# Patient Record
Sex: Female | Born: 1935 | Race: White | Hispanic: No | Marital: Single | Smoking: Former smoker
Health system: Southern US, Community
[De-identification: ages and names within clinical notes are randomized; demographics above are authoritative.]

## PROBLEM LIST (undated history)

## (undated) DIAGNOSIS — I272 Pulmonary hypertension, unspecified: Secondary | ICD-10-CM

## (undated) DIAGNOSIS — E039 Hypothyroidism, unspecified: Secondary | ICD-10-CM

## (undated) DIAGNOSIS — I4891 Unspecified atrial fibrillation: Secondary | ICD-10-CM

## (undated) DIAGNOSIS — R011 Cardiac murmur, unspecified: Secondary | ICD-10-CM

## (undated) DIAGNOSIS — M1990 Unspecified osteoarthritis, unspecified site: Secondary | ICD-10-CM

## (undated) DIAGNOSIS — R0600 Dyspnea, unspecified: Secondary | ICD-10-CM

## (undated) DIAGNOSIS — I071 Rheumatic tricuspid insufficiency: Secondary | ICD-10-CM

## (undated) DIAGNOSIS — G43909 Migraine, unspecified, not intractable, without status migrainosus: Secondary | ICD-10-CM

## (undated) DIAGNOSIS — I517 Cardiomegaly: Secondary | ICD-10-CM

## (undated) DIAGNOSIS — I679 Cerebrovascular disease, unspecified: Secondary | ICD-10-CM

## (undated) DIAGNOSIS — N183 Chronic kidney disease, stage 3 unspecified: Secondary | ICD-10-CM

## (undated) DIAGNOSIS — I5032 Chronic diastolic (congestive) heart failure: Secondary | ICD-10-CM

## (undated) DIAGNOSIS — I10 Essential (primary) hypertension: Secondary | ICD-10-CM

## (undated) DIAGNOSIS — G319 Degenerative disease of nervous system, unspecified: Secondary | ICD-10-CM

## (undated) DIAGNOSIS — I059 Rheumatic mitral valve disease, unspecified: Secondary | ICD-10-CM

## (undated) DIAGNOSIS — K219 Gastro-esophageal reflux disease without esophagitis: Secondary | ICD-10-CM

## (undated) DIAGNOSIS — R32 Unspecified urinary incontinence: Secondary | ICD-10-CM

## (undated) DIAGNOSIS — I340 Nonrheumatic mitral (valve) insufficiency: Secondary | ICD-10-CM

## (undated) DIAGNOSIS — E538 Deficiency of other specified B group vitamins: Secondary | ICD-10-CM

## (undated) DIAGNOSIS — R609 Edema, unspecified: Secondary | ICD-10-CM

## (undated) DIAGNOSIS — R2681 Unsteadiness on feet: Secondary | ICD-10-CM

## (undated) DIAGNOSIS — J449 Chronic obstructive pulmonary disease, unspecified: Secondary | ICD-10-CM

## (undated) DIAGNOSIS — I351 Nonrheumatic aortic (valve) insufficiency: Secondary | ICD-10-CM

## (undated) HISTORY — DX: Chronic kidney disease, stage 3 unspecified: N18.30

## (undated) HISTORY — DX: Rheumatic tricuspid insufficiency: I07.1

## (undated) HISTORY — DX: Edema, unspecified: R60.9

## (undated) HISTORY — DX: Chronic kidney disease, stage 3 (moderate): N18.3

## (undated) HISTORY — DX: Cardiac murmur, unspecified: R01.1

## (undated) HISTORY — DX: Unspecified osteoarthritis, unspecified site: M19.90

## (undated) HISTORY — DX: Degenerative disease of nervous system, unspecified: G31.9

## (undated) HISTORY — DX: Deficiency of other specified B group vitamins: E53.8

## (undated) HISTORY — DX: Cardiomegaly: I51.7

## (undated) HISTORY — PX: TONSILLECTOMY: SHX5217

## (undated) HISTORY — DX: Essential (primary) hypertension: I10

## (undated) HISTORY — DX: Unspecified urinary incontinence: R32

## (undated) HISTORY — DX: Migraine, unspecified, not intractable, without status migrainosus: G43.909

## (undated) HISTORY — DX: Rheumatic mitral valve disease, unspecified: I05.9

## (undated) HISTORY — DX: Unspecified atrial fibrillation: I48.91

## (undated) HISTORY — DX: Nonrheumatic aortic (valve) insufficiency: I35.1

## (undated) HISTORY — DX: Cerebrovascular disease, unspecified: I67.9

## (undated) HISTORY — DX: Hypothyroidism, unspecified: E03.9

## (undated) HISTORY — DX: Nonrheumatic mitral (valve) insufficiency: I34.0

## (undated) HISTORY — DX: Chronic obstructive pulmonary disease, unspecified: J44.9

## (undated) HISTORY — DX: Chronic diastolic (congestive) heart failure: I50.32

## (undated) HISTORY — DX: Pulmonary hypertension, unspecified: I27.20

## (undated) HISTORY — DX: Unsteadiness on feet: R26.81

## (undated) HISTORY — DX: Gastro-esophageal reflux disease without esophagitis: K21.9

## (undated) HISTORY — DX: Dyspnea, unspecified: R06.00

---

## 1995-09-07 ENCOUNTER — Encounter (INDEPENDENT_AMBULATORY_CARE_PROVIDER_SITE_OTHER): Payer: Self-pay | Admitting: *Deleted

## 1995-09-13 ENCOUNTER — Encounter (INDEPENDENT_AMBULATORY_CARE_PROVIDER_SITE_OTHER): Payer: Self-pay | Admitting: *Deleted

## 1996-03-26 ENCOUNTER — Encounter (INDEPENDENT_AMBULATORY_CARE_PROVIDER_SITE_OTHER): Payer: Self-pay | Admitting: *Deleted

## 1999-06-20 ENCOUNTER — Encounter (INDEPENDENT_AMBULATORY_CARE_PROVIDER_SITE_OTHER): Payer: Self-pay | Admitting: *Deleted

## 2000-08-10 LAB — CONVERTED CEMR LAB: Pap Smear: NORMAL

## 2002-06-20 ENCOUNTER — Encounter (INDEPENDENT_AMBULATORY_CARE_PROVIDER_SITE_OTHER): Payer: Self-pay | Admitting: *Deleted

## 2006-05-29 ENCOUNTER — Encounter: Admission: RE | Admit: 2006-05-29 | Discharge: 2006-05-29 | Payer: Self-pay | Admitting: Family Medicine

## 2007-04-05 ENCOUNTER — Encounter (INDEPENDENT_AMBULATORY_CARE_PROVIDER_SITE_OTHER): Payer: Self-pay | Admitting: *Deleted

## 2007-04-10 ENCOUNTER — Encounter (INDEPENDENT_AMBULATORY_CARE_PROVIDER_SITE_OTHER): Payer: Self-pay | Admitting: *Deleted

## 2007-08-08 ENCOUNTER — Encounter (INDEPENDENT_AMBULATORY_CARE_PROVIDER_SITE_OTHER): Payer: Self-pay | Admitting: *Deleted

## 2007-08-24 ENCOUNTER — Encounter (INDEPENDENT_AMBULATORY_CARE_PROVIDER_SITE_OTHER): Payer: Self-pay | Admitting: *Deleted

## 2007-09-10 ENCOUNTER — Encounter (INDEPENDENT_AMBULATORY_CARE_PROVIDER_SITE_OTHER): Payer: Self-pay | Admitting: *Deleted

## 2007-10-15 ENCOUNTER — Encounter (INDEPENDENT_AMBULATORY_CARE_PROVIDER_SITE_OTHER): Payer: Self-pay | Admitting: *Deleted

## 2008-02-29 ENCOUNTER — Encounter (INDEPENDENT_AMBULATORY_CARE_PROVIDER_SITE_OTHER): Payer: Self-pay | Admitting: *Deleted

## 2008-03-07 ENCOUNTER — Encounter (INDEPENDENT_AMBULATORY_CARE_PROVIDER_SITE_OTHER): Payer: Self-pay | Admitting: *Deleted

## 2008-07-25 ENCOUNTER — Ambulatory Visit: Payer: Self-pay | Admitting: *Deleted

## 2008-07-25 DIAGNOSIS — I4891 Unspecified atrial fibrillation: Secondary | ICD-10-CM | POA: Insufficient documentation

## 2008-07-25 DIAGNOSIS — I10 Essential (primary) hypertension: Secondary | ICD-10-CM

## 2008-07-25 DIAGNOSIS — G43009 Migraine without aura, not intractable, without status migrainosus: Secondary | ICD-10-CM | POA: Insufficient documentation

## 2008-07-25 DIAGNOSIS — J309 Allergic rhinitis, unspecified: Secondary | ICD-10-CM | POA: Insufficient documentation

## 2008-07-25 DIAGNOSIS — M1990 Unspecified osteoarthritis, unspecified site: Secondary | ICD-10-CM | POA: Insufficient documentation

## 2008-07-25 DIAGNOSIS — E039 Hypothyroidism, unspecified: Secondary | ICD-10-CM | POA: Insufficient documentation

## 2008-07-25 LAB — CONVERTED CEMR LAB
BUN: 21 mg/dL (ref 6–23)
CO2: 31 meq/L (ref 19–32)
Calcium: 9.6 mg/dL (ref 8.4–10.5)
Chloride: 100 meq/L (ref 96–112)
Creatinine, Ser: 1.1 mg/dL (ref 0.4–1.2)
GFR calc Af Amer: 62 mL/min
GFR calc non Af Amer: 51 mL/min
Glucose, Bld: 62 mg/dL — ABNORMAL LOW (ref 70–99)
Potassium: 4 meq/L (ref 3.5–5.1)
Sodium: 137 meq/L (ref 135–145)

## 2008-08-01 ENCOUNTER — Encounter (INDEPENDENT_AMBULATORY_CARE_PROVIDER_SITE_OTHER): Payer: Self-pay | Admitting: *Deleted

## 2008-08-14 DIAGNOSIS — R319 Hematuria, unspecified: Secondary | ICD-10-CM

## 2008-11-19 ENCOUNTER — Telehealth: Payer: Self-pay | Admitting: Internal Medicine

## 2008-12-29 ENCOUNTER — Telehealth: Payer: Self-pay | Admitting: Family Medicine

## 2009-01-02 ENCOUNTER — Telehealth: Payer: Self-pay | Admitting: Family Medicine

## 2009-01-23 ENCOUNTER — Ambulatory Visit: Payer: Self-pay | Admitting: Internal Medicine

## 2009-01-23 LAB — CONVERTED CEMR LAB: TSH: 1.88 microintl units/mL (ref 0.35–5.50)

## 2009-02-03 ENCOUNTER — Ambulatory Visit: Payer: Self-pay | Admitting: Internal Medicine

## 2009-02-04 LAB — CONVERTED CEMR LAB
Eosinophils Relative: 1.7 % (ref 0.0–5.0)
Folate: >20 ng/mL
H Pylori IgG: NEGATIVE
HCT: 46.6 % — ABNORMAL HIGH (ref 36.0–46.0)
Lymphocytes Relative: 17 % (ref 12.0–46.0)
Lymphs Abs: 1.1 10*3/uL (ref 0.7–4.0)
Monocytes Relative: 7.8 % (ref 3.0–12.0)
Platelets: 191 10*3/uL (ref 150.0–400.0)
Vitamin B-12: 213 pg/mL (ref 211–911)
WBC: 6.3 10*3/uL (ref 4.5–10.5)

## 2009-02-09 ENCOUNTER — Ambulatory Visit: Payer: Self-pay | Admitting: Internal Medicine

## 2009-02-09 DIAGNOSIS — E538 Deficiency of other specified B group vitamins: Secondary | ICD-10-CM | POA: Insufficient documentation

## 2009-02-17 ENCOUNTER — Ambulatory Visit: Payer: Self-pay | Admitting: Internal Medicine

## 2009-02-23 ENCOUNTER — Ambulatory Visit: Payer: Self-pay | Admitting: Internal Medicine

## 2009-03-02 ENCOUNTER — Ambulatory Visit: Payer: Self-pay | Admitting: Internal Medicine

## 2009-03-06 ENCOUNTER — Encounter: Payer: Self-pay | Admitting: Internal Medicine

## 2009-03-30 ENCOUNTER — Ambulatory Visit: Payer: Self-pay | Admitting: Internal Medicine

## 2009-04-13 ENCOUNTER — Ambulatory Visit: Payer: Self-pay | Admitting: Internal Medicine

## 2009-04-20 ENCOUNTER — Telehealth: Payer: Self-pay | Admitting: Internal Medicine

## 2009-04-27 ENCOUNTER — Ambulatory Visit: Payer: Self-pay | Admitting: Internal Medicine

## 2009-05-01 ENCOUNTER — Telehealth: Payer: Self-pay | Admitting: Internal Medicine

## 2009-05-11 ENCOUNTER — Telehealth: Payer: Self-pay | Admitting: Internal Medicine

## 2009-05-25 ENCOUNTER — Telehealth: Payer: Self-pay | Admitting: Internal Medicine

## 2009-05-25 ENCOUNTER — Ambulatory Visit: Payer: Self-pay | Admitting: Internal Medicine

## 2009-06-22 ENCOUNTER — Ambulatory Visit: Payer: Self-pay | Admitting: Internal Medicine

## 2009-07-24 ENCOUNTER — Ambulatory Visit: Payer: Self-pay | Admitting: Internal Medicine

## 2009-08-24 ENCOUNTER — Ambulatory Visit: Payer: Self-pay | Admitting: Internal Medicine

## 2009-08-25 LAB — CONVERTED CEMR LAB
Cholesterol: 187 mg/dL (ref 0–200)
TSH: 1.26 microintl units/mL (ref 0.35–5.50)

## 2009-12-25 ENCOUNTER — Ambulatory Visit: Payer: Self-pay | Admitting: Internal Medicine

## 2009-12-28 LAB — CONVERTED CEMR LAB
ALT: 19 units/L (ref 0–35)
AST: 29 units/L (ref 0–37)
Bilirubin, Direct: 0.2 mg/dL (ref 0.0–0.3)
Chloride: 102 meq/L (ref 96–112)
Folate: >20 ng/mL
GFR calc non Af Amer: 47.99 mL/min (ref >60)
Potassium: 4.8 meq/L (ref 3.5–5.1)
Sodium: 135 meq/L (ref 135–145)
TSH: 1.17 microintl units/mL (ref 0.35–5.50)
Total Bilirubin: 0.9 mg/dL (ref 0.3–1.2)

## 2010-03-08 ENCOUNTER — Ambulatory Visit: Payer: Self-pay | Admitting: Cardiology

## 2010-03-08 ENCOUNTER — Ambulatory Visit (HOSPITAL_COMMUNITY): Admission: RE | Admit: 2010-03-08 | Discharge: 2010-03-08 | Payer: Self-pay | Admitting: Cardiology

## 2010-04-02 ENCOUNTER — Telehealth: Payer: Self-pay | Admitting: Internal Medicine

## 2010-04-30 ENCOUNTER — Encounter: Payer: Self-pay | Admitting: Internal Medicine

## 2010-05-19 ENCOUNTER — Ambulatory Visit: Payer: Self-pay | Admitting: Internal Medicine

## 2010-06-28 ENCOUNTER — Other Ambulatory Visit: Payer: Self-pay | Admitting: Internal Medicine

## 2010-06-28 ENCOUNTER — Ambulatory Visit
Admission: RE | Admit: 2010-06-28 | Discharge: 2010-06-28 | Payer: Self-pay | Source: Home / Self Care | Attending: Internal Medicine | Admitting: Internal Medicine

## 2010-06-28 LAB — HEPATIC FUNCTION PANEL
ALT: 19 U/L (ref 0–35)
AST: 33 U/L (ref 0–37)
Albumin: 3.9 g/dL (ref 3.5–5.2)
Alkaline Phosphatase: 71 U/L (ref 39–117)
Bilirubin, Direct: 0.2 mg/dL (ref 0.0–0.3)
Total Bilirubin: 0.9 mg/dL (ref 0.3–1.2)
Total Protein: 6.8 g/dL (ref 6.0–8.3)

## 2010-06-28 LAB — URINALYSIS, ROUTINE W REFLEX MICROSCOPIC
Bilirubin Urine: NEGATIVE
Hemoglobin, Urine: NEGATIVE
Ketones, ur: NEGATIVE
Leukocytes, UA: NEGATIVE
Nitrite: NEGATIVE
Specific Gravity, Urine: 1.01 (ref 1.000–1.030)
Urine Glucose: NEGATIVE
Urobilinogen, UA: 0.2 (ref 0.0–1.0)
pH: 7 (ref 5.0–8.0)

## 2010-06-28 LAB — CBC WITH DIFFERENTIAL/PLATELET
Basophils Absolute: 0 10*3/uL (ref 0.0–0.1)
Basophils Relative: 0.4 % (ref 0.0–3.0)
Eosinophils Absolute: 0.1 10*3/uL (ref 0.0–0.7)
Eosinophils Relative: 1.3 % (ref 0.0–5.0)
HCT: 45.6 % (ref 36.0–46.0)
Hemoglobin: 15.3 g/dL — ABNORMAL HIGH (ref 12.0–15.0)
Lymphocytes Relative: 15.7 % (ref 12.0–46.0)
Lymphs Abs: 1.1 10*3/uL (ref 0.7–4.0)
MCHC: 33.6 g/dL (ref 30.0–36.0)
MCV: 91.8 fl (ref 78.0–100.0)
Monocytes Absolute: 0.4 10*3/uL (ref 0.1–1.0)
Monocytes Relative: 6.3 % (ref 3.0–12.0)
Neutro Abs: 5.1 10*3/uL (ref 1.4–7.7)
Neutrophils Relative %: 76.3 % (ref 43.0–77.0)
Platelets: 207 10*3/uL (ref 150.0–400.0)
RBC: 4.97 Mil/uL (ref 3.87–5.11)
RDW: 14.9 % — ABNORMAL HIGH (ref 11.5–14.6)
WBC: 6.7 10*3/uL (ref 4.5–10.5)

## 2010-06-28 LAB — TSH: TSH: 1.81 u[IU]/mL (ref 0.35–5.50)

## 2010-06-28 LAB — LIPID PANEL
Cholesterol: 190 mg/dL (ref 0–200)
HDL: 84.1 mg/dL (ref >39.00)
LDL Cholesterol: 92 mg/dL (ref 0–99)
Total CHOL/HDL Ratio: 2
Triglycerides: 71 mg/dL (ref 0.0–149.0)
VLDL: 14.2 mg/dL (ref 0.0–40.0)

## 2010-06-28 LAB — BASIC METABOLIC PANEL
BUN: 22 mg/dL (ref 6–23)
CO2: 29 mEq/L (ref 19–32)
Calcium: 9.5 mg/dL (ref 8.4–10.5)
Chloride: 95 mEq/L — ABNORMAL LOW (ref 96–112)
Creatinine, Ser: 1 mg/dL (ref 0.4–1.2)
GFR: 54.97 mL/min — ABNORMAL LOW (ref >60.00)
Glucose, Bld: 73 mg/dL (ref 70–99)
Potassium: 4.2 mEq/L (ref 3.5–5.1)
Sodium: 134 mEq/L — ABNORMAL LOW (ref 135–145)

## 2010-07-13 ENCOUNTER — Telehealth: Payer: Self-pay | Admitting: Internal Medicine

## 2010-07-15 NOTE — Assessment & Plan Note (Signed)
Summary: 4-6 MTH FU  STC   Vital Signs:  Patient profile:   77 year old female Height:      61 inches (154.94 cm) Weight:      121.8 pounds (55.36 kg) O2 Sat:      97 % on Room air Temp:     98.3 degrees F (36.83 degrees C) oral Pulse rate:   72 / minute BP sitting:   130 / 80  (left arm) Cuff size:   regular  Vitals Entered By: Orlan Leavens (December 25, 2009 10:17 AM)  O2 Flow:  Room air CC: 4 month follow-up Is Patient Diabetic? No Pain Assessment Patient in pain? no        Primary Care Provider:  Newt Lukes MD  CC:  4 month follow-up.  History of Present Illness:  still feels fatigued but sleeping better - labs from last visit reviewed -  HTN - says BP better at home since off atenolol - SBP 110-120s at home, never 140 or higher would like to also stop amlodipine as feels this may be causing fatigue symptoms  denies CAD or CVA - no dyslipidemia (despite med record) per pt -  would prefer not to have med tx for this - reviewed ASD risks needs lipids checked today  B12 defic - labs reviewed stopped montly injections this spring 08/2009 last injection -  no numbness, +fatigue as noted  Afib - no palpitaions, SOB or CP no adv symptoms off Bbloc has cards app every 6 months  hypothyroid - reports compliance with ongoing medical treatment and no changes in medication dose or frequency. denies adverse side effects related to current therapy. no weight changes or bowel change   Clinical Review Panels:  Lipid Management   Cholesterol:  187 (08/24/2009)   LDL (bad choesterol):  81 (08/24/2009)   HDL (good cholesterol):  92.30 (08/24/2009)  CBC   WBC:  6.3 (02/03/2009)   RBC:  5.01 (02/03/2009)   Hgb:  15.8 (02/03/2009)   Hct:  46.6 (02/03/2009)   Platelets:  191.0 (02/03/2009)   MCV  93.1 (02/03/2009)   MCHC  33.8 (02/03/2009)   RDW  13.0 (02/03/2009)   PMN:  72.8 (02/03/2009)   Lymphs:  17.0 (02/03/2009)   Monos:  7.8 (02/03/2009)   Eosinophils:   1.7 (02/03/2009)   Basophil:  0.7 (02/03/2009)  Complete Metabolic Panel   Glucose:  62 (07/25/2008)   Sodium:  137 (07/25/2008)   Potassium:  4.0 (07/25/2008)   Chloride:  100 (07/25/2008)   CO2:  31 (07/25/2008)   BUN:  21 (07/25/2008)   Creatinine:  1.1 (07/25/2008)   Calcium:  9.6 (07/25/2008)   Current Medications (verified): 1)  Digoxin 0.125 Mg Tabs (Digoxin) .... Take 1 Tablet By Mouth Once A Day 2)  Levothyroxine Sodium 50 Mcg Tabs (Levothyroxine Sodium) .Marland Kitchen.. 1 Tab By Mouth Daily 3)  Bayer Low Strength 81 Mg Tbec (Aspirin) .... Take 1 Tablet By Mouth Once A Day 4)  Furosemide 20 Mg Tabs (Furosemide) .... One Tab By Mouth Once Daily As Needed 5)  Lisinopril-Hydrochlorothiazide 10-12.5 Mg Tabs (Lisinopril-Hydrochlorothiazide) .Marland Kitchen.. 1 By Mouth Once Daily  Allergies (verified): No Known Drug Allergies  Past History:  Past Medical History: Patient is Jehovah Witness - no blood products Hypertension Hypothyroidism Osteoarthritis  migraines Allergic rhinitis a-fib - no coumadin murmur last stress test was 9/09 with no blockages reported per patient  MD roster: cards -brackbill  Review of Systems  The patient denies fever, weight loss,  chest pain, and syncope.    Physical Exam  General:  alert, well-developed, well-nourished, and cooperative to examination.    Lungs:  normal respiratory effort, no intercostal retractions or use of accessory muscles; normal breath sounds bilaterally - no crackles and no wheezes.    Heart:  normal rate , irregular rhythm, no murmur, and no rub. BLE without edema. Psych:  Oriented X3, memory intact for recent and remote, normally interactive, good eye contact, not anxious appearing, not depressed appearing, and not agitated.      Impression & Recommendations:  Problem # 1:  B12 DEFICIENCY (ICD-266.2) no longer on shots last 3 months - check now consider oral replacment as needed  Orders: TLB-B12 + Folate Pnl  ([id number])  Problem # 2:  ALLERGIC RHINITIS (ICD-477.9) cont PND - try antihist tx - pt agrees Her updated medication list for this problem includes:    Loratadine 10 Mg Tabs (Loratadine) .Marland Kitchen... 1 by mouth once daily x 7 days, then as needed for allergy symptoms  Problem # 3:  HYPOTHYROIDISM (ICD-244.9)  Her updated medication list for this problem includes:    Levothyroxine Sodium 50 Mcg Tabs (Levothyroxine sodium) .Marland Kitchen... 1 tab by mouth daily  Orders: TLB-TSH (Thyroid Stimulating Hormone) (84443-TSH)  Labs Reviewed: TSH: 1.26 (08/24/2009)    Chol: 187 (08/24/2009)   HDL: 92.30 (08/24/2009)   LDL: 81 (08/24/2009)   TG: 68.0 (08/24/2009)  Problem # 4:  HYPERTENSION (ICD-401.9)  The following medications were removed from the medication list:    Amlodipine Besylate 5 Mg Tabs (Amlodipine besylate) .Marland Kitchen... 1/2 tab by mouth once daily (may hold unless bp over 130/85) Her updated medication list for this problem includes:    Furosemide 20 Mg Tabs (Furosemide) ..... One tab by mouth once daily as needed    Lisinopril-hydrochlorothiazide 10-12.5 Mg Tabs (Lisinopril-hydrochlorothiazide) .Marland Kitchen... 1 by mouth once daily  Orders: TLB-BMP (Basic Metabolic Panel-BMET) (80048-METABOL)  BP today: 130/80 Prior BP: 118/84 (08/24/2009)  Labs Reviewed: K+: 4.0 (07/25/2008) Creat: : 1.1 (07/25/2008)   Chol: 187 (08/24/2009)   HDL: 92.30 (08/24/2009)   LDL: 81 (08/24/2009)   TG: 68.0 (08/24/2009)  Problem # 5:  ATRIAL FIBRILLATION (ICD-427.31)  The following medications were removed from the medication list:    Amlodipine Besylate 5 Mg Tabs (Amlodipine besylate) .Marland Kitchen... 1/2 tab by mouth once daily (may hold unless bp over 130/85) Her updated medication list for this problem includes:    Digoxin 0.125 Mg Tabs (Digoxin) .Marland Kitchen... Take 1 tablet by mouth once a day    Bayer Low Strength 81 Mg Tbec (Aspirin) .Marland Kitchen... Take 1 tablet by mouth once a day  f/u as planned with crads -brackbill - every 6 months  - stable  Complete Medication List: 1)  Digoxin 0.125 Mg Tabs (Digoxin) .... Take 1 tablet by mouth once a day 2)  Levothyroxine Sodium 50 Mcg Tabs (Levothyroxine sodium) .Marland Kitchen.. 1 tab by mouth daily 3)  Bayer Low Strength 81 Mg Tbec (Aspirin) .... Take 1 tablet by mouth once a day 4)  Furosemide 20 Mg Tabs (Furosemide) .... One tab by mouth once daily as needed 5)  Lisinopril-hydrochlorothiazide 10-12.5 Mg Tabs (Lisinopril-hydrochlorothiazide) .Marland Kitchen.. 1 by mouth once daily 6)  Loratadine 10 Mg Tabs (Loratadine) .Marland Kitchen.. 1 by mouth once daily x 7 days, then as needed for allergy symptoms  Other Orders: TLB-Hepatic/Liver Function Pnl (80076-HEPATIC)  Patient Instructions: 1)  it was good to see you today. 2)  try generic claritin for your allergy symptoms - 3)  no other  medication changes- 4)  test(s) ordered today - your results will be posted on the phone tree for review in 48-72 hours from the time of test completion; call [phone] and enter your 9 digit MRN (listed above on this page, just below your name); if any changes need to be made or there are abnormal results, you will be contacted directly.  5)  Please schedule a follow-up appointment in 4-65months, sooner if problems.

## 2010-07-15 NOTE — Progress Notes (Signed)
Summary: levothyroxine  Phone Note Refill Request Message from:  Fax from Pharmacy on April 02, 2010 8:24 AM  Refills Requested: Medication #1:  LEVOTHYROXINE SODIUM 50 MCG TABS 1 tab by mouth daily [facility]   Method Requested: Electronic Initial call taken by: Orlan Leavens RMA,  April 02, 2010 8:24 AM    Prescriptions: LEVOTHYROXINE SODIUM 50 MCG TABS (LEVOTHYROXINE SODIUM) 1 tab by mouth daily  #30 x 3   Entered by:   Orlan Leavens RMA   Authorized by:   Newt Lukes MD   Signed by:   Orlan Leavens RMA on 04/02/2010   Method used:   Electronically to        [facility] [address] (retail)       [address]       [place], [place]  [address]       Ph: [phone]       Fax: [phone]   RxID:   [id number]

## 2010-07-15 NOTE — Assessment & Plan Note (Signed)
Summary: SINUS PROBLEM   STC   Vital Signs:  Patient profile:   77 year old female Height:      61 inches (154.94 cm) Weight:      122.8 pounds (55.82 kg) BMI:     23.29 O2 Sat:      97 % on Room air Temp:     98.3 degrees F (36.83 degrees C) oral Pulse rate:   90 / minute BP sitting:   112 / 82  (left arm) Cuff size:   regular  Vitals Entered By: Orlan Leavens RMA (May 19, 2010 11:25 AM)  O2 Flow:  Room air CC: Sinus problem Is Patient Diabetic? No Pain Assessment Patient in pain? no        Primary Care Provider:  Newt Lukes MD  CC:  Sinus problem.  History of Present Illness: here for sinus problems - onset 4 days ago, course progressive - started as ST and sneezing - yellow/green nasal discharge +chills but no fever now frontal sinus pressure and HA x 2 days no eye pain, no ear pain or fullness symptoms not improved with robitussin otc use  also reviewed chronic med issues: HTN - says BP better at home since off atenolol - SBP 110-120s at home, never 140 or higher would like to also stop amlodipine as feels this may be causing fatigue symptoms  denies CAD or CVA - no dyslipidemia (despite med record) per pt -  would prefer not to have med tx for this - reviewed ASD risks needs lipids checked today  B12 defic - labs reviewed stopped montly injections spring 08/2009 last injection -  no numbness, +fatigue as noted  Afib - no palpitaions, SOB or CP no adv symptoms off Bbloc has cards app every 6 months  hypothyroid - reports compliance with ongoing medical treatment and no changes in medication dose or frequency. denies adverse side effects related to current therapy. no weight changes or bowel change   Current Medications (verified): 1)  Digoxin 0.125 Mg Tabs (Digoxin) .... Take 1 Tablet By Mouth Once A Day 2)  Levothyroxine Sodium 50 Mcg Tabs (Levothyroxine Sodium) .Marland Kitchen.. 1 Tab By Mouth Daily 3)  Bayer Low Strength 81 Mg Tbec (Aspirin) ....  Take 1 Tablet By Mouth Once A Day 4)  Furosemide 20 Mg Tabs (Furosemide) .... One Tab By Mouth Once Daily As Needed 5)  Loratadine 10 Mg Tabs (Loratadine) .Marland Kitchen.. 1 By Mouth Once Daily X 7 Days, Then As Needed For Allergy Symptoms 6)  Lisinopril 10 Mg Tabs (Lisinopril) .... Take 1 By Mouth Once Daily  Allergies (verified): No Known Drug Allergies  Past History:  Past Medical History: Patient is Jehovah Witness - no blood products Hypertension Hypothyroidism Osteoarthritis  migraines Allergic rhinitis a-fib - no coumadin  murmur last stress test was 9/09 with no blockages reported per patient  MD roster: cards -brackbill  Review of Systems       The patient complains of dyspnea on exertion.  The patient denies weight loss, vision loss, decreased hearing, hoarseness, chest pain, and hemoptysis.    Physical Exam  General:  alert, well-developed, well-nourished, and cooperative to examination.   mild ill Head:  tender to gentle pressure over frontal sinuses Eyes:  vision grossly intact; pupils equal, round and reactive to light.  conjunctiva and lids normal.    Ears:  normal pinnae bilaterally, without erythema, swelling, or tenderness to palpation. TMs clear, without effusion, or cerumen impaction. Hearing grossly normal bilaterally  Mouth:  teeth and gums in good repair; mucous membranes moist, without lesions or ulcers. oropharynx clear without exudate, mod erythema. +PND Lungs:  normal respiratory effort, no intercostal retractions or use of accessory muscles; normal breath sounds bilaterally - no crackles and no wheezes.    Heart:  normal rate, regular rhythm, no murmur, and no rub. BLE without edema. Neurologic:  alert & oriented X3 and cranial nerves II-XII symetrically intact.  strength normal in all extremities, sensation intact to light touch, and gait normal. speech fluent without dysarthria or aphasia; follows commands with good comprehension.    Impression &  Recommendations:  Problem # 1:  SINUSITIS, FRONTAL, ACUTE (ICD-461.1)  Her updated medication list for this problem includes:    Augmentin 875-125 Mg Tabs (Amoxicillin-pot clavulanate) .Marland Kitchen... 1 by mouth two times a day x 7days    Fluticasone Propionate 50 Mcg/act Susp (Fluticasone propionate) .Marland Kitchen... 2 sprays each nostril  every morning for allergy and sinus control    Robitussin Dm 100-10 Mg/40ml Syrp (Dextromethorphan-guaifenesin) .Marland Kitchen... As directed for sough symptoms  Instructed on treatment. Call if symptoms persist or worsen.   Complete Medication List: 1)  Digoxin 0.125 Mg Tabs (Digoxin) .... Take 1 tablet by mouth once a day 2)  Levothyroxine Sodium 50 Mcg Tabs (Levothyroxine sodium) .Marland Kitchen.. 1 tab by mouth daily 3)  Bayer Low Strength 81 Mg Tbec (Aspirin) .... Take 1 tablet by mouth once a day 4)  Furosemide 20 Mg Tabs (Furosemide) .... One tab by mouth once daily as needed 5)  Loratadine 10 Mg Tabs (Loratadine) .Marland Kitchen.. 1 by mouth once daily x 7 days, then as needed for allergy symptoms 6)  Lisinopril 10 Mg Tabs (Lisinopril) .... Take 1 by mouth once daily 7)  Augmentin 875-125 Mg Tabs (Amoxicillin-pot clavulanate) .Marland Kitchen.. 1 by mouth two times a day x 7days 8)  Fluticasone Propionate 50 Mcg/act Susp (Fluticasone propionate) .... 2 sprays each nostril  every morning for allergy and sinus control 9)  Robitussin Dm 100-10 Mg/82ml Syrp (Dextromethorphan-guaifenesin) .... As directed for sough symptoms  Patient Instructions: 1)  it was good to see you today. 2)  Augmentin antibiotics and Flonase (generic) nasal spray for sinus symptoms as discussed -your prescriptions have been given to you to submit to your pharmacy. Please take as directed. Contact our office if you believe you're having problems with the medication(s).  3)  Get plenty of rest, drink lots of clear liquids, and use Tylenol or Ibuprofen for fever and comfort and Robitussin for cough. Return in 7-10 days if you're not better:sooner if  you're feeling worse. Prescriptions: FLUTICASONE PROPIONATE 50 MCG/ACT SUSP (FLUTICASONE PROPIONATE) 2 sprays each nostril  every morning for allergy and sinus control  #1 x 1   Entered and Authorized by:   Newt Lukes MD   Signed by:   Newt Lukes MD on 05/19/2010   Method used:   Print then Give to Patient   RxID:   [id number] AUGMENTIN 875-125 MG TABS (AMOXICILLIN-POT CLAVULANATE) 1 by mouth two times a day x 7days  #14 x 0   Entered and Authorized by:   Newt Lukes MD   Signed by:   Newt Lukes MD on 05/19/2010   Method used:   Print then Give to Patient   RxID:   [id number]    Orders Added: 1)  Est. Patient Level IV [84696]

## 2010-07-15 NOTE — Assessment & Plan Note (Signed)
Summary: 4-6 month follow up-lb, cpx   Vital Signs:  Patient profile:   77 year old female Height:      61 inches (154.94 cm) Weight:      122.8 pounds (55.82 kg) O2 Sat:      95 % on Room air Temp:     97.9 degrees F (36.61 degrees C) oral Pulse rate:   80 / minute BP sitting:   130 / 82  (left arm) Cuff size:   regular  Vitals Entered By: Orlan Leavens RMA (June 28, 2010 10:21 AM)  O2 Flow:  Room air CC: 4-6 month follow-up, cpx Is Patient Diabetic? No Pain Assessment Patient in pain? no       Vision Screening:Left eye w/o correction: 20 / 30 Right Eye w/o correction: 20 / 40 Both eyes w/o correction:  20/ 30  Color vision testing: normal      Vision Entered By: Newt Lukes MD (June 28, 2010 10:49 AM)   Primary Care Provider:  Newt Lukes MD  CC:  4-6 month follow-up and cpx.  History of Present Illness: patient is here today for annual physical. Patient feels well and has no complaints.  requests complete lab panel  I have personally reviewed and have noted 1.   The patient's medical and social history 2.   Their use of alcohol, tobacco or illicit drugs 3.   Their current medications and supplements 4.   The patient's functional ability including ADL's, fall risks, home safety risks and hearing or visual impairment. 5.   Diet and physical activities 6.   Evidence for depression or mood disorders  The patients weight, height, BMI and visual acuity have been recorded in the chart I have made referrals, counseling and provided education to the patient based review of the above and I have provided the pt with a written personalized care plan for preventive services.    also reviewed chronic med issues:  HTN - feels BP better at home since off atenolol - SBP 110-120s at home, never 140 or higher also stopped amlodipine as felt this caused fatigue symptoms  tol ACEI w/o adv effects denies CAD or CVA - would prefer not to have any med tx for  this - reviewed ASD risks  hx B12 defic - labs reviewed stopped montly injections spring 08/2009 last injection -  no numbness, +fatigue as noted  Afib - no palpitaions, SOB or CP no adv symptoms off Bbloc has cards app every 6-12 months  hypothyroid - reports compliance with ongoing medical treatment and no changes in medication dose or frequency. denies adverse side effects related to current therapy. no weight changes or bowel change   Preventive Screening-Counseling & Management  Alcohol-Tobacco     Alcohol drinks/day: 1     Alcohol type: wine     Alcohol Counseling: not indicated; use of alcohol is not excessive or problematic     Smoking Status: quit     Year Quit: 1953     Pack years: 3 years  1 pack daily     Passive Smoke Exposure: no     Tobacco Counseling: to remain off tobacco products  Caffeine-Diet-Exercise     Diet Counseling: not indicated; diet is assessed to be healthy     Does Patient Exercise: no     Exercise Counseling: to improve exercise regimen     Depression Counseling: not indicated; screening negative for depression  Safety-Violence-Falls     Seat Belt Counseling: not  indicated; patient wears seat belts     Helmet Counseling: not applicable     Violence Counseling: not indicated; no violence risk noted     Fall Risk Counseling: not indicated; no significant falls noted  Clinical Review Panels:  Prevention   [facility]  Assessment: BIRADS 1. (08/14/2009)   Last Pap Smear:  Normal (08/10/2000)  Immunizations   Last Tetanus Booster:  Td (07/25/2008)  Lipid Management   Cholesterol:  187 (08/24/2009)   LDL (bad choesterol):  81 (08/24/2009)   HDL (good cholesterol):  92.30 (08/24/2009)  CBC   WBC:  6.3 (02/03/2009)   RBC:  5.01 (02/03/2009)   Hgb:  15.8 (02/03/2009)   Hct:  46.6 (02/03/2009)   Platelets:  191.0 (02/03/2009)   MCV  93.1 (02/03/2009)   MCHC   33.8 (02/03/2009)   RDW  13.0 (02/03/2009)   PMN:  72.8 (02/03/2009)   Lymphs:  17.0 (02/03/2009)   Monos:  7.8 (02/03/2009)   Eosinophils:  1.7 (02/03/2009)   Basophil:  0.7 (02/03/2009)  Complete Metabolic Panel   Glucose:  92 (12/25/2009)   Sodium:  135 (12/25/2009)   Potassium:  4.8 (12/25/2009)   Chloride:  102 (12/25/2009)   CO2:  30 (12/25/2009)   BUN:  28 (12/25/2009)   Creatinine:  1.2 (12/25/2009)   Albumin:  4.2 (12/25/2009)   Total Protein:  7.1 (12/25/2009)   Calcium:  9.9 (12/25/2009)   Total Bili:  0.9 (12/25/2009)   Alk Phos:  66 (12/25/2009)   SGPT (ALT):  19 (12/25/2009)   SGOT (AST):  29 (12/25/2009)   Current Medications (verified): 1)  Digoxin 0.125 Mg Tabs (Digoxin) .... Take 1 Tablet By Mouth Once A Day 2)  Levothyroxine Sodium 50 Mcg Tabs (Levothyroxine Sodium) .Marland Kitchen.. 1 Tab By Mouth Daily 3)  Bayer Low Strength 81 Mg Tbec (Aspirin) .... Take 1 Tablet By Mouth Once A Day 4)  Furosemide 20 Mg Tabs (Furosemide) .... One Tab By Mouth Once Daily As Needed 5)  Loratadine 10 Mg Tabs (Loratadine) .Marland Kitchen.. 1 By Mouth Once Daily X 7 Days, Then As Needed For Allergy Symptoms 6)  Lisinopril 10 Mg Tabs (Lisinopril) .... Take 1 By Mouth Once Daily 7)  Fluticasone Propionate 50 Mcg/act Susp (Fluticasone Propionate) .... 2 Sprays Each Nostril  Every Morning For Allergy and Sinus Control 8)  Robitussin Dm 100-10 Mg/13ml Syrp (Dextromethorphan-Guaifenesin) .... As Directed For Sough Symptoms  Allergies (verified): No Known Drug Allergies  Past History:  Past medical, surgical, family and social histories (including risk factors) reviewed, and no changes noted (except as noted below).  Past Medical History: Patient is Jehovah Witness - no blood products Hypertension Hypothyroidism Osteoarthritis  migraines Allergic rhinitis a-fib - no coumadin  murmur last stress test was 9/09 with no blockages reported per patient B12 defic, hx - off shots 08/2009, normal level  09/2009  MD roster: cards -brackbill  Past Surgical History: Reviewed history from 07/25/2008 and no changes required. Tonsillectomy  Family History: Reviewed history from 07/25/2008 and no changes required. Family History of Arthritis Family History High cholesterol Family History Hypertension Family History of  CAD Family History of  stroke  Social History: Reviewed history from 07/25/2008 and no changes required. 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         +nausea and bloating after meals, not using any otc meds - otherwise, see HPI above. I have reviewed all other systems and they were negative.   Physical Exam  General:  alert, well-developed, well-nourished, and cooperative to examination.   Head:  Normocephalic and atraumatic without obvious abnormalities. No apparent alopecia or balding. Eyes:  vision grossly intact; pupils equal, round and reactive to light.  conjunctiva and lids normal.    Ears:  normal pinnae bilaterally, without erythema, swelling, or tenderness to palpation. TMs clear, without effusion, or cerumen impaction. Hearing grossly normal bilaterally  Mouth:  teeth and gums in good repair; mucous membranes moist, without lesions or ulcers. oropharynx clear without exudate, no erythema. Neck:  supple, full ROM, no masses, no thyromegaly; no thyroid nodules or tenderness. no JVD or carotid bruits.   Lungs:  normal respiratory effort, no intercostal retractions or use of accessory muscles; normal breath sounds bilaterally - no crackles and no wheezes.    Heart:  normal rate, regular rhythm, no murmur, and no rub. BLE without edema. Abdomen:  soft, non-tender, normal bowel sounds, no distention; no masses and no appreciable hepatomegaly or splenomegaly.    Genitalia:  defer Msk:  no gross deformities Neurologic:  alert & oriented X3 and cranial nerves II-XII symetrically intact.  strength normal in all extremities, sensation intact to light touch, and gait normal. speech fluent without dysarthria or aphasia; follows commands with good comprehension.  Skin:  no rashes, vesicles, ulcers, or erythema. Psych:  Oriented X3, memory intact for recent and remote, normally interactive, good eye contact, not anxious appearing, not depressed appearing, and not agitated.      Impression & Recommendations:  Problem # 1:  PREVENTIVE HEALTH CARE (ICD-V70.0)  Patient has been counseled on age-appropriate routine health concerns for screening and prevention. These are reviewed and up-to-date. Immunizations are up-to-date or declined. Labs ordered and will be reviewed.   Orders: TLB-Lipid Panel (80061-LIPID) TLB-CBC Platelet - w/Differential (85025-CBCD) TLB-BMP (Basic Metabolic Panel-BMET) (80048-METABOL) TLB-Hepatic/Liver Function Pnl (80076-HEPATIC) TLB-TSH (Thyroid Stimulating Hormone) (84443-TSH) TLB-Udip w/ Micro (81001-URINE)  Problem # 2:  ATRIAL FIBRILLATION (ICD-427.31)  Her updated medication list for this problem includes:    Digoxin 0.125 Mg Tabs (Digoxin) .Marland Kitchen... Take 1 tablet by mouth once a day    Bayer Low Strength 81 Mg Tbec (Aspirin) .Marland Kitchen... Take 1 tablet by mouth once a day  f/u as planned with crads -brackbill - every 6 months - stable  Problem # 3:  HYPERTENSION (ICD-401.9)  no longer on atenolol or amlodipine due to fatigue - cont current meds Her updated medication list for this problem includes:    Furosemide 20 Mg Tabs (Furosemide) ..... One tab by mouth once daily as needed    Lisinopril 10 Mg Tabs (Lisinopril) .Marland Kitchen... Take 1 by mouth once daily  BP today: 130/82 Prior BP: 112/82 (05/19/2010)  Labs Reviewed: K+: 4.8 (12/25/2009) Creat: : 1.2 (12/25/2009)   Chol: 187 (08/24/2009)   HDL: 92.30 (08/24/2009)   LDL: 81  (08/24/2009)   TG: 68.0 (08/24/2009)  Orders: TLB-BMP (Basic Metabolic Panel-BMET) (80048-METABOL)  Problem # 4:  GERD (ICD-530.81)  start PPI and check labs today Her updated medication list for this problem includes:    Omeprazole 20 Mg Cpdr (Omeprazole) .Marland Kitchen... 1 by mouth once daily  Labs Reviewed: Hgb: 15.8 (02/03/2009)   Hct: 46.6 (02/03/2009)  Orders: TLB-CBC Platelet - w/Differential (85025-CBCD) Prescription Created Electronically [id number])  Problem # 5:  HYPOTHYROIDISM (  ICD-244.9)  Her updated medication list for this problem includes:    Levothyroxine Sodium 50 Mcg Tabs (Levothyroxine sodium) .Marland Kitchen... 1 tab by mouth daily  Labs Reviewed: TSH: 1.17 (12/25/2009)    Chol: 187 (08/24/2009)   HDL: 92.30 (08/24/2009)   LDL: 81 (08/24/2009)   TG: 68.0 (08/24/2009)  Orders: TLB-TSH (Thyroid Stimulating Hormone) (84443-TSH)  Complete Medication List: 1)  Digoxin 0.125 Mg Tabs (Digoxin) .... Take 1 tablet by mouth once a day 2)  Levothyroxine Sodium 50 Mcg Tabs (Levothyroxine sodium) .Marland Kitchen.. 1 tab by mouth daily 3)  Bayer Low Strength 81 Mg Tbec (Aspirin) .... Take 1 tablet by mouth once a day 4)  Furosemide 20 Mg Tabs (Furosemide) .... One tab by mouth once daily as needed 5)  Loratadine 10 Mg Tabs (Loratadine) .Marland Kitchen.. 1 by mouth once daily x 7 days, then as needed for allergy symptoms 6)  Lisinopril 10 Mg Tabs (Lisinopril) .... Take 1 by mouth once daily 7)  Fluticasone Propionate 50 Mcg/act Susp (Fluticasone propionate) .... 2 sprays each nostril  every morning for allergy and sinus control 8)  Robitussin Dm 100-10 Mg/42ml Syrp (Dextromethorphan-guaifenesin) .... As directed for sough symptoms 9)  Omeprazole 20 Mg Cpdr (Omeprazole) .Marland Kitchen.. 1 by mouth once daily  Patient Instructions: 1)  it was good to see you today. 2)  test(s) ordered today - your results will be  mailed to you after review in 48-72 hours from the time of test completion; if any changes need to be made or there are  abnormal results, you will be contacted directly.  3)  start omeprazole for stomach symptoms - your prescription has been electronically submitted to your pharmacy. Please take as directed. Contact our office if you believe you're having problems with the medication(s).  4)  no other medication changes 5)  I have provided you with a copy of your personalized plan for preventive services. Please take the time to review along with your updated medication list.  6)  Please schedule a follow-up appointment in 6 months to follow thryoid and watch blood pressure, call sooner if problems.  Prescriptions: OMEPRAZOLE 20 MG CPDR (OMEPRAZOLE) 1 by mouth once daily  #30 x 6   Entered and Authorized by:   Newt Lukes MD   Signed by:   Newt Lukes MD on 06/28/2010   Method used:   [facility]        [facility]       [address]       [place], [place]  [address]       Ph: [phone]       Fax: [phone]   RxID:   [id number]    Orders Added: 1)  TLB-Lipid Panel [80061-LIPID] 2)  TLB-CBC Platelet - w/Differential [85025-CBCD] 3)  TLB-BMP (Basic Metabolic Panel-BMET) [80048-METABOL] 4)  TLB-Hepatic/Liver Function Pnl [80076-HEPATIC] 5)  TLB-TSH (Thyroid Stimulating Hormone) [84443-TSH] 6)  TLB-Udip w/ Micro [81001-URINE] 7)  Est. Patient 65& > [99397] 8)  Est. Patient Level IV [84132] 9)  Prescription Created Electronically [id number]

## 2010-07-15 NOTE — Letter (Signed)
Summary: [facility]  [facility]   Imported By: Lennie Odor 05/19/2010 15:32:48  _____________________________________________________________________  External Attachment:    Type:   Image     Comment:   External Document

## 2010-07-15 NOTE — Assessment & Plan Note (Signed)
Summary: 6 MTH FU  # b12 inj also---stc  STC   Vital Signs:  Patient profile:   77 year old female Height:      61 inches (154.94 cm) Weight:      124.8 pounds (56.73 kg) O2 Sat:      99 % on Room air Temp:     98.0 degrees F (36.67 degrees C) oral Pulse rate:   78 / minute BP sitting:   118 / 84  (left arm) Cuff size:   regular  Vitals Entered By: Orlan Leavens (August 24, 2009 10:32 AM)  O2 Flow:  Room air CC: 6 month follow-up/ B12 inj. Also want to clarify dosage on Lisinopril been taking 10/12.5 but our list its 20/12.5 Is Patient Diabetic? No Pain Assessment Patient in pain? no        Primary Care Provider:  Newt Lukes MD  CC:  6 month follow-up/ B12 inj. Also want to clarify dosage on Lisinopril been taking 10/12.5 but our list its 20/12.5.  History of Present Illness:  still feels fatigued but sleeping better - labs from last visit reviewed -  HTN - says BP better at home since off atenolol - SBP 110-120s at home, never 140 or higher would like to also stop amlodipine as feels this may be causing fatigue symptoms  denies CAD or CVA - no dyslipidemia (despite med record) per pt -  would prefer not to have med tx for this - reviewed ASD risks needs lipids checked today  B12 defic - labs reviewed now on injections monthly -  no numbness, +fatigue as noted  Afib - no palpitaions, SOB or CP no adv symptoms off Bbloc has cards appt in 2 weeks    Clinical Review Panels:  Prevention   [facility]  Assessment: BIRADS 1. (08/14/2009)   Last Pap Smear:  Normal (08/10/2000)  CBC   WBC:  6.3 (02/03/2009)   RBC:  5.01 (02/03/2009)   Hgb:  15.8 (02/03/2009)   Hct:  46.6 (02/03/2009)   Platelets:  191.0 (02/03/2009)   MCV  93.1 (02/03/2009)   MCHC  33.8 (02/03/2009)   RDW  13.0 (02/03/2009)   PMN:  72.8 (02/03/2009)   Lymphs:  17.0 (02/03/2009)   Monos:  7.8 (02/03/2009)  Eosinophils:  1.7 (02/03/2009)   Basophil:  0.7 (02/03/2009)  Complete Metabolic Panel   Glucose:  62 (07/25/2008)   Sodium:  137 (07/25/2008)   Potassium:  4.0 (07/25/2008)   Chloride:  100 (07/25/2008)   CO2:  31 (07/25/2008)   BUN:  21 (07/25/2008)   Creatinine:  1.1 (07/25/2008)   Calcium:  9.6 (07/25/2008)   Current Medications (verified): 1)  Digoxin 0.125 Mg Tabs (Digoxin) .... Take 1 Tablet By Mouth Once A Day 2)  Levothyroxine Sodium 50 Mcg Tabs (Levothyroxine Sodium) .Marland Kitchen.. 1 Tab By Mouth Daily 3)  Bayer Low Strength 81 Mg Tbec (Aspirin) .... Take 1 Tablet By Mouth Once A Day 4)  Furosemide 20 Mg Tabs (Furosemide) .... One Tab By Mouth Once Daily As Needed 5)  Cyanocobalamin 1000 Mcg/ml Soln (Cyanocobalamin) .... Take Shot Once Monthly 6)  Lisinopril-Hydrochlorothiazide 20-12.5 Mg Tabs (Lisinopril-Hydrochlorothiazide) .Marland Kitchen.. 1 By Mouth Once Daily 7)  Amlodipine Besylate 5 Mg Tabs (Amlodipine Besylate) .... 1/2 Tab By Mouth Once Daily (Or As Directed For Your Blood Pressure) 8)  Blood Pressure Monitor For Home Use .... As Directed  Allergies (verified): No Known Drug Allergies  Past History:  Past Medical History: Patient is Jehovah Witness - no blood products Hypertension Hypothyroidism Osteoarthritis migraines Allergic rhinitis a-fib - no coumadin murmur last stress test was 9/09 with no blockages reported per patient  MD rooster: cards -brackbill  Review of Systems  The patient denies anorexia, weight loss, chest pain, syncope, and headaches.    Physical Exam  General:  alert, well-developed, well-nourished, and cooperative to examination.    Lungs:  normal respiratory effort, no intercostal retractions or use of accessory muscles; normal breath sounds bilaterally - no crackles and no wheezes.    Heart:  normal rate , irregular rhythm, no murmur, and no rub. BLE without edema.   Impression & Recommendations:  Problem # 1:  HYPERTENSION  (ICD-401.9)  Her updated medication list for this problem includes:    Furosemide 20 Mg Tabs (Furosemide) ..... One tab by mouth once daily as needed    Lisinopril-hydrochlorothiazide 10-12.5 Mg Tabs (Lisinopril-hydrochlorothiazide) .Marland Kitchen... 1 by mouth once daily    Amlodipine Besylate 5 Mg Tabs (Amlodipine besylate) .Marland Kitchen... 1/2 tab by mouth once daily (may hold unless bp over 130/85)  BP today: 118/84 Prior BP: 132/74 (06/22/2009)  Labs Reviewed: K+: 4.0 (07/25/2008) Creat: : 1.1 (07/25/2008)     Orders: TLB-Lipid Panel (80061-LIPID) Prescription Created Electronically [id number])  Problem # 2:  B12 DEFICIENCY (ICD-266.2)  Orders: Vit B12 1000 mcg (J3420) Admin of Therapeutic Inj  intramuscular or subcutaneous (19147)  Problem # 3:  HYPOTHYROIDISM (ICD-244.9)  Her updated medication list for this problem includes:    Levothyroxine Sodium 50 Mcg Tabs (Levothyroxine sodium) .Marland Kitchen... 1 tab by mouth daily  Labs Reviewed: TSH: 1.88 (01/23/2009)     Orders: TLB-TSH (Thyroid Stimulating Hormone) (84443-TSH)  Problem # 4:  ATRIAL FIBRILLATION (ICD-427.31) f/u as planned with crads -brackbill - in 2 weeks - stable Her updated medication list for this problem includes:    Digoxin 0.125 Mg Tabs (Digoxin) .Marland Kitchen... Take 1 tablet by mouth once a day    Bayer Low Strength 81 Mg Tbec (Aspirin) .Marland Kitchen... Take 1 tablet by mouth once a day    Amlodipine Besylate 5 Mg Tabs (Amlodipine besylate) .Marland Kitchen... 1/2 tab by mouth once daily (may hold unless bp over 130/85)  Complete Medication List: 1)  Digoxin 0.125 Mg Tabs (Digoxin) .... Take 1 tablet by mouth once a day 2)  Levothyroxine Sodium 50 Mcg Tabs (Levothyroxine sodium) .Marland Kitchen.. 1 tab by mouth daily 3)  Bayer Low Strength 81 Mg Tbec (Aspirin) .... Take 1 tablet by mouth once a day 4)  Furosemide 20 Mg Tabs (Furosemide) .... One tab by mouth once daily as needed 5)  Cyanocobalamin 1000 Mcg/ml Soln (Cyanocobalamin) .... Take shot once monthly 6)   Lisinopril-hydrochlorothiazide 10-12.5 Mg Tabs (Lisinopril-hydrochlorothiazide) .Marland Kitchen.. 1 by mouth once daily 7)  Amlodipine Besylate 5 Mg Tabs (Amlodipine besylate) .... 1/2 tab by mouth once daily (may hold unless bp over 130/85) 8)  Blood Pressure Monitor For Home Use  .... As directed  Patient Instructions: 1)  it was good to see you today. 2)  reduce amlodipine as discussed if blood pressure well cotnrolled to see if this helps with your fatigue symptoms - 3)  no other medication change s- 4)  test(s) ordered today - your results will be posted on the phone tree for review in 48-72 hours from the time of test completion; call [phone] and enter your 9 digit MRN (listed above on this page, just below your name); if any changes need to be made  or there are abnormal results, you will be contacted directly.  5)  will send copy of labs to dr. Patty Sermons for review 6)  Please schedule a follow-up appointment in 4-72months, sooner if problems.  Prescriptions: LISINOPRIL-HYDROCHLOROTHIAZIDE 10-12.5 MG TABS (LISINOPRIL-HYDROCHLOROTHIAZIDE) 1 by mouth once daily  #30 x 11   Entered and Authorized by:   Newt Lukes MD   Signed by:   Newt Lukes MD on 08/24/2009   Method used:   Electronically to        [facility]  [phone]* (retail)       [address]       [place], [place]  [address]       Ph: [phone] or [phone]       Fax: [phone]   RxID:   [id number]    Medication Administration  Injection # 1:    Medication: Vit B12 1000 mcg    Diagnosis: B12 DEFICIENCY (ICD-266.2)    Route: IM    Site: L deltoid    Exp Date: 03/2011    Lot #: [id number]    Mfr: [employer]    Patient tolerated injection without complications    Given by: Orlan Leavens (August 24, 2009 10:35 AM)  Orders Added: 1)  Vit B12 1000 mcg [J3420] 2)  Admin of Therapeutic Inj  intramuscular or subcutaneous [96372] 3)  TLB-TSH (Thyroid Stimulating Hormone)  [84443-TSH] 4)  TLB-Lipid Panel [80061-LIPID] 5)  Est. Patient Level IV [38756] 6)  Prescription Created Electronically [phone]

## 2010-07-15 NOTE — Assessment & Plan Note (Signed)
Summary: blood presssure/b12 shot/cd   Vital Signs:  Patient profile:   77 year old female Height:      61 inches (154.94 cm) Weight:      124.8 pounds (56.73 kg) O2 Sat:      96 % on Room air Temp:     97.6 degrees F (36.44 degrees C) oral Pulse rate:   84 / minute BP sitting:   132 / 74  (left arm) Cuff size:   regular  Vitals Entered By: Orlan Leavens (June 22, 2009 9:47 AM)  O2 Flow:  Room air CC: F/u on BP. pt states by her BP machine she has been getting 160's/100. I recheck BP in (R) arm 130/82. Pt check in her (L) arm with her machine 163/99 Is Patient Diabetic? No Pain Assessment Patient in pain? no        Primary Care Provider:  Newt Lukes MD  CC:  F/u on BP. pt states by her BP machine she has been getting 160's/100. I recheck BP in (R) arm 130/82. Pt check in her (L) arm with her machine 163/99.  History of Present Illness: HTN - concerned about new home machine readings - "too high" : 150-160s/90-100s reports compliance with ongoing medical treatment and no changes in medication dose or frequency. denies adverse side effects related to current therapy.   Current Medications (verified): 1)  Digoxin 0.125 Mg Tabs (Digoxin) .... Take 1 Tablet By Mouth Once A Day 2)  Levothyroxine Sodium 50 Mcg Tabs (Levothyroxine Sodium) .Marland Kitchen.. 1 Tab By Mouth Daily 3)  Bayer Low Strength 81 Mg Tbec (Aspirin) .... Take 1 Tablet By Mouth Once A Day 4)  Furosemide 20 Mg Tabs (Furosemide) .... One Tab By Mouth Once Daily As Needed 5)  Cyanocobalamin 1000 Mcg/ml Soln (Cyanocobalamin) .... Take Shot Once Monthly 6)  Lisinopril-Hydrochlorothiazide 20-12.5 Mg Tabs (Lisinopril-Hydrochlorothiazide) .Marland Kitchen.. 1 By Mouth Once Daily 7)  Amlodipine Besylate 5 Mg Tabs (Amlodipine Besylate) .... 1/2 Tab By Mouth Once Daily (Or As Directed For Your Blood Pressure) 8)  Blood Pressure Monitor For Home Use .... As Directed  Allergies (verified): No Known Drug Allergies  Review of Systems  The  patient denies chest pain and headaches.    Physical Exam  General:  alert, well-developed, well-nourished, and cooperative to examination.    Lungs:  normal respiratory effort, no intercostal retractions or use of accessory muscles; normal breath sounds bilaterally - no crackles and no wheezes.    Heart:  normal rate , irregular rhythm, no murmur, and no rub. BLE without edema.   Impression & Recommendations:  Problem # 1:  HYPERTENSION (ICD-401.9) improved - cpont same educated re: home BP machine readings to use as guide to BP trends as not always as accurate as we'd like -  Her updated medication list for this problem includes:    Furosemide 20 Mg Tabs (Furosemide) ..... One tab by mouth once daily as needed    Lisinopril-hydrochlorothiazide 20-12.5 Mg Tabs (Lisinopril-hydrochlorothiazide) .Marland Kitchen... 1 by mouth once daily    Amlodipine Besylate 5 Mg Tabs (Amlodipine besylate) .Marland Kitchen... 1/2 tab by mouth once daily (or as directed for your blood pressure)  BP today: 132/74 Prior BP: 162/100 (04/13/2009)  Labs Reviewed: K+: 4.0 (07/25/2008) Creat: : 1.1 (07/25/2008)     Complete Medication List: 1)  Digoxin 0.125 Mg Tabs (Digoxin) .... Take 1 tablet by mouth once a day 2)  Levothyroxine Sodium 50 Mcg Tabs (Levothyroxine sodium) .Marland Kitchen.. 1 tab by mouth daily 3)  Bayer  Low Strength 81 Mg Tbec (Aspirin) .... Take 1 tablet by mouth once a day 4)  Furosemide 20 Mg Tabs (Furosemide) .... One tab by mouth once daily as needed 5)  Cyanocobalamin 1000 Mcg/ml Soln (Cyanocobalamin) .... Take shot once monthly 6)  Lisinopril-hydrochlorothiazide 20-12.5 Mg Tabs (Lisinopril-hydrochlorothiazide) .Marland Kitchen.. 1 by mouth once daily 7)  Amlodipine Besylate 5 Mg Tabs (Amlodipine besylate) .... 1/2 tab by mouth once daily (or as directed for your blood pressure) 8)  Blood Pressure Monitor For Home Use  .... As directed  Other Orders: Vit B12 1000 mcg (J3420) Admin of Therapeutic Inj  intramuscular or subcutaneous  (16109)  Patient Instructions: 1)  ok to use your monitor as a guide for blood pressure trends but do not worry about the "exact readings" as may not be as accurate as an office reading - 2)  if you are having symptoms of chest pain, headache, vision change or other problmes, you should call for a nurse visit to check your actual blood pressure reading -   Medication Administration  Injection # 1:    Medication: Vit B12 1000 mcg    Diagnosis: B12 DEFICIENCY (ICD-266.2)    Route: IM    Site: L deltoid    Exp Date: 02/2011    Lot #: [id number]    Mfr: American Regent  Orders Added: 1)  Vit B12 1000 mcg [J3420] 2)  Admin of Therapeutic Inj  intramuscular or subcutaneous [96372] 3)  Est. Patient Level III [40981]

## 2010-07-15 NOTE — Assessment & Plan Note (Signed)
Summary: PER PT 1 MTH B12 VL--STC  Nurse Visit   Allergies: No Known Drug Allergies  Medication Administration  Injection # 1:    Medication: Vit B12 1000 mcg    Diagnosis: B12 DEFICIENCY (ICD-266.2)    Route: IM    Site: R deltoid    Exp Date: 02/12/2011    Lot #: [id number]    Mfr: American Regent    Patient tolerated injection without complications    Given by: Margaret Pyle, CMA (July 24, 2009 9:54 AM)  Orders Added: 1)  Admin of Therapeutic Inj  intramuscular or subcutaneous [96372] 2)  Vit B12 1000 mcg [J3420] 3)  Est. Patient Level I [96045]

## 2010-07-17 ENCOUNTER — Encounter: Payer: Self-pay | Admitting: Cardiology

## 2010-07-21 NOTE — Progress Notes (Signed)
Summary: lisinopril  Phone Note Refill Request Message from:  Fax from Pharmacy on July 13, 2010 9:50 AM  Refills Requested: Medication #1:  LISINOPRIL 10 MG TABS take 1 by mouth once daily   Last Refilled: 05/29/2010 [email or web address]  [phone]     Prescriptions: LISINOPRIL 10 MG TABS (LISINOPRIL) take 1 by mouth once daily  #30 x 5   Entered by:   Orlan Leavens RMA   Authorized by:   Newt Lukes MD   Signed by:   Orlan Leavens RMA on 07/13/2010   Method used:   Electronically to        [facility]       [address]       [place], [place]  [address]       Ph: [phone]       Fax: [phone]   RxID:   [id number]

## 2010-08-23 ENCOUNTER — Encounter: Payer: Self-pay | Admitting: Internal Medicine

## 2010-08-23 LAB — HM MAMMOGRAPHY

## 2010-09-06 ENCOUNTER — Encounter: Payer: Self-pay | Admitting: Cardiology

## 2010-09-06 ENCOUNTER — Ambulatory Visit (INDEPENDENT_AMBULATORY_CARE_PROVIDER_SITE_OTHER): Payer: Self-pay | Admitting: Cardiology

## 2010-09-06 VITALS — BP 140/90 | HR 69 | Wt 120.0 lb

## 2010-09-06 DIAGNOSIS — E7800 Pure hypercholesterolemia, unspecified: Secondary | ICD-10-CM

## 2010-09-06 DIAGNOSIS — R011 Cardiac murmur, unspecified: Secondary | ICD-10-CM

## 2010-09-06 DIAGNOSIS — R609 Edema, unspecified: Secondary | ICD-10-CM

## 2010-09-06 DIAGNOSIS — J309 Allergic rhinitis, unspecified: Secondary | ICD-10-CM

## 2010-09-06 DIAGNOSIS — I4891 Unspecified atrial fibrillation: Secondary | ICD-10-CM

## 2010-09-06 DIAGNOSIS — R600 Localized edema: Secondary | ICD-10-CM

## 2010-09-06 DIAGNOSIS — I447 Left bundle-branch block, unspecified: Secondary | ICD-10-CM

## 2010-09-06 MED ORDER — FUROSEMIDE 20 MG PO TABS: 20.0000 mg | ORAL_TABLET | Freq: Every day | ORAL | Status: DC

## 2010-09-06 NOTE — Assessment & Plan Note (Signed)
For her allergic rhinitis and sinus congestion with postnasal drip she will take over-the-counter plain Mucinex 600 mg twice a day p.r.n.

## 2010-09-06 NOTE — Progress Notes (Signed)
History of Present Illness: [name] is seen for a scheduled followup office visit.  He has a known history of chronic nature fibrillation.  She is not on warfarin she has had no TIA or stroke symptoms has a history of hypothyroidism and is on Synthroid.  Also gets monthly B12 shots.  She has valvular heart disease and is taking an ACE inhibitor.  She sleeps on one pillow.  She's not having any paroxysmal nocturnal dyspnea.  She does have exertional dyspnea when she climbs steps.  She has had increased problems with peripheral edema.  Current Outpatient Prescriptions  Medication Sig Dispense Refill  . aspirin EC 81 MG EC tablet Take 81 mg by mouth daily.        . digoxin (LANOXIN) 0.125 MG tablet Take 125 mcg by mouth daily.        Marland Kitchen levothyroxine (SYNTHROID, LEVOTHROID) 50 MCG tablet Take 50 mcg by mouth daily.        Marland Kitchen lisinopril (PRINIVIL,ZESTRIL) 10 MG tablet Take 10 mg by mouth daily.        . furosemide (LASIX) 20 MG tablet Take 1 tablet (20 mg total) by mouth daily.  30 tablet  11  . lisinopril-hydrochlorothiazide (PRINZIDE,ZESTORETIC) 10-12.5 MG per tablet Take 1 tablet by mouth daily.          Allergies  Allergen Reactions  . Altace     dyspnea  . Cozaar     insomnia  . Sulfa Antibiotics   . Hctz (Hydrochlorothiazide) Rash    Patient Active Problem List  Diagnoses  . HYPOTHYROIDISM  . B12 DEFICIENCY  . COMMON MIGRAINE  . HYPERTENSION  . Atrial fibrillation  . SINUSITIS, FRONTAL, ACUTE  . ALLERGIC RHINITIS  . HEMATURIA UNSPECIFIED  . OSTEOARTHRITIS  . CERVICALGIA  . FATIGUE  . CARDIAC MURMUR  . GERD  . Edema extremities  . Left bundle branch block    History  Smoking status  . Former Smoker  Smokeless tobacco  . Not on file    History  Alcohol Use     Family History  Problem Relation Age of Onset  . Stroke Mother     Review of Systems: Constitutional: no fever chills diaphoresis or fatigue or change in weight.  Head and  neck: no hearing loss, no epistaxis, no photophobia or visual disturbance. Respiratory: No cough, shortness of breath or wheezing. Cardiovascular: No chest pain peripheral edema, palpitations. Gastrointestinal: No abdominal distention, no abdominal pain, no change in bowel habits hematochezia or melena. Genitourinary: No dysuria, no frequency, no urgency, no nocturia. Musculoskeletal:No arthralgias, no back pain, no gait disturbance or myalgias. Neurological: No dizziness, no headaches, no numbness, no seizures, no syncope, no weakness, no tremors. Hematologic: No lymphadenopathy, no easy bruising. Psychiatric: No confusion, no hallucinations, no sleep disturbance.    Physical Exam: Filed Vitals:   09/06/10 1011  BP: 140/90  Pulse: 69  Weight: 120 lb (54.432 kg)  This is a well-developed well-nourished woman in no distress.Pupils equal and reactive.   Extraocular Movements are full.  There is no scleral icterus.  The mouth and pharynx are normal.  The neck is supple.  The carotids reveal no bruits.  The jugular venous pressure is normal.  The thyroid is not enlarged.  There is no lymphadenopathy.The chest is clear to percussion and auscultation. There are no rales or rhonchi. Expansion of the chest is symmetrical.  The heart reveals an irregular heart rhythm from atrial Fibrillation and there is a soft apical  systolic and diastolic murmur at the apex.The abdomen is soft and nontender. Bowel sounds are normal. The liver and spleen are not enlarged. There Are no abdominal masses. There are no bruits.  Extremities show 2+ bilateral peripheral edema.Strength is normal and symmetrical in all extremities.  There is no lateralizing weakness.  There are no sensory deficits.The skin is warm and dry.  There is no rash.   Assessment / Plan:

## 2010-09-06 NOTE — Assessment & Plan Note (Signed)
Her electrocardiogram today reviewed by me shows atrial fibrillation with left bundle-branch block.  The bundle-branch block was first determined to be present in September 2011 it had not been present on 02/29/08.  She's not having any Stokes-Adams attacks.

## 2010-09-06 NOTE — Assessment & Plan Note (Signed)
The patient is having peripheral edema and is unable to take hydrochlorothiazide because of an allergic reaction to sulfa.  We will prescribe furosemide 20 mg daily.  The patient sleeps on one pillow.

## 2010-09-06 NOTE — Assessment & Plan Note (Addendum)
This patient has a long history of established atrial fibrillation.  She refuses Coumadin because her religion would not allow her to have any blood transfusions.  She is a Scientist, product/process development.  She is on an 81 mg aspirin daily.  She has not had any thromboembolic symptoms.  She does have some exertional dyspnea.Her ventricular response is well controlled on present dose of digoxin 0.125 mg daily

## 2010-09-06 NOTE — Assessment & Plan Note (Signed)
Her last echocardiogram was 03/08/10 and showed moderate biatrial enlargement, normal left ventricular systolic function with mild LVH, mild aortic insufficiency, mild mitral regurgitation, moderate tricuspid regurgitation with mild pulmonary hypertension.  Her right ventricular systolic pressure is 44

## 2010-09-07 LAB — COMPREHENSIVE METABOLIC PANEL
BUN: 24 mg/dL — ABNORMAL HIGH (ref 6–23)
CO2: 25 mEq/L (ref 19–32)
Creat: 1.1 mg/dL (ref 0.40–1.20)
Glucose, Bld: 69 mg/dL — ABNORMAL LOW (ref 70–99)
Total Bilirubin: 0.7 mg/dL (ref 0.3–1.2)

## 2010-09-07 LAB — DIGOXIN LEVEL: Digoxin Level: 1 ng/mL (ref 0.8–2.0)

## 2010-09-10 ENCOUNTER — Telehealth: Payer: Self-pay | Admitting: *Deleted

## 2010-09-10 NOTE — Telephone Encounter (Signed)
Advised of labs 

## 2010-09-10 NOTE — Telephone Encounter (Signed)
Message copied by Regis Bill on Fri Sep 10, 2010 10:40 AM ------      Message from: Cassell Clement      Created: Tue Sep 07, 2010  9:43 AM       Digoxin level normal.Liver function studies are normal.Potassium is normal.  Stay on present medication.

## 2010-09-10 NOTE — Progress Notes (Signed)
Advised patient

## 2010-09-15 ENCOUNTER — Encounter: Payer: Self-pay | Admitting: Internal Medicine

## 2010-09-20 ENCOUNTER — Other Ambulatory Visit (INDEPENDENT_AMBULATORY_CARE_PROVIDER_SITE_OTHER): Payer: Medicare Other | Admitting: *Deleted

## 2010-09-20 DIAGNOSIS — Z79899 Other long term (current) drug therapy: Secondary | ICD-10-CM

## 2010-09-20 LAB — BASIC METABOLIC PANEL
Chloride: 95 mEq/L — ABNORMAL LOW (ref 96–112)
GFR: 43.94 mL/min — ABNORMAL LOW (ref >60.00)
Potassium: 4.2 mEq/L (ref 3.5–5.1)
Sodium: 137 mEq/L (ref 135–145)

## 2010-09-20 NOTE — Progress Notes (Signed)
Quick Note:  Kidneys are getting too dry. Cut back on lasix to QOD ______

## 2010-09-22 ENCOUNTER — Telehealth: Payer: Self-pay | Admitting: *Deleted

## 2010-09-22 NOTE — Telephone Encounter (Signed)
Left mess. For pt to call back regarding lab results 

## 2010-09-23 ENCOUNTER — Telehealth: Payer: Self-pay | Admitting: *Deleted

## 2010-09-23 NOTE — Telephone Encounter (Signed)
Adv patient of lab results

## 2010-11-23 ENCOUNTER — Encounter: Payer: Self-pay | Admitting: Cardiology

## 2010-11-24 ENCOUNTER — Encounter: Payer: Self-pay | Admitting: Cardiology

## 2010-12-31 ENCOUNTER — Ambulatory Visit (INDEPENDENT_AMBULATORY_CARE_PROVIDER_SITE_OTHER): Payer: Medicare Other | Admitting: Internal Medicine

## 2010-12-31 DIAGNOSIS — K319 Disease of stomach and duodenum, unspecified: Secondary | ICD-10-CM

## 2011-01-07 ENCOUNTER — Other Ambulatory Visit (INDEPENDENT_AMBULATORY_CARE_PROVIDER_SITE_OTHER): Payer: Medicare Other

## 2011-01-07 ENCOUNTER — Encounter: Payer: Self-pay | Admitting: Internal Medicine

## 2011-01-07 ENCOUNTER — Ambulatory Visit (INDEPENDENT_AMBULATORY_CARE_PROVIDER_SITE_OTHER): Payer: Medicare Other | Admitting: Internal Medicine

## 2011-01-07 VITALS — BP 150/90 | HR 68 | Temp 98.0°F | Ht 61.0 in | Wt 115.4 lb

## 2011-01-07 DIAGNOSIS — E039 Hypothyroidism, unspecified: Secondary | ICD-10-CM

## 2011-01-07 DIAGNOSIS — Z1211 Encounter for screening for malignant neoplasm of colon: Secondary | ICD-10-CM

## 2011-01-07 DIAGNOSIS — R600 Localized edema: Secondary | ICD-10-CM

## 2011-01-07 DIAGNOSIS — I10 Essential (primary) hypertension: Secondary | ICD-10-CM

## 2011-01-07 DIAGNOSIS — R609 Edema, unspecified: Secondary | ICD-10-CM

## 2011-01-07 DIAGNOSIS — I4891 Unspecified atrial fibrillation: Secondary | ICD-10-CM

## 2011-01-07 LAB — BASIC METABOLIC PANEL
BUN: 19 mg/dL (ref 6–23)
CO2: 31 mEq/L (ref 19–32)
GFR: 58.14 mL/min — ABNORMAL LOW (ref >60.00)
Glucose, Bld: 85 mg/dL (ref 70–99)
Potassium: 4.5 mEq/L (ref 3.5–5.1)

## 2011-01-07 MED ORDER — LISINOPRIL 20 MG PO TABS: 20.0000 mg | ORAL_TABLET | Freq: Every day | ORAL | Status: DC

## 2011-01-07 NOTE — Progress Notes (Signed)
  Subjective:    Patient ID: [name] [name], female    DOB: Oct 17, 1929, 77 y.o.   MRN: [id number]  HPI Here for follow up -reviewed chronic medical  issues:   HTN - feels BP better at home since off atenolol and off lasix - SBP 110-120s at home, never 140 or higher  also stopped amlodipine as felt this caused fatigue symptoms  tol ACEI w/o adv effects -denies CAD or CVA -  would prefer not to have any med tx for this - reviewed ASD risks of uncontrolled BP  hx B12 defic - labs reviewed  stopped montly injections - spring 08/2009 last injection -  no numbness, +fatigue as noted   Afib - no palpitaions, SOB or CP - declines anticoag due to fear of bleeding [place] witness) no adv symptoms off Bbloc  has cards app every 6-12 months   hypothyroid - reports compliance with ongoing medical treatment and no changes in medication dose or frequency.  denies adverse side effects related to current therapy. no weight changes or bowel change   Past Medical History  Diagnosis Date  . Mitral valve disease   . Mitral regurgitation   . Hypothyroidism   . B12 deficiency   . Heart murmur   . Allergy   . OA (osteoarthritis)   . Migraines   . Atrial fibrillation     pt declines anticoag (jehovah's witness)    Review of Systems  Respiratory: Negative for cough.   Cardiovascular: Negative for chest pain.  Neurological: Negative for dizziness and headaches.       Objective:   Physical Exam BP 150/90  Pulse 68  Temp(Src) 98 F (36.7 C) (Oral)  Ht 5\' 1"  (1.549 m)  Wt 115 lb 6.4 oz (52.345 kg)  BMI 21.80 kg/m2  SpO2 96%  BP Readings from Last 3 Encounters:  01/07/11 150/90  09/06/10 140/90  06/28/10 130/82   Constitutional: She is thin, oriented to person, place, and time. She appears well-developed and well-nourished. No distress.  Neck: Normal range of motion. Neck supple. No JVD present. No thyromegaly present.  Cardiovascular: Normal rate, irregular rhythm and normal heart  sounds.  No murmur heard. No BLE edema. Pulmonary/Chest: Effort normal and breath sounds normal. No respiratory distress. She has no wheezes. Neurological: She is alert and oriented to person, place, and time. No cranial nerve deficit. Coordination normal.  Psychiatric: She has a normal mood and affect. Her behavior is normal. Judgment and thought content normal.   Lab Results  Component Value Date   WBC 6.7 06/28/2010   HGB 15.3* 06/28/2010   HCT 45.6 06/28/2010   PLT 207.0 06/28/2010   CHOL 190 06/28/2010   TRIG 71.0 06/28/2010   HDL 84.10 06/28/2010   ALT 15 09/06/2010   AST 31 09/06/2010   NA 137 09/20/2010   K 4.2 09/20/2010   CL 95* 09/20/2010   CREATININE 1.3* 09/20/2010   BUN 31* 09/20/2010   CO2 32 09/20/2010   TSH 1.81 06/28/2010       Assessment & Plan:   See problem list. Medications and labs reviewed today.

## 2011-01-07 NOTE — Progress Notes (Signed)
Left message on machine for pt to return my call  

## 2011-01-07 NOTE — Assessment & Plan Note (Signed)
Uncontrolled - intolerant of many meds including Bbloc and amlodipine Increase ACEI dose - erx done Recheck labs (prior inc Cr on lasix but has stopped all diuretics)

## 2011-01-07 NOTE — Assessment & Plan Note (Signed)
Long hx same - rate controlled with dig, on ASA 81 qd -  declines anticoag due to religious beliefs (no transfusion) and fear of bleeding on coumadin Continue same and follow up cards q 6-12 mo as ongoing

## 2011-01-07 NOTE — Patient Instructions (Signed)
It was good to see you today. Increase lisinopril to 20 mg daily for blood pressure control -Your prescription(s) have been submitted to your pharmacy. Please take as directed and contact our office if you believe you are having problem(s) with the medication(s). Test(s) ordered today. Your results will be called to you after review (48-72hours after test completion), and also mailed to you. If any changes need to be made, you will be notified at that time. we'll make referral to [facility] to discuss options for colon cancer screening . Our office will contact you regarding appointment(s) once made. Please schedule followup in 6 months, call sooner if problems.

## 2011-01-07 NOTE — Assessment & Plan Note (Signed)
Check lab now - adjust if needed The current medical regimen is effective;  continue present plan and medications.  Lab Results  Component Value Date   TSH 1.81 06/28/2010

## 2011-01-13 ENCOUNTER — Encounter: Payer: Self-pay | Admitting: Gastroenterology

## 2011-02-07 ENCOUNTER — Other Ambulatory Visit: Payer: Self-pay | Admitting: Internal Medicine

## 2011-03-07 ENCOUNTER — Encounter: Payer: Self-pay | Admitting: Gastroenterology

## 2011-03-07 ENCOUNTER — Ambulatory Visit (INDEPENDENT_AMBULATORY_CARE_PROVIDER_SITE_OTHER): Payer: Medicare Other | Admitting: Gastroenterology

## 2011-03-07 DIAGNOSIS — R1013 Epigastric pain: Secondary | ICD-10-CM

## 2011-03-07 DIAGNOSIS — R1903 Right lower quadrant abdominal swelling, mass and lump: Secondary | ICD-10-CM

## 2011-03-07 DIAGNOSIS — K319 Disease of stomach and duodenum, unspecified: Secondary | ICD-10-CM

## 2011-03-07 DIAGNOSIS — K3189 Other diseases of stomach and duodenum: Secondary | ICD-10-CM

## 2011-03-07 MED ORDER — PEG-KCL-NACL-NASULF-NA ASC-C 100 G PO SOLR: 1.0000 | Freq: Once | ORAL | Status: DC

## 2011-03-07 NOTE — Patient Instructions (Signed)
Colonoscopy A colonoscopy is an exam to evaluate your entire colon. In this exam, your colon is cleansed. A long fiberoptic tube is inserted through your rectum and into your colon. The fiberoptic scope (endoscope) is a long bundle of enclosed and very flexible fibers. These fibers transmit light to the area examined and send images from that area to your caregiver. Discomfort is usually minimal. You may be given a drug to help you sleep (sedative) during or prior to the procedure. This exam helps to detect lumps (tumors), polyps, inflammation, and areas of bleeding. Your caregiver may also take a small piece of tissue (biopsy) that will be examined under a microscope. BEFORE THE PROCEDURE  A clear liquid diet may be required for 2 days before the exam.   Liquid injections (enemas) or laxatives may be required.   A large amount of electrolyte solution may be given to you to drink over a short period of time. This solution is used to clean out your colon.   You should be present 1 hour prior to your procedure or as directed by your caregiver.   Check in at the admissions desk to fill out necessary forms if not preregistered. There will be consent forms to sign prior to the procedure. If accompanied by friends or family, there is a waiting area for them while you are having your procedure.  LET YOUR CAREGIVER KNOW ABOUT:  Allergies to food or medicine.  Medicines taken, including vitamins, herbs, eyedrops, over-the-counter medicines, and creams.   Use of steroids (by mouth or creams).   Previous problems with anesthetics or numbing medicines.   History of bleeding problems or blood clots.  Previous surgery.   Other health problems, including diabetes and kidney problems.   Possibility of pregnancy, if this applies.   AFTER THE PROCEDURE  If you received a sedative and/or pain medicine, you will need to arrange for someone to drive you home.   Occasionally, there is a little blood  passed with the first bowel movement. DO NOT be concerned.  HOME CARE INSTRUCTIONS  It is not unusual to pass moderate amounts of gas and experience mild abdominal cramping following the procedure. This is due to air being used to inflate your colon during the exam. Walking or a warm pack on your belly (abdomen) may help.   You may resume all normal meals and activities after sedatives and medicines have worn off.   Only take over-the-counter or prescription medicines for pain, discomfort, or fever as directed by your caregiver. DO NOT use aspirin or blood thinners if a biopsy was taken. Consult your caregiver for medicine usage if biopsies were taken.  FINDING OUT THE RESULTS OF YOUR TEST Not all test results are available during your visit. If your test results are not back during the visit, make an appointment with your caregiver to find out the results. Do not assume everything is normal if you have not heard from your caregiver or the medical facility. It is important for you to follow up on all of your test results. SEEK IMMEDIATE MEDICAL CARE IF:  You have an oral temperature above 100, not controlled by medicine.   You pass large blood clots or fill a toilet with blood following the procedure. This may also occur 10 to 14 days following the procedure. This is more likely if a biopsy was taken.   You develop abdominal pain that keeps getting worse and cannot be relieved with medicine.  Document Released: 05/27/2000 Document Re-Released:  08/24/2009 ExitCare Patient Information 2011 [place].

## 2011-03-07 NOTE — Progress Notes (Signed)
History of Present Illness:  [name] [name] is a pleasant 77 year old white female Jehovah's Witness with history of mitral valve disease, atrial fibrillation and hypothyroidism, referred at the request of Dr. Felicity Coyer for complaints of abdominal bloating and discomfort. She has never had a colonoscopy. She denies change in bowel habits, melena or hematochezia. She has vague abdominal discomfort in the periumbilical area, especially at night. She denies pyrosis, nausea or weight loss. Hemoglobin in January, 2012 was 15.    Review of Systems: She complains of just not feeling right. Her most specific complaint is fatigue. Pertinent positive and negative review of systems were noted in the above HPI section. All other review of systems were otherwise negative.    Current Medications, Allergies, Past Medical History, Past Surgical History, Family History and Social History were reviewed in [employer] [facility] electronic medical record  Vital signs were reviewed in today's medical record. Physical Exam: General: Well developed , well nourished, no acute distress Head: Normocephalic and atraumatic Eyes:  sclerae anicteric, EOMI Ears: Normal auditory acuity Mouth: No deformity or lesions Lungs: Clear throughout to auscultation Heart: Regular rate and rhythm; no murmurs, rubs or bruits Abdomen: Soft, non tender and non distended. No hepatosplenomegaly or hernias noted. Normal Bowel sounds; there is a mass or fullness in the right para umbilical area that is nontender. Rectal:deferred Neurological: Alert oriented x 4, grossly nonfocal Psychological:  Alert and cooperative. Normal mood and affect Ext: there is 2+ pedal edema and chronic venous stasis changes

## 2011-03-07 NOTE — Assessment & Plan Note (Addendum)
Symptoms are very nonspecific. There is a question of a lower abdominal mass which may account for her complaints. Ulcer and nonulcer dyspepsia are  considerations.  Recommendations #1 upper endoscopy if colonoscopy is not diagnostic

## 2011-03-07 NOTE — Assessment & Plan Note (Addendum)
There is a possible mass by physical exam.  Conditions #1 colonoscopy  Risks, alternatives, and complications of the procedure, including bleeding, perforation, and possible need for surgery, were explained to the patient.  Patient's questions were answered.

## 2011-03-21 ENCOUNTER — Other Ambulatory Visit: Payer: Self-pay | Admitting: Gastroenterology

## 2011-03-21 ENCOUNTER — Telehealth: Payer: Self-pay | Admitting: Gastroenterology

## 2011-03-21 NOTE — Telephone Encounter (Signed)
Pt wondering if she could eat green beans and wondered if she could use Miralax for her prep. Explained to pt that we have given her instructions for Moviprep and it also was sent to her pharmacy

## 2011-03-24 ENCOUNTER — Encounter: Payer: Self-pay | Admitting: Gastroenterology

## 2011-03-24 ENCOUNTER — Ambulatory Visit (AMBULATORY_SURGERY_CENTER): Payer: Medicare Other | Admitting: Gastroenterology

## 2011-03-24 VITALS — BP 189/115 | HR 84 | Temp 97.3°F | Resp 20 | Ht 60.0 in | Wt 114.0 lb

## 2011-03-24 DIAGNOSIS — K5730 Diverticulosis of large intestine without perforation or abscess without bleeding: Secondary | ICD-10-CM

## 2011-03-24 DIAGNOSIS — R1033 Periumbilical pain: Secondary | ICD-10-CM

## 2011-03-24 DIAGNOSIS — R109 Unspecified abdominal pain: Secondary | ICD-10-CM

## 2011-03-24 DIAGNOSIS — K3189 Other diseases of stomach and duodenum: Secondary | ICD-10-CM

## 2011-03-24 MED ORDER — SODIUM CHLORIDE 0.9 % IV SOLN: 500.0000 mL | INTRAVENOUS | Status: DC

## 2011-03-24 NOTE — Progress Notes (Signed)
Propofol per m [employer]. See scanned intra procedure report. EWM Pt noted to be in and out of atrial fibrillation. Strip shown to md and no further orders given. ewm

## 2011-03-24 NOTE — Patient Instructions (Signed)
Please refer to blue and green discharge instruction sheets and hand-outs. 

## 2011-03-25 ENCOUNTER — Telehealth: Payer: Self-pay | Admitting: *Deleted

## 2011-03-25 NOTE — Telephone Encounter (Signed)

## 2011-03-28 ENCOUNTER — Telehealth: Payer: Self-pay

## 2011-03-28 ENCOUNTER — Other Ambulatory Visit: Payer: Self-pay | Admitting: Gastroenterology

## 2011-03-28 ENCOUNTER — Ambulatory Visit (INDEPENDENT_AMBULATORY_CARE_PROVIDER_SITE_OTHER): Payer: Medicare Other | Admitting: Cardiology

## 2011-03-28 ENCOUNTER — Encounter: Payer: Self-pay | Admitting: Cardiology

## 2011-03-28 ENCOUNTER — Ambulatory Visit (INDEPENDENT_AMBULATORY_CARE_PROVIDER_SITE_OTHER)
Admission: RE | Admit: 2011-03-28 | Discharge: 2011-03-28 | Disposition: A | Discharge status: Home / Self Care | Payer: Medicare Other | Source: Ambulatory Visit | Attending: Cardiology | Admitting: Cardiology

## 2011-03-28 VITALS — BP 128/88 | HR 78 | Ht 60.0 in | Wt 112.0 lb

## 2011-03-28 DIAGNOSIS — R1900 Intra-abdominal and pelvic swelling, mass and lump, unspecified site: Secondary | ICD-10-CM

## 2011-03-28 DIAGNOSIS — R059 Cough, unspecified: Secondary | ICD-10-CM

## 2011-03-28 DIAGNOSIS — I447 Left bundle-branch block, unspecified: Secondary | ICD-10-CM

## 2011-03-28 DIAGNOSIS — R05 Cough: Secondary | ICD-10-CM

## 2011-03-28 DIAGNOSIS — E039 Hypothyroidism, unspecified: Secondary | ICD-10-CM

## 2011-03-28 DIAGNOSIS — R609 Edema, unspecified: Secondary | ICD-10-CM

## 2011-03-28 DIAGNOSIS — R600 Localized edema: Secondary | ICD-10-CM

## 2011-03-28 DIAGNOSIS — I4891 Unspecified atrial fibrillation: Secondary | ICD-10-CM

## 2011-03-28 DIAGNOSIS — E7800 Pure hypercholesterolemia, unspecified: Secondary | ICD-10-CM

## 2011-03-28 NOTE — Progress Notes (Signed)
[name] [name] Date of Birth:  Apr 15, 1930 [name] [address]   [address] [place], [place]  [address] [phone]           Fax   [phone]  History of Present Illness: This pleasant 77 year old woman is seen for a six-month followup office visit.  She has a history of known chronic established atrial fibrillation.  She has refused to be on warfarin for religious reasons.  She is on an aspirin.  Fortunately, she has not had any TIA or stroke symptoms.  The patient also has hypothyroidism and is on Synthroid.  She has a history of mitral regurgitation and has a history of cardiomegaly.  She sleeps on one pillow.  She has not been experiencing any paroxysmal nocturnal dyspnea.  Her last visit.  She was experiencing some peripheral edema, and we gave her Lasix, but she stopped it because it made her feel worse.  Her appetite is poor and she has lost 8 pounds since last visit.  Current Outpatient Prescriptions  Medication Sig Dispense Refill  . aspirin EC 81 MG EC tablet Take 81 mg by mouth daily.        . brimonidine-timolol (COMBIGAN) 0.2-0.5 % ophthalmic solution Place 1 drop into both eyes every 12 (twelve) hours.        . digoxin (LANOXIN) 0.125 MG tablet Take 125 mcg by mouth daily.        Marland Kitchen latanoprost (XALATAN) 0.005 % ophthalmic solution 1 drop in both eyes everyday      . levothyroxine (SYNTHROID, LEVOTHROID) 50 MCG tablet TAKE ONE TABLET BY MOUTH ONE TIME DAILY  30 tablet  4  . lisinopril (PRINIVIL,ZESTRIL) 20 MG tablet Take 1 tablet (20 mg total) by mouth daily.  30 tablet  6    Allergies  Allergen Reactions  . Altace     dyspnea  . Cozaar     insomnia  . Sulfa Antibiotics   . Hctz (Hydrochlorothiazide) Rash    Patient Active Problem List  Diagnoses  . HYPOTHYROIDISM  . B12 DEFICIENCY  . COMMON MIGRAINE  . HYPERTENSION  . Atrial fibrillation  . SINUSITIS, FRONTAL, ACUTE  . ALLERGIC RHINITIS  . HEMATURIA UNSPECIFIED  .  OSTEOARTHRITIS  . CERVICALGIA  . FATIGUE  . CARDIAC MURMUR  . GERD  . Edema extremities  . Left bundle branch block  . 536.8  . Dyspepsia  . Right lower quadrant abdominal mass    History  Smoking status  . Former Smoker  Smokeless tobacco  . Never Used    History  Alcohol Use  . Yes    Social    Family History  Problem Relation Age of Onset  . Stroke Mother   . Arthritis Father   . Hypertension Mother   . Stomach cancer Father     Review of Systems: Constitutional: no fever chills diaphoresis or fatigue or change in weight.  Head and neck: no hearing loss, no epistaxis, no photophobia or visual disturbance. Respiratory: No cough, shortness of breath or wheezing. Cardiovascular: No chest pain peripheral edema, palpitations. Gastrointestinal: No abdominal distention, no abdominal pain, no change in bowel habits hematochezia or melena. Genitourinary: No dysuria, no frequency, no urgency, no nocturia. Musculoskeletal:No arthralgias, no back pain, no gait disturbance or myalgias. Neurological: No dizziness, no headaches, no numbness, no seizures, no syncope, no weakness, no tremors. Hematologic: No lymphadenopathy, no easy bruising. Psychiatric: No confusion, no hallucinations, no sleep disturbance.    Physical Exam: Filed Vitals:  03/28/11 1037  BP: 128/88  Pulse: 78   Gen. appearance reveals a thin, elderly woman in no acute distress.Pupils equal and reactive.   Extraocular Movements are full.  There is no scleral icterus.  The mouth and pharynx are normal.  The neck is supple.  The carotids reveal no bruits.  The jugular venous pressure is normal.  The thyroid is not enlarged.  There is no lymphadenopathy.  The chest is clear to percussion and auscultation. There are no rales or rhonchi. Expansion of the chest is symmetrical.  Heart reveals a soft apical systolic murmur.  The rhythm is irregular.The abdomen is soft and nontender. Bowel sounds are normal. The  liver and spleen are not enlarged. There Are no abdominal masses. There are no bruits.  The pedal pulses are good.  There is no phlebitis or edema.  There is no cyanosis or clubbing. Strength is normal and symmetrical in all extremities.  There is no lateralizing weakness.  There are no sensory deficit.The skin is warm and dry.  There is no rash.   Assessment / Plan:  Patient is to continue same medication.  She will get a chest x-ray to followup on her recent cough, and on her previous history of mild cardiomegaly.  Her last chest x-ray through our office was in 2008.  At that time.  She had moderate cardiomegaly prolongs were clear.

## 2011-03-28 NOTE — Assessment & Plan Note (Signed)
The patient's edema has improved since last visit.  She is no longer having to take any diuretic.

## 2011-03-28 NOTE — Assessment & Plan Note (Signed)
The patient is clinically euthyroid on her present dose of Synthroid

## 2011-03-28 NOTE — Patient Instructions (Signed)
continue same dose of medications  Your physician wants you to follow-up in: 6 months You will receive a reminder letter in the mail two months in advance. If you don't receive a letter, please call our office to schedule the follow-up appointment.  

## 2011-03-28 NOTE — Telephone Encounter (Signed)
Pt scheduled for CT scan of abdomen and pelvis at [facility] for 03/31/11 pt to arrive at 2:15pm, scan at 2:30pm. Pt to have labs drawn prior to CT and to go by radiology to pick up contrast. Pt aware. Pt given the phone number for radiology scheduling because pt wants to reschedule her appt to a Friday. Pt to call and move her appt.

## 2011-03-28 NOTE — Assessment & Plan Note (Signed)
Patient has a history of known left bundle branch block.  She has not had any Stokes-Adams attacks.  She has not had any extreme bradycardia.  Her rhythm remains atrial fibrillation, controlled ventricular response.

## 2011-03-30 ENCOUNTER — Other Ambulatory Visit: Payer: Self-pay | Admitting: Cardiology

## 2011-03-31 ENCOUNTER — Telehealth: Payer: Self-pay | Admitting: *Deleted

## 2011-03-31 ENCOUNTER — Other Ambulatory Visit (INDEPENDENT_AMBULATORY_CARE_PROVIDER_SITE_OTHER): Payer: Medicare Other

## 2011-03-31 ENCOUNTER — Inpatient Hospital Stay (HOSPITAL_COMMUNITY)
Admission: RE | Admit: 2011-03-31 | Discharge: 2011-03-31 | Payer: Medicare Other | Source: Ambulatory Visit | Attending: Gastroenterology | Admitting: Gastroenterology

## 2011-03-31 DIAGNOSIS — R1900 Intra-abdominal and pelvic swelling, mass and lump, unspecified site: Secondary | ICD-10-CM

## 2011-03-31 LAB — BASIC METABOLIC PANEL
BUN: 26 mg/dL — ABNORMAL HIGH (ref 6–23)
Chloride: 98 mEq/L (ref 96–112)
Creatinine, Ser: 1.2 mg/dL (ref 0.4–1.2)

## 2011-03-31 NOTE — Telephone Encounter (Signed)
Message copied by Burnell Blanks on Thu Mar 31, 2011  8:42 AM ------      Message from: Cassell Clement      Created: Mon Mar 28, 2011  5:47 PM       Please report.  The chest x-ray is satisfactory.  The heart size is again enlarged, but significant change since prior x-rays.  There is no evidence of pneumonia or heart failure.  Continue present medication.

## 2011-03-31 NOTE — Telephone Encounter (Signed)
Refilled lanoxin 

## 2011-03-31 NOTE — Telephone Encounter (Signed)
Advised of chest xray results 

## 2011-04-01 ENCOUNTER — Ambulatory Visit (HOSPITAL_COMMUNITY)
Admission: RE | Admit: 2011-04-01 | Discharge: 2011-04-01 | Disposition: A | Discharge status: Home / Self Care | Payer: Medicare Other | Source: Ambulatory Visit | Attending: Gastroenterology | Admitting: Gastroenterology

## 2011-04-01 DIAGNOSIS — I517 Cardiomegaly: Secondary | ICD-10-CM | POA: Insufficient documentation

## 2011-04-01 DIAGNOSIS — R1900 Intra-abdominal and pelvic swelling, mass and lump, unspecified site: Secondary | ICD-10-CM

## 2011-04-01 DIAGNOSIS — D259 Leiomyoma of uterus, unspecified: Secondary | ICD-10-CM | POA: Insufficient documentation

## 2011-04-01 DIAGNOSIS — K7689 Other specified diseases of liver: Secondary | ICD-10-CM | POA: Insufficient documentation

## 2011-04-01 MED ORDER — IOHEXOL 300 MG/ML  SOLN
100.0000 mL | Freq: Once | INTRAMUSCULAR | Status: AC | PRN
  Administered 2011-04-01: 100 mL via INTRAVENOUS

## 2011-04-04 ENCOUNTER — Telehealth: Payer: Self-pay

## 2011-04-04 NOTE — Telephone Encounter (Signed)
Please inform pt that CT ddid not show any signicant changes in the abdomen         Attached Reports     The sender attached the following reports to this message:   Order report 1         Pt aware.

## 2011-04-10 NOTE — Telephone Encounter (Signed)
Yes, no significant change in the chest x-ray since the previous one.

## 2011-04-29 NOTE — Progress Notes (Signed)
A user error has taken place: encounter opened in error, closed for administrative reasons.

## 2011-06-03 ENCOUNTER — Ambulatory Visit (INDEPENDENT_AMBULATORY_CARE_PROVIDER_SITE_OTHER): Payer: Medicare Other | Admitting: Internal Medicine

## 2011-06-03 ENCOUNTER — Encounter: Payer: Self-pay | Admitting: Internal Medicine

## 2011-06-03 VITALS — BP 140/82 | HR 77 | Temp 97.0°F

## 2011-06-03 DIAGNOSIS — K047 Periapical abscess without sinus: Secondary | ICD-10-CM

## 2011-06-03 DIAGNOSIS — K044 Acute apical periodontitis of pulpal origin: Secondary | ICD-10-CM

## 2011-06-03 DIAGNOSIS — H9209 Otalgia, unspecified ear: Secondary | ICD-10-CM

## 2011-06-03 DIAGNOSIS — S025XXA Fracture of tooth (traumatic), initial encounter for closed fracture: Secondary | ICD-10-CM

## 2011-06-03 MED ORDER — ANTIPYRINE-BENZOCAINE 5.4-1.4 % OT SOLN: 3.0000 [drp] | OTIC | Status: AC | PRN

## 2011-06-03 MED ORDER — AMOXICILLIN 500 MG PO CAPS: 500.0000 mg | ORAL_CAPSULE | Freq: Three times a day (TID) | ORAL | Status: AC

## 2011-06-03 MED ORDER — HYDROCODONE-ACETAMINOPHEN 5-500 MG PO TABS: 1.0000 | ORAL_TABLET | Freq: Three times a day (TID) | ORAL | Status: AC | PRN

## 2011-06-03 NOTE — Progress Notes (Signed)
  Subjective:    Patient ID: [name] [name], female    DOB: 1929/11/05, 77 y.o.   MRN: [id number]  Otalgia  There is pain in the right ear. This is a recurrent problem. The current episode started 1 to 4 weeks ago. The problem occurs constantly. The problem has been waxing and waning. There has been no fever. The pain is at a severity of 4/10. Pertinent negatives include no ear discharge, hearing loss, neck pain, rash or sore throat. Associated symptoms comments: Jaw discomfort when opening mouth. She has tried acetaminophen for the symptoms. The treatment provided mild relief.    Past Medical History  Diagnosis Date  . Mitral valve disease   . Mitral regurgitation   . Hypothyroidism   . B12 deficiency   . Heart murmur   . Allergy   . OA (osteoarthritis)   . Migraines   . Atrial fibrillation     pt declines anticoag (jehovah's witness)     Review of Systems  HENT: Positive for ear pain. Negative for hearing loss, sore throat, neck pain and ear discharge.   Skin: Negative for rash.       Objective:   Physical Exam Gen: nontoxic, NAD HENT: R ear without rash or swelling - R TM partially obscured with cerumen but what is visible appears clear without erythema/effusion - no TMJ discomfort, jaw swelling or deformity - broken molar upper jaw on R side with erythema - OP clear without exudate - sinus region NT Neck - no mass, LAD - supple with FROM Lung: CTA B CV: RRR      Assessment & Plan:  R ear pain - suspect referred from R broken molar with tooth infection -  Amoxicillin x 10d, hydrocodone prn and ear drops (for wax and discomfort) - erx done [facility] primary problem in 06/2011

## 2011-06-03 NOTE — Patient Instructions (Signed)
It was good to see you today. Amoxicillin antibiotics 3x/day x 10days, vicodin as needed for severe pain and use ear drops for pain and wax - Your prescription(s) have been submitted to your pharmacy. Please take as directed and contact our office if you believe you are having problem(s) with the medication(s). Keep working on dental appointment in Jan 2013 as we discussed

## 2011-07-08 ENCOUNTER — Encounter: Payer: Self-pay | Admitting: Internal Medicine

## 2011-07-08 ENCOUNTER — Other Ambulatory Visit (INDEPENDENT_AMBULATORY_CARE_PROVIDER_SITE_OTHER): Payer: Medicare Other

## 2011-07-08 ENCOUNTER — Ambulatory Visit (INDEPENDENT_AMBULATORY_CARE_PROVIDER_SITE_OTHER): Payer: Medicare Other | Admitting: Internal Medicine

## 2011-07-08 DIAGNOSIS — I10 Essential (primary) hypertension: Secondary | ICD-10-CM

## 2011-07-08 DIAGNOSIS — E039 Hypothyroidism, unspecified: Secondary | ICD-10-CM

## 2011-07-08 DIAGNOSIS — E538 Deficiency of other specified B group vitamins: Secondary | ICD-10-CM

## 2011-07-08 DIAGNOSIS — H6120 Impacted cerumen, unspecified ear: Secondary | ICD-10-CM

## 2011-07-08 LAB — BASIC METABOLIC PANEL
BUN: 30 mg/dL — ABNORMAL HIGH (ref 6–23)
CO2: 30 mEq/L (ref 19–32)
Chloride: 98 mEq/L (ref 96–112)
Creatinine, Ser: 1.1 mg/dL (ref 0.4–1.2)
Glucose, Bld: 91 mg/dL (ref 70–99)
Potassium: 5 mEq/L (ref 3.5–5.1)

## 2011-07-08 LAB — VITAMIN B12: Vitamin B-12: 269 pg/mL (ref 211–911)

## 2011-07-08 MED ORDER — ATENOLOL 25 MG PO TABS: 25.0000 mg | ORAL_TABLET | Freq: Every day | ORAL | Status: DC

## 2011-07-08 NOTE — Assessment & Plan Note (Signed)
Prior monthly IM replacement until 08/2009 Recheck now - no replacement ongoing at this time

## 2011-07-08 NOTE — Patient Instructions (Signed)
It was good to see you today. Test(s) ordered today. Your results will be called to you after review (48-72hours after test completion). If any changes need to be made, you will be notified at that time. Your ears have been irrigated of wax today -let us know if continued hearing problems persist for referral to audiologist and hearing testing Stop lisinopril and resume atenolol for blood pressure - Your prescription(s) have been submitted to your pharmacy. Please take as directed and contact our office if you believe you are having problem(s) with the medication(s). Please schedule followup in 6 months, call sooner if problems.

## 2011-07-08 NOTE — Progress Notes (Signed)
Subjective:    Patient ID: [name] [name], female    DOB: Aug 24, 1929, 78 y.o.   MRN: [id number]  HPI  Here for follow up -reviewed chronic medical  issues:   HTN - feels BP better at home since off atenolol and off lasix - SBP 110-120s at home, never 140 or higher -also stopped amlodipine as felt this caused fatigue symptoms  tol ACEI w/o adv effects -denies CAD or CVA but feels atenolol worked better  hx B12 defic - labs reviewed  stopped montly injections - spring 08/2009 last injection -  no numbness, +fatigue as noted   Afib - no palpitations, shortness of breath or chest pain - declines anticoag due to fear of bleeding Rica Koyanagi witness); no adv symptoms off Bbloc, in dig - follows with cards app every 6-12 months   hypothyroid - reports compliance with ongoing medical treatment and no changes in medication dose or frequency. denies adverse side effects related to current therapy. no weight changes or bowel change   Past Medical History  Diagnosis Date  . Mitral valve disease   . Mitral regurgitation   . Hypothyroidism   . B12 deficiency   . Heart murmur   . Allergy   . OA (osteoarthritis)   . Migraines   . Atrial fibrillation     pt declines anticoag (jehovah's witness)  . Cardiomegaly     on CT scan 03/2011    Review of Systems  Constitutional: Positive for fatigue.  HENT: Positive for hearing loss (L>R). Negative for ear pain.   Respiratory: Negative for cough and shortness of breath.   Cardiovascular: Negative for chest pain.  Neurological: Negative for dizziness and headaches.       Objective:   Physical Exam  BP 152/80  Pulse 75  Temp(Src) 96.9 F (36.1 C) (Oral)  Wt 111 lb (50.349 kg)  SpO2 97%  BP Readings from Last 3 Encounters:  07/08/11 152/80  06/03/11 140/82  03/28/11 128/88   Constitutional: She is thin, appears well-developed and well-nourished. No distress.  HENT: B cerumen impaction - after irrigation, B TMs clear without effusion or  erythema Neck: Normal range of motion. Neck supple. No JVD present. No thyromegaly present.  Cardiovascular: Normal rate, irregular rhythm and normal heart sounds.  No murmur heard. No BLE edema. Pulmonary/Chest: Effort normal and breath sounds normal. No respiratory distress. She has no wheezes. Psychiatric: She has a normal mood and affect. Her behavior is normal. Judgment and thought content normal.   Lab Results  Component Value Date   WBC 6.7 06/28/2010   HGB 15.3* 06/28/2010   HCT 45.6 06/28/2010   PLT 207.0 06/28/2010   CHOL 190 06/28/2010   TRIG 71.0 06/28/2010   HDL 84.10 06/28/2010   ALT 15 09/06/2010   AST 31 09/06/2010   NA 136 03/31/2011   K 4.1 03/31/2011   CL 98 03/31/2011   CREATININE 1.2 03/31/2011   BUN 26* 03/31/2011   CO2 30 03/31/2011   TSH 1.30 01/07/2011   Lab Results  Component Value Date   VITAMINB12 473 12/25/2009   Procedure: wax removal Reason: wax impaction Risks and benefits of procedure discussed with the patient who agrees to proceed. Ear(s) irrigated with warm water. Large amount of wax removed. Instrumentation with metal ear loop was performed to accomplish wax removal. the patient tolerated procedure well.     Assessment & Plan:  B cerumen impaction - irrigation today as above -hearing appears improved - recommend over-the-counter drops to prevent accumulation  as discussed  Also see problem list. Medications and labs reviewed today.

## 2011-07-08 NOTE — Assessment & Plan Note (Signed)
Check lab now - adjust if needed The current medical regimen is effective;  continue present plan and medications.  Lab Results  Component Value Date   TSH 1.30 01/07/2011

## 2011-07-08 NOTE — Assessment & Plan Note (Signed)
Uncontrolled - intolerant of many meds including Bbloc and amlodipine due to fatigue Increase ACEI dose ineffective - ?rash/cough As fatigue unimproved off Bbloc, pt will ing to resume atenolol Also recheck labs (prior inc Cr on lasix but has stopped all diuretics) BP Readings from Last 3 Encounters:  07/08/11 152/80  06/03/11 140/82  03/28/11 128/88

## 2011-07-09 ENCOUNTER — Other Ambulatory Visit: Payer: Self-pay | Admitting: Internal Medicine

## 2011-07-28 ENCOUNTER — Telehealth: Payer: Self-pay | Admitting: Cardiology

## 2011-07-28 NOTE — Telephone Encounter (Signed)
New msg Pt said she has been having swelling in ankles and some sob for about couple of weeks. She wanted to talk to you

## 2011-07-28 NOTE — Telephone Encounter (Signed)
Okay to work in patient next week

## 2011-07-28 NOTE — Telephone Encounter (Signed)
Swelling does go down at night sometimes. Denies any chest pains.  Dr Felicity Coyer changed her Lisinopril to Atenolol in January at [facility].  Was not having these problems prior to that.  Would like to see  Dr. Patty Sermons on Monday (at hospital tomorrow).  Will forward to  Dr. Patty Sermons for review to see if any changes and ok to work in

## 2011-07-28 NOTE — Telephone Encounter (Signed)
Scheduled appointment for Monday and advised if worse go to ED or urgent care

## 2011-08-01 ENCOUNTER — Encounter: Payer: Self-pay | Admitting: Cardiology

## 2011-08-01 ENCOUNTER — Ambulatory Visit (INDEPENDENT_AMBULATORY_CARE_PROVIDER_SITE_OTHER): Payer: Medicare Other | Admitting: Cardiology

## 2011-08-01 ENCOUNTER — Telehealth: Payer: Self-pay | Admitting: Cardiology

## 2011-08-01 VITALS — BP 140/98 | HR 78 | Ht 60.0 in | Wt 114.0 lb

## 2011-08-01 DIAGNOSIS — R0600 Dyspnea, unspecified: Secondary | ICD-10-CM

## 2011-08-01 DIAGNOSIS — I071 Rheumatic tricuspid insufficiency: Secondary | ICD-10-CM

## 2011-08-01 DIAGNOSIS — K219 Gastro-esophageal reflux disease without esophagitis: Secondary | ICD-10-CM

## 2011-08-01 DIAGNOSIS — I079 Rheumatic tricuspid valve disease, unspecified: Secondary | ICD-10-CM

## 2011-08-01 DIAGNOSIS — R609 Edema, unspecified: Secondary | ICD-10-CM

## 2011-08-01 DIAGNOSIS — I4891 Unspecified atrial fibrillation: Secondary | ICD-10-CM

## 2011-08-01 NOTE — Assessment & Plan Note (Signed)
Since stopping her lisinopril she has noted increasing peripheral edema and has been more short of breath.  Her weight is up 3 pounds since last visit.  She had a chest x-ray several months ago which showed stable cardiomegaly and no CHF.

## 2011-08-01 NOTE — Progress Notes (Signed)
[name] [name] Date of Birth:  1929/12/03 [facility] [address] [place], [place]  [address] [phone]         Fax   [phone]  History of Present Illness: This pleasant 78 year old woman is seen as a work in the office visit.  She comes in ahead of schedule because of worsening problems with weight gain and shortness of breath and peripheral edema.  She has a history of known valvular heart disease.  Her last echocardiogram on 03/08/10 showed an ejection fraction of 55-60% with mild mitral regurgitation, mild aortic insufficiency, and moderate tricuspid regurgitation with an elevated right ventricular systolic pressure of 44.  She is in chronic atrial fibrillation.  She is not on Coumadin because she refuses Coumadin on religious grounds.  Since we last saw her her ACE inhibitor was stopped by her primary care provider and she was placed back on atenolol which she has been on in the past.  She has not been expressing any chest pain and she does not have any history of ischemic heart disease and she had a normal nuclear stress test in 2009  Current Outpatient Prescriptions  Medication Sig Dispense Refill  . aspirin EC 81 MG EC tablet Take 81 mg by mouth daily.        Marland Kitchen atenolol (TENORMIN) 25 MG tablet Take 25 mg by mouth daily. Taking 1/2 daily      . cholecalciferol (VITAMIN D) 1000 UNITS tablet Take 1,000 Units by mouth daily.      . Cyanocobalamin (VITAMIN B 12 PO) Take by mouth daily.      . digoxin (LANOXIN) 0.125 MG tablet TAKE ONE TABLET BY MOUTH AS DIRECTED  30 tablet  3  . furosemide (LASIX) 20 MG tablet Take 20 mg by mouth as needed.      . latanoprost (XALATAN) 0.005 % ophthalmic solution 1 drop in both eyes everyday      . levothyroxine (SYNTHROID, LEVOTHROID) 50 MCG tablet TAKE ONE TABLET BY MOUTH ONE TIME DAILY  30 tablet  5  . lisinopril (PRINIVIL,ZESTRIL) 10 MG tablet Take 10 mg by mouth daily.      Marland Kitchen DISCONTD: levothyroxine (SYNTHROID,  LEVOTHROID) 50 MCG tablet TAKE ONE TABLET BY MOUTH ONE TIME DAILY  30 tablet  5    Allergies  Allergen Reactions  . Altace     dyspnea  . Cozaar     insomnia  . Sulfa Antibiotics   . Hctz (Hydrochlorothiazide) Rash    Patient Active Problem List  Diagnoses  . HYPOTHYROIDISM  . B12 DEFICIENCY  . COMMON MIGRAINE  . HYPERTENSION  . Atrial fibrillation  . ALLERGIC RHINITIS  . HEMATURIA UNSPECIFIED  . OSTEOARTHRITIS  . GERD  . Left bundle branch block    History  Smoking status  . Former Smoker  Smokeless tobacco  . Never Used    History  Alcohol Use  . Yes    Social    Family History  Problem Relation Age of Onset  . Stroke Mother   . Arthritis Father   . Hypertension Mother   . Stomach cancer Father     Review of Systems: Constitutional: no fever chills diaphoresis or fatigue or change in weight.  Head and neck: no hearing loss, no epistaxis, no photophobia or visual disturbance. Respiratory: No cough, shortness of breath or wheezing. Cardiovascular: No chest pain peripheral edema, palpitations. Gastrointestinal: No abdominal distention, no abdominal pain, no change in bowel habits hematochezia or melena. Genitourinary: No dysuria,  no frequency, no urgency, no nocturia. Musculoskeletal:No arthralgias, no back pain, no gait disturbance or myalgias. Neurological: No dizziness, no headaches, no numbness, no seizures, no syncope, no weakness, no tremors. Hematologic: No lymphadenopathy, no easy bruising. Psychiatric: No confusion, no hallucinations, no sleep disturbance.    Physical Exam: Filed Vitals:   08/01/11 1151  BP: 140/98  Pulse: 78   the general appearance reveals a well-developed elderly woman in no acute distress.  Head and neck exam reveals that the jugular venous pressure is elevated.  There is no thyroid megaly and the pupils are equal and reactive to light and accommodation. The chest is clear to percussion and auscultation.  She does not  have any rales or rhonchi or wheezing.  Heart reveals an irregular pulse and a grade 2/6 systolic murmur at the left sternal edge.  No diastolic murmur.  No gallop or rub.The abdomen is soft and nontender. Bowel sounds are normal. The liver and spleen are not enlarged. There Are no abdominal masses. There are no bruits.  Extremities show thick ankles with 1+ pitting edema.Strength is normal and symmetrical in all extremities.  There is no lateralizing weakness.  There are no sensory deficits.  The skin is warm and dry.  There is no rash.    Assessment / Plan: [name] [name] going to send her for a two-dimensional echocardiogram to update her valve function and her pulmonary artery pressure.  I want her to restart her lisinopril 10 mg daily.  She may also take her 20 mg furosemide on a when necessary basis for excessive weight gain.  Keep regular appointment in April.

## 2011-08-01 NOTE — Assessment & Plan Note (Signed)
The patient has not been having any TIA or stroke symptoms.  She is on a daily aspirin.

## 2011-08-01 NOTE — Telephone Encounter (Signed)
Lisinopril 10mg  was prescribed for pt today and she needs an rx called into [facility]

## 2011-08-01 NOTE — Assessment & Plan Note (Signed)
The patient had been having some recent problems with digestion and swallowing.  However these symptoms seem to have improved spontaneously over the past several days.  She had also been having a lot of nausea and stomach upset.

## 2011-08-01 NOTE — Patient Instructions (Addendum)
Restart Lisinopril 10 mg daily Use your Lasix (furosemide) 20 mg as needed  Your physician recommends that you schedule a follow-up appointment in: in April for office visit and EKG  Your physician has requested that you have an echocardiogram. Echocardiography is a painless test that uses sound waves to create images of your heart. It provides your doctor with information about the size and shape of your heart and how well your heart's chambers and valves are working. This procedure takes approximately one hour. There are no restrictions for this procedure.

## 2011-08-03 MED ORDER — LISINOPRIL 10 MG PO TABS: 10.0000 mg | ORAL_TABLET | Freq: Every day | ORAL | Status: DC

## 2011-08-12 ENCOUNTER — Ambulatory Visit (HOSPITAL_COMMUNITY): Payer: Medicare Other | Attending: Cardiology

## 2011-08-12 ENCOUNTER — Other Ambulatory Visit: Payer: Self-pay

## 2011-08-12 ENCOUNTER — Other Ambulatory Visit: Payer: Self-pay | Admitting: *Deleted

## 2011-08-12 DIAGNOSIS — I071 Rheumatic tricuspid insufficiency: Secondary | ICD-10-CM

## 2011-08-12 DIAGNOSIS — I4891 Unspecified atrial fibrillation: Secondary | ICD-10-CM | POA: Insufficient documentation

## 2011-08-12 DIAGNOSIS — R0989 Other specified symptoms and signs involving the circulatory and respiratory systems: Secondary | ICD-10-CM | POA: Insufficient documentation

## 2011-08-12 DIAGNOSIS — R609 Edema, unspecified: Secondary | ICD-10-CM

## 2011-08-12 DIAGNOSIS — I10 Essential (primary) hypertension: Secondary | ICD-10-CM | POA: Insufficient documentation

## 2011-08-12 DIAGNOSIS — R0602 Shortness of breath: Secondary | ICD-10-CM

## 2011-08-12 DIAGNOSIS — R0600 Dyspnea, unspecified: Secondary | ICD-10-CM

## 2011-08-12 DIAGNOSIS — R0609 Other forms of dyspnea: Secondary | ICD-10-CM | POA: Insufficient documentation

## 2011-08-12 MED ORDER — DIGOXIN 125 MCG PO TABS: ORAL_TABLET | ORAL | Status: DC

## 2011-08-12 NOTE — Telephone Encounter (Signed)
Refilled digoxin 

## 2011-08-16 ENCOUNTER — Telehealth: Payer: Self-pay | Admitting: *Deleted

## 2011-08-16 NOTE — Telephone Encounter (Signed)
Advised of echo  

## 2011-08-16 NOTE — Telephone Encounter (Signed)
Message copied by Burnell Blanks on Tue Aug 16, 2011  5:31 PM ------      Message from: Cassell Clement      Created: Sun Aug 14, 2011  8:49 PM       Please report.  The LV systolic function is good.  The pulmonary artery pressure is higher but should improve as she takes the lasix more often. The lisinopril will also help.

## 2011-08-26 ENCOUNTER — Encounter: Payer: Self-pay | Admitting: Internal Medicine

## 2011-10-07 ENCOUNTER — Encounter: Payer: Self-pay | Admitting: Cardiology

## 2011-10-07 ENCOUNTER — Ambulatory Visit (INDEPENDENT_AMBULATORY_CARE_PROVIDER_SITE_OTHER): Payer: Medicare Other | Admitting: Cardiology

## 2011-10-07 VITALS — BP 140/88 | HR 60 | Ht 60.0 in | Wt 111.0 lb

## 2011-10-07 DIAGNOSIS — I4891 Unspecified atrial fibrillation: Secondary | ICD-10-CM

## 2011-10-07 DIAGNOSIS — R609 Edema, unspecified: Secondary | ICD-10-CM

## 2011-10-07 DIAGNOSIS — I10 Essential (primary) hypertension: Secondary | ICD-10-CM

## 2011-10-07 DIAGNOSIS — I272 Pulmonary hypertension, unspecified: Secondary | ICD-10-CM

## 2011-10-07 DIAGNOSIS — I38 Endocarditis, valve unspecified: Secondary | ICD-10-CM

## 2011-10-07 DIAGNOSIS — I2789 Other specified pulmonary heart diseases: Secondary | ICD-10-CM

## 2011-10-07 MED ORDER — LISINOPRIL 10 MG PO TABS: 10.0000 mg | ORAL_TABLET | Freq: Two times a day (BID) | ORAL | Status: DC

## 2011-10-07 NOTE — Patient Instructions (Addendum)
Your physician recommends that you schedule a follow-up appointment in: 3 months with fasting labs (BMET)   Increase your Lisinopril to 10 mg twice a day, new Rx sent to [facility]

## 2011-10-08 DIAGNOSIS — I38 Endocarditis, valve unspecified: Secondary | ICD-10-CM | POA: Insufficient documentation

## 2011-10-08 NOTE — Progress Notes (Signed)
[name] [name] Date of Birth:  12-08-1929 [facility] [address] [place], [place]  [address] [phone]         Fax   [phone]  History of Present Illness: This pleasant 78 year old woman is seen for a scheduled 3 month followup office visit.  She has a history of known valvular heart disease.  Her most recent echocardiogram on 08/12/11 showed normal left ventricular systolic function with elevated filling pressures and an ejection fraction of 60-65%.  She has a multinodular heart disease with mild aortic insufficiency, mild mitral regurgitation, and mild to moderate tricuspid regurgitation with elevated pulmonary artery pressure of 55.  Current Outpatient Prescriptions  Medication Sig Dispense Refill  . aspirin EC 81 MG EC tablet Take 81 mg by mouth daily.        Marland Kitchen atenolol (TENORMIN) 25 MG tablet Take 25 mg by mouth daily. Taking 1/2 daily      . cholecalciferol (VITAMIN D) 1000 UNITS tablet Take 1,000 Units by mouth daily.      . Cyanocobalamin (VITAMIN B 12 PO) Take by mouth daily.      . digoxin (LANOXIN) 0.125 MG tablet One tablet as directed  30 tablet  5  . furosemide (LASIX) 20 MG tablet Take 20 mg by mouth as needed.      . latanoprost (XALATAN) 0.005 % ophthalmic solution 1 drop in both eyes everyday      . levothyroxine (SYNTHROID, LEVOTHROID) 50 MCG tablet TAKE ONE TABLET BY MOUTH ONE TIME DAILY  30 tablet  5  . lisinopril (PRINIVIL,ZESTRIL) 10 MG tablet Take 1 tablet (10 mg total) by mouth 2 (two) times daily.  60 tablet  6    Allergies  Allergen Reactions  . Altace     dyspnea  . Cozaar     insomnia  . Sulfa Antibiotics   . Hctz (Hydrochlorothiazide) Rash    Patient Active Problem List  Diagnoses  . HYPOTHYROIDISM  . B12 DEFICIENCY  . COMMON MIGRAINE  . HYPERTENSION  . Atrial fibrillation  . ALLERGIC RHINITIS  . HEMATURIA UNSPECIFIED  . OSTEOARTHRITIS  . GERD  . Left bundle branch block  . Edema    History  Smoking status   . Former Smoker  Smokeless tobacco  . Never Used    History  Alcohol Use  . Yes    Social    Family History  Problem Relation Age of Onset  . Stroke Mother   . Arthritis Father   . Hypertension Mother   . Stomach cancer Father     Review of Systems: Constitutional: no fever chills diaphoresis or fatigue or change in weight.  Head and neck: no hearing loss, no epistaxis, no photophobia or visual disturbance. Respiratory: No cough, shortness of breath or wheezing. Cardiovascular: No chest pain peripheral edema, palpitations. Gastrointestinal: No abdominal distention, no abdominal pain, no change in bowel habits hematochezia or melena. Genitourinary: No dysuria, no frequency, no urgency, no nocturia. Musculoskeletal:No arthralgias, no back pain, no gait disturbance or myalgias. Neurological: No dizziness, no headaches, no numbness, no seizures, no syncope, no weakness, no tremors. Hematologic: No lymphadenopathy, no easy bruising. Psychiatric: No confusion, no hallucinations, no sleep disturbance.    Physical Exam: Filed Vitals:   10/07/11 1025  BP: 140/88  Pulse: 60   the general appearance reveals an elderly woman in no acute distress.Pupils equal and reactive.   Extraocular Movements are full.  There is no scleral icterus.  The mouth and pharynx are normal.  The neck is supple.  The carotids reveal no bruits.  The jugular venous pressure is normal.  The thyroid is not enlarged.  There is no lymphadenopathy.  The chest is clear to percussion and auscultation. There are no rales or rhonchi. Expansion of the chest is symmetrical.  Heart reveals a grade 2/6 systolic murmur at the lower left sternal edge.  Rhythm is irregular.  There is no S3 gallop. The abdomen is soft and nontender. Bowel sounds are normal. The liver and spleen are not enlarged. There Are no abdominal masses. There are no bruits.  Extremities reveal 2+ pretibial and ankle edema.Strength is normal and  symmetrical in all extremities.  There is no lateralizing weakness.  There are no sensory deficits.  The skin is warm and dry.  There is no rash.  EKG today shows atrial fibrillation with a controlled ventricular response and old left bundle-branch block.   Assessment / Plan:  Continue same medication and continue to use Lasix several times a week as necessary for edema.  Increase lisinopril to twice a day.  Recheck in 3 months for followup office visit and basal metabolic panel.

## 2011-10-08 NOTE — Assessment & Plan Note (Signed)
The patient continues to have moderate peripheral edema.  Her weight however has gone down 3 pounds since last visit.  She is concerned about taking the Lasix to frequently because she is allergic to sulfa.  She has not been experiencing any allergic reaction to Lasix however

## 2011-10-08 NOTE — Assessment & Plan Note (Signed)
The patient is running slightly elevated blood pressures.  We are going to increase her lisinopril to 10 mg twice a day and this should also help her pulmonary hypertension.

## 2011-10-08 NOTE — Assessment & Plan Note (Signed)
The patient is in chronic atrial fibrillation.  She refuses Coumadin because of being a Jehovah's Witness and she is concerned about the possible need for blood transfusion.  Fortunately she has not had any TIA or stroke symptoms.  She is on baby aspirin 81 mg daily.

## 2012-01-06 ENCOUNTER — Ambulatory Visit: Payer: Medicare Other | Admitting: Internal Medicine

## 2012-01-06 ENCOUNTER — Ambulatory Visit: Payer: Medicare Other | Admitting: Cardiology

## 2012-01-09 ENCOUNTER — Other Ambulatory Visit: Payer: Self-pay | Admitting: Internal Medicine

## 2012-01-13 ENCOUNTER — Ambulatory Visit (INDEPENDENT_AMBULATORY_CARE_PROVIDER_SITE_OTHER): Payer: Medicare Other | Admitting: Cardiology

## 2012-01-13 ENCOUNTER — Encounter: Payer: Self-pay | Admitting: Cardiology

## 2012-01-13 VITALS — BP 178/97 | HR 54 | Ht 60.0 in | Wt 107.4 lb

## 2012-01-13 DIAGNOSIS — I4891 Unspecified atrial fibrillation: Secondary | ICD-10-CM

## 2012-01-13 DIAGNOSIS — I10 Essential (primary) hypertension: Secondary | ICD-10-CM

## 2012-01-13 DIAGNOSIS — I272 Pulmonary hypertension, unspecified: Secondary | ICD-10-CM

## 2012-01-13 DIAGNOSIS — I2789 Other specified pulmonary heart diseases: Secondary | ICD-10-CM

## 2012-01-13 DIAGNOSIS — I119 Hypertensive heart disease without heart failure: Secondary | ICD-10-CM

## 2012-01-13 LAB — BASIC METABOLIC PANEL
BUN: 30 mg/dL — ABNORMAL HIGH (ref 6–23)
GFR: 50.23 mL/min — ABNORMAL LOW (ref >60.00)
Potassium: 4.2 mEq/L (ref 3.5–5.1)
Sodium: 136 mEq/L (ref 135–145)

## 2012-01-13 MED ORDER — LISINOPRIL 20 MG PO TABS: 20.0000 mg | ORAL_TABLET | Freq: Two times a day (BID) | ORAL | Status: DC

## 2012-01-13 NOTE — Progress Notes (Signed)
[name] [name] Date of Birth:  09/27/1929 [facility] [address] [place], [place]  [address] [phone]  Fax   [phone]  HPI: This pleasant 78 year old woman is seen for followup of her chronic atrial fibrillation.  She has a history of chronic atrial fibrillation and central hypertension.  She also has pulmonary hypertension.  She has known mild aortic insufficiency and mild mitral regurgitation as well as mild to moderate tricuspid regurgitation with elevated pulmonary artery pressure of 55 noted on echocardiogram 08/12/11  Current Outpatient Prescriptions  Medication Sig Dispense Refill  . ALPRAZolam (XANAX) 0.25 MG tablet Take 0.25 mg by mouth at bedtime as needed.       Marland Kitchen aspirin EC 81 MG EC tablet Take 81 mg by mouth daily.        Marland Kitchen atenolol (TENORMIN) 25 MG tablet Take 25 mg by mouth daily. Taking 1/2 daily      . cholecalciferol (VITAMIN D) 1000 UNITS tablet Take 1,000 Units by mouth daily.      . digoxin (LANOXIN) 0.125 MG tablet One tablet as directed  30 tablet  5  . furosemide (LASIX) 20 MG tablet Take 20 mg by mouth as needed.      Marland Kitchen levothyroxine (SYNTHROID, LEVOTHROID) 50 MCG tablet TAKE ONE TABLET BY MOUTH ONE TIME DAILY  30 tablet  5  . lisinopril (PRINIVIL,ZESTRIL) 20 MG tablet Take 1 tablet (20 mg total) by mouth 2 (two) times daily.  60 tablet  6  . DISCONTD: lisinopril (PRINIVIL,ZESTRIL) 10 MG tablet Take 1 tablet (10 mg total) by mouth 2 (two) times daily.  60 tablet  6  . latanoprost (XALATAN) 0.005 % ophthalmic solution 1 drop in both eyes everyday      . DISCONTD: levothyroxine (SYNTHROID, LEVOTHROID) 50 MCG tablet TAKE ONE TABLET BY MOUTH ONE TIME DAILY  30 tablet  5    Allergies  Allergen Reactions  . Cozaar     insomnia  . Ramipril     dyspnea  . Sulfa Antibiotics   . Hctz (Hydrochlorothiazide) Rash    Patient Active Problem List  Diagnosis  . HYPOTHYROIDISM  . B12 DEFICIENCY  . COMMON MIGRAINE  . HYPERTENSION  .  Atrial fibrillation  . ALLERGIC RHINITIS  . HEMATURIA UNSPECIFIED  . OSTEOARTHRITIS  . GERD  . Left bundle branch block  . Edema  . Valvular heart disease    History  Smoking status  . Former Smoker  Smokeless tobacco  . Never Used    History  Alcohol Use  . Yes    Social    Family History  Problem Relation Age of Onset  . Stroke Mother   . Arthritis Father   . Hypertension Mother   . Stomach cancer Father     Review of Systems: The patient denies any heat or cold intolerance.  No weight gain or weight loss.  The patient denies headaches or blurry vision.  There is no cough or sputum production.  The patient denies dizziness.  There is no hematuria or hematochezia.  The patient denies any muscle aches or arthritis.  The patient denies any rash.  The patient denies frequent falling or instability.  There is no history of depression or anxiety.  All other systems were reviewed and are negative.   Physical Exam: Filed Vitals:   01/13/12 1031  BP: 178/97  Pulse: 54   the general appearance reveals an elderly woman in no acute distress.  Jugular venous pressure is elevated.  The  lungs are clear to percussion and auscultation.  The heart reveals a grade 2/6 decrescendo murmur of aortic insufficiency best heard at the apex.  The abdomen is soft and nontender.  Extremities show trace ankle edema   Assessment / Plan: We filled out a handicap sticker for her to use for her automobile.   we are increasing lisinopril to 20 mg twice a day for better blood pressure control.  The patient will be rechecked in 4 months for followup office visit EKG and basal metabolic panel

## 2012-01-13 NOTE — Progress Notes (Signed)
Quick Note:  Please report to patient. The recent labs are stable. Continue same medication and careful diet. Increase lisinopril as discussed. ______

## 2012-01-13 NOTE — Patient Instructions (Signed)
INCREASE YOUR LISINOPRIL TO 20 MG TWICE A D AY, WILL SEND TO [facility]  Will obtain labs today and call you with the results   Your physician recommends that you schedule a follow-up appointment in: 4 month ov/bmet/ekg

## 2012-01-13 NOTE — Assessment & Plan Note (Signed)
The patient's blood pressure is high today.  I rechecked it and it was still elevated at 150/90.  She has been on lisinopril 10 mg twice a day we will increase that to 20 mg twice a day.  Her checking of the mat today

## 2012-01-13 NOTE — Assessment & Plan Note (Signed)
The patient has not had any worsening of her mild chronic exertional dyspnea.  She's had no TIA symptoms.

## 2012-01-19 ENCOUNTER — Telehealth: Payer: Self-pay | Admitting: *Deleted

## 2012-01-19 NOTE — Telephone Encounter (Signed)
Message copied by Burnell Blanks on Thu Jan 19, 2012  2:24 PM ------      Message from: Cassell Clement      Created: Fri Jan 13, 2012  5:27 PM       Please report to patient.  The recent labs are stable. Continue same medication and careful diet. Increase lisinopril as discussed.

## 2012-01-19 NOTE — Telephone Encounter (Signed)
Mailed copy of labs and left message to call if any questions  

## 2012-02-14 ENCOUNTER — Other Ambulatory Visit: Payer: Self-pay | Admitting: Cardiology

## 2012-02-14 MED ORDER — DIGOXIN 125 MCG PO TABS: ORAL_TABLET | ORAL | Status: DC

## 2012-05-11 ENCOUNTER — Telehealth: Payer: Self-pay | Admitting: Cardiology

## 2012-05-11 ENCOUNTER — Other Ambulatory Visit: Payer: Self-pay | Admitting: Gastroenterology

## 2012-05-11 DIAGNOSIS — R1310 Dysphagia, unspecified: Secondary | ICD-10-CM

## 2012-05-11 NOTE — Telephone Encounter (Signed)
Pt having trouble breathing and wants to know what she needs to do

## 2012-05-11 NOTE — Telephone Encounter (Signed)
Spoke with pt, she was sleeping hard this am and the phone woke her up. She noticed her heart beating fast and she was SOB.  She checked her pulse while on the phone with me and the rate is 72. She is not SOB now and feels okay. Reassurance given to pt, she asked if her appt with dr Patty Sermons could be moved up. appt moved to Monday. She has a little edema in her feet. She will check her blood pressure, if elevated she will call us back.

## 2012-05-14 ENCOUNTER — Ambulatory Visit (INDEPENDENT_AMBULATORY_CARE_PROVIDER_SITE_OTHER)
Admission: RE | Admit: 2012-05-14 | Discharge: 2012-05-14 | Disposition: A | Discharge status: Home / Self Care | Payer: Medicare Other | Source: Ambulatory Visit | Attending: Cardiology | Admitting: Cardiology

## 2012-05-14 ENCOUNTER — Encounter: Payer: Self-pay | Admitting: Cardiology

## 2012-05-14 ENCOUNTER — Ambulatory Visit (INDEPENDENT_AMBULATORY_CARE_PROVIDER_SITE_OTHER): Payer: Medicare Other | Admitting: Cardiology

## 2012-05-14 VITALS — BP 168/67 | HR 111 | Resp 18 | Ht 60.0 in | Wt 108.0 lb

## 2012-05-14 DIAGNOSIS — I4891 Unspecified atrial fibrillation: Secondary | ICD-10-CM

## 2012-05-14 DIAGNOSIS — I10 Essential (primary) hypertension: Secondary | ICD-10-CM

## 2012-05-14 DIAGNOSIS — I38 Endocarditis, valve unspecified: Secondary | ICD-10-CM

## 2012-05-14 DIAGNOSIS — R0602 Shortness of breath: Secondary | ICD-10-CM

## 2012-05-14 DIAGNOSIS — I119 Hypertensive heart disease without heart failure: Secondary | ICD-10-CM

## 2012-05-14 LAB — BASIC METABOLIC PANEL
CO2: 26 mEq/L (ref 19–32)
Calcium: 9.2 mg/dL (ref 8.4–10.5)
Potassium: 3.7 mEq/L (ref 3.5–5.1)
Sodium: 137 mEq/L (ref 135–145)

## 2012-05-14 NOTE — Progress Notes (Signed)
Quick Note:  Please report to patient. The recent labs are stable. Continue same medication and careful diet. ______ 

## 2012-05-14 NOTE — Assessment & Plan Note (Signed)
Blood pressure is running higher today I rechecked her blood pressure myself and it was 150/80 in the right arm sitting.

## 2012-05-14 NOTE — Patient Instructions (Addendum)
Will have you go for a chest xray [facility] [employer] [facility] across from [place]) and call with the results  Increase Lasix (Furosemide) to 20 mg daily  Drink plenty of water  Your physician wants you to follow-up in: 4 months You will receive a reminder letter in the mail two months in advance. If you don't receive a letter, please call our office to schedule the follow-up appointment.

## 2012-05-14 NOTE — Progress Notes (Signed)
[name] [name] Date of Birth:  05-Feb-1930 [facility] [address] [place], [place]  [address] [phone]         Fax   [phone]  History of Present Illness:  This pleasant 78 year old woman is seen for followup of her chronic atrial fibrillation. She has a history of chronic atrial fibrillation and central hypertension. She also has pulmonary hypertension. She has known mild aortic insufficiency and mild mitral regurgitation as well as mild to moderate tricuspid regurgitation with elevated pulmonary artery pressure of 55 noted on echocardiogram 08/12/11   Current Outpatient Prescriptions  Medication Sig Dispense Refill  . ALPRAZolam (XANAX) 0.25 MG tablet Take 0.25 mg by mouth at bedtime as needed.       Marland Kitchen aspirin EC 81 MG EC tablet Take 81 mg by mouth daily.        Marland Kitchen atenolol (TENORMIN) 25 MG tablet Take 25 mg by mouth daily. Taking 1/2 daily      . cholecalciferol (VITAMIN D) 1000 UNITS tablet Take 1,000 Units by mouth daily.      . digoxin (LANOXIN) 0.125 MG tablet One tablet as directed  30 tablet  5  . latanoprost (XALATAN) 0.005 % ophthalmic solution 1 drop in both eyes everyday      . levothyroxine (SYNTHROID, LEVOTHROID) 50 MCG tablet TAKE ONE TABLET BY MOUTH ONE TIME DAILY  30 tablet  5  . lisinopril (PRINIVIL,ZESTRIL) 20 MG tablet Take 1 tablet (20 mg total) by mouth 2 (two) times daily.  60 tablet  6  . furosemide (LASIX) 20 MG tablet Take 20 mg by mouth daily.         Allergies  Allergen Reactions  . Cozaar     insomnia  . Ramipril     dyspnea  . Sulfa Antibiotics   . Hctz (Hydrochlorothiazide) Rash    Patient Active Problem List  Diagnosis  . HYPOTHYROIDISM  . B12 DEFICIENCY  . COMMON MIGRAINE  . HYPERTENSION  . Atrial fibrillation  . ALLERGIC RHINITIS  . HEMATURIA UNSPECIFIED  . OSTEOARTHRITIS  . GERD  . Left bundle branch block  . Edema  . Valvular heart disease    History  Smoking status  . Former Smoker  Smokeless  tobacco  . Never Used    History  Alcohol Use  . Yes    Comment: Social    Family History  Problem Relation Age of Onset  . Stroke Mother   . Arthritis Father   . Hypertension Mother   . Stomach cancer Father     Review of Systems: Constitutional: no fever chills diaphoresis or fatigue or change in weight.  Head and neck: no hearing loss, no epistaxis, no photophobia or visual disturbance. Respiratory: No cough, shortness of breath or wheezing. Cardiovascular: No chest pain peripheral edema, palpitations. Gastrointestinal: No abdominal distention, no abdominal pain, no change in bowel habits hematochezia or melena. Genitourinary: No dysuria, no frequency, no urgency, no nocturia. Musculoskeletal:No arthralgias, no back pain, no gait disturbance or myalgias. Neurological: No dizziness, no headaches, no numbness, no seizures, no syncope, no weakness, no tremors. Hematologic: No lymphadenopathy, no easy bruising. Psychiatric: No confusion, no hallucinations, no sleep disturbance.    Physical Exam: Filed Vitals:   05/14/12 1112  BP: 168/67  Pulse: 111  Resp: 18   the general appearance reveals a well-developed well-nourished elderly woman in no acute distress.The head and neck exam reveals pupils equal and reactive.  Extraocular movements are full.  There is no scleral icterus.  The mouth and pharynx are normal.  The neck is supple.  The carotids reveal no bruits.  The jugular venous pressure is normal.  The  thyroid is not enlarged.  There is no lymphadenopathy.  The chest is clear to percussion and auscultation.  There are no rales or rhonchi.  Expansion of the chest is symmetrical.  The precordium is quiet.  The first heart sound is normal.  The second heart sound is physiologically split.  There is a grade 2/6 apical systolic murmur of mitral regurgitation and a grade 1/6 diastolic murmur along the left sternal edge consistent with aortic insufficiency.  There is no abnormal  lift or heave.  The abdomen is soft and nontender.  The bowel sounds are normal.  The liver and spleen are not enlarged.  There are no abdominal masses.  There are no abdominal bruits.  Extremities reveal good pedal pulses.  There is 2+ edema bilaterally pretibial and pedal .There is no cyanosis or clubbing.  Strength is normal and symmetrical in all extremities.  There is no lateralizing weakness.  There are no sensory deficits.  The skin is warm and dry.  There is no rash.  EKG shows atrial fibrillation with a controlled ventricular response.  Left bundle branch block is seen.  No significant change since last tracing 10/07/11   Assessment / Plan: The patient is having no dyspnea and has evidence of increased fluid retention.  Rather than taking her Lasix just when necessary I want her to take 20 mg Lasix every day.  She will also drink plenty of water to avoid prerenal azotemia.  We will get a chest x-ray on him today.  We will recheck her in 4 months for followup office visit and basal metabolic panel.

## 2012-05-14 NOTE — Assessment & Plan Note (Signed)
Patient has been more short of breath.  Her blood pressure has also been running higher.  She now sleeps on 2 pillows.  She is not having any paroxysmal nocturnal dyspnea.  She does not sleep well at night and wakes up frequently.  She often sleeps part of the night in a recliner.  She does have moderate bilateral peripheral edema and she has not been taking Lasix on a regular basis.

## 2012-05-14 NOTE — Assessment & Plan Note (Signed)
The patient has not had any TIA symptoms.  She is not on warfarin because of her religious beliefs.  She will not accept blood transfusions.

## 2012-05-16 ENCOUNTER — Telehealth: Payer: Self-pay | Admitting: Cardiology

## 2012-05-16 NOTE — Telephone Encounter (Signed)
Left message to call back  

## 2012-05-16 NOTE — Telephone Encounter (Signed)
Message copied by Burnell Blanks on Wed May 16, 2012  3:08 PM ------      Message from: Cassell Clement      Created: Mon May 14, 2012  9:43 PM       Please report to patient.  The recent labs are stable. Continue same medication and careful diet.

## 2012-05-16 NOTE — Telephone Encounter (Signed)
Pt rtn your call

## 2012-05-16 NOTE — Telephone Encounter (Signed)
Message copied by Burnell Blanks on Wed May 16, 2012  3:08 PM ------      Message from: Cassell Clement      Created: Mon May 14, 2012  9:43 PM       Please report. The chest xray is unchanged.

## 2012-05-16 NOTE — Telephone Encounter (Signed)
Patient called back and I returned call twice and had to leave message, no change

## 2012-05-16 NOTE — Telephone Encounter (Signed)
Pt rtn call [phone]

## 2012-05-25 ENCOUNTER — Ambulatory Visit: Payer: Medicare Other | Admitting: Cardiology

## 2012-05-25 ENCOUNTER — Other Ambulatory Visit: Payer: Medicare Other

## 2012-05-28 ENCOUNTER — Ambulatory Visit
Admission: RE | Admit: 2012-05-28 | Discharge: 2012-05-28 | Disposition: A | Discharge status: Home / Self Care | Payer: Medicare Other | Source: Ambulatory Visit | Attending: Gastroenterology | Admitting: Gastroenterology

## 2012-05-28 DIAGNOSIS — R1310 Dysphagia, unspecified: Secondary | ICD-10-CM

## 2012-06-08 ENCOUNTER — Encounter: Payer: Self-pay | Admitting: Cardiology

## 2012-07-14 ENCOUNTER — Emergency Department (HOSPITAL_COMMUNITY)
Admission: EM | Admit: 2012-07-14 | Discharge: 2012-07-14 | Disposition: A | Discharge status: Home / Self Care | Payer: Medicare Other | Source: Home / Self Care | Attending: Emergency Medicine | Admitting: Emergency Medicine

## 2012-07-14 ENCOUNTER — Observation Stay (HOSPITAL_COMMUNITY)
Admission: EM | Admit: 2012-07-14 | Discharge: 2012-07-15 | Disposition: A | Discharge status: Home / Self Care | Payer: Medicare Other | Attending: Otolaryngology | Admitting: Otolaryngology

## 2012-07-14 ENCOUNTER — Encounter (HOSPITAL_COMMUNITY): Payer: Self-pay | Admitting: Emergency Medicine

## 2012-07-14 DIAGNOSIS — E039 Hypothyroidism, unspecified: Secondary | ICD-10-CM | POA: Insufficient documentation

## 2012-07-14 DIAGNOSIS — Z862 Personal history of diseases of the blood and blood-forming organs and certain disorders involving the immune mechanism: Secondary | ICD-10-CM | POA: Insufficient documentation

## 2012-07-14 DIAGNOSIS — R011 Cardiac murmur, unspecified: Secondary | ICD-10-CM | POA: Insufficient documentation

## 2012-07-14 DIAGNOSIS — R6889 Other general symptoms and signs: Secondary | ICD-10-CM | POA: Insufficient documentation

## 2012-07-14 DIAGNOSIS — R040 Epistaxis: Secondary | ICD-10-CM

## 2012-07-14 DIAGNOSIS — Z8679 Personal history of other diseases of the circulatory system: Secondary | ICD-10-CM | POA: Insufficient documentation

## 2012-07-14 DIAGNOSIS — Z8739 Personal history of other diseases of the musculoskeletal system and connective tissue: Secondary | ICD-10-CM | POA: Insufficient documentation

## 2012-07-14 DIAGNOSIS — Z79899 Other long term (current) drug therapy: Secondary | ICD-10-CM | POA: Insufficient documentation

## 2012-07-14 DIAGNOSIS — I10 Essential (primary) hypertension: Secondary | ICD-10-CM | POA: Insufficient documentation

## 2012-07-14 DIAGNOSIS — Z8639 Personal history of other endocrine, nutritional and metabolic disease: Secondary | ICD-10-CM | POA: Insufficient documentation

## 2012-07-14 DIAGNOSIS — Z87891 Personal history of nicotine dependence: Secondary | ICD-10-CM | POA: Insufficient documentation

## 2012-07-14 DIAGNOSIS — J329 Chronic sinusitis, unspecified: Secondary | ICD-10-CM | POA: Insufficient documentation

## 2012-07-14 DIAGNOSIS — I4891 Unspecified atrial fibrillation: Secondary | ICD-10-CM | POA: Insufficient documentation

## 2012-07-14 DIAGNOSIS — Z7982 Long term (current) use of aspirin: Secondary | ICD-10-CM | POA: Insufficient documentation

## 2012-07-14 DIAGNOSIS — I119 Hypertensive heart disease without heart failure: Secondary | ICD-10-CM

## 2012-07-14 MED ORDER — LIDOCAINE VISCOUS 2 % MT SOLN
20.0000 mL | Freq: Once | OROMUCOSAL | Status: AC
  Administered 2012-07-14: 20 mL via OROMUCOSAL
  Filled 2012-07-14: qty 20

## 2012-07-14 NOTE — ED Notes (Signed)
Patient is alert and oriented x3.  She is complaining of bleeding from her right nare. She was see in [facility] earlier today for same complaint.  She stated the bleeding  Had stopped after she was seen earlier but the bleeding returned at 17:30 when she  Returned home

## 2012-07-14 NOTE — ED Notes (Signed)
Pt alert, arrives from home, seen in ED this afternoon, sent home with packing to right nares, + bleeding, started "as soon as i got home", resp even unlabored, skin pwd, tolerating secretions well

## 2012-07-14 NOTE — ED Notes (Signed)
Dr. Oletta Lamas Replaced 4.5 rapid rhino with 7.5 rapid rhino in left nare

## 2012-07-14 NOTE — ED Notes (Signed)
EAV:[id number]<[id number]> Expected date:<BR> Expected time:<BR> Means of arrival:<BR> Comments:<BR> Pt in room

## 2012-07-14 NOTE — ED Notes (Signed)
Pt states that her nose began bleeding about 730 this morning, stopped, and then started back again and she has not been able to stop it.  States that she has had one nose bleed before this about 5 years ago.  Pt states hx of afib.  Only thinner is aspirin.

## 2012-07-14 NOTE — ED Provider Notes (Addendum)
History     CSN: [id number]  Arrival date & time 07/14/12  1218   First MD Initiated Contact with Patient 07/14/12 1246      Chief Complaint  Patient presents with  . Epistaxis    (Consider location/radiation/quality/duration/timing/severity/associated sxs/prior treatment) HPI Comments: Patient reports that she began having a right-sided nosebleed approximately 7:30 this morning without any known trauma. She takes baby aspirin daily. She denies Plavix or Coumadin use. She does have a chronic history of atrial fibrillation. She denies any chest pain, shortness of breath. she has had a recent sinus infection for which he is currently taking antibiotics and she has a few days left. She reports that she has been having to sneeze and blow her nose frequently. She reports she did have a bad nosebleed probably 5 years ago which did end on its own. She tried to hold pressure and planted with Kleenex which did not improve her symptoms. She is now leaking some blood from her left nostril and has been bleeding down the back of her throat. She denies any abdominal pain, shortness of breath or nausea at this time. Denies any lightheadedness or syncopal symptoms.  Patient is a 79 y.o. female presenting with nosebleeds. The history is provided by the patient and a friend.  Epistaxis     Past Medical History  Diagnosis Date  . Mitral valve disease   . Mitral regurgitation   . Hypothyroidism   . B12 deficiency   . Heart murmur   . Allergy   . OA (osteoarthritis)   . Migraines   . Atrial fibrillation     pt declines anticoag (jehovah's witness)  . Cardiomegaly     on CT scan 03/2011    Past Surgical History  Procedure Date  . Tonsillectomy     Family History  Problem Relation Age of Onset  . Stroke Mother   . Arthritis Father   . Hypertension Mother   . Stomach cancer Father     History  Substance Use Topics  . Smoking status: Former Games developer  . Smokeless tobacco: Never Used  .  Alcohol Use: Yes     Comment: Social    OB History    Grav Para Term Preterm Abortions TAB SAB Ect Mult Living                  Review of Systems  Constitutional: Negative for fever and chills.  HENT: Positive for nosebleeds and sinus pressure.   Respiratory: Negative for cough and shortness of breath.   Gastrointestinal: Negative for nausea, vomiting and abdominal pain.  Neurological: Negative for syncope and light-headedness.    Allergies  Cozaar; Ramipril; Sulfa antibiotics; and Hctz  Home Medications   Current Outpatient Rx  Name  Route  Sig  Dispense  Refill  . ASPIRIN EC 81 MG PO TBEC   Oral   Take 81 mg by mouth daily.           Marland Kitchen VITAMIN D 1000 UNITS PO TABS   Oral   Take 1,000 Units by mouth daily.         Marland Kitchen DIGOXIN 0.125 MG PO TABS      One tablet as directed   30 tablet   5   . LATANOPROST 0.005 % OP SOLN      1 drop in both eyes everyday         . LEVOTHYROXINE SODIUM 50 MCG PO TABS      TAKE ONE TABLET BY  MOUTH ONE TIME DAILY   30 tablet   5   . LISINOPRIL 20 MG PO TABS   Oral   Take 1 tablet (20 mg total) by mouth 2 (two) times daily.   60 tablet   6     New dose   . ATENOLOL 25 MG PO TABS   Oral   Take 25 mg by mouth daily. Taking 1/2 daily           BP 178/80  Pulse 85  Temp 98.5 F (36.9 C) (Oral)  Resp 16  SpO2 97%  Physical Exam  Nursing note and vitals reviewed. Constitutional: She is oriented to person, place, and time. She appears well-developed and well-nourished.  HENT:  Head: Normocephalic and atraumatic.  Nose: Epistaxis is observed.  Mouth/Throat: Uvula is midline, oropharynx is clear and moist and mucous membranes are normal. Mucous membranes are not pale.       Minimal active epistaxis from the right nostril. No significant blood seen in the left nasopharynx. Evidence of some prior bleeding noted in the posterior oropharynx. Airway is intact.  Neck: Normal range of motion. Neck supple.  Cardiovascular:  Normal rate.  An irregularly irregular rhythm present.  Pulmonary/Chest: Effort normal. No respiratory distress. She has no wheezes. She has no rales.  Abdominal: Soft. She exhibits no distension. There is no tenderness.  Musculoskeletal: She exhibits edema.  Neurological: She is alert and oriented to person, place, and time. She has normal strength. She is not disoriented. No cranial nerve deficit. She exhibits normal muscle tone. GCS eye subscore is 4. GCS verbal subscore is 5. GCS motor subscore is 6.  Skin: Skin is warm and dry. No rash noted.  Psychiatric: She has a normal mood and affect.    ED Course  EPISTAXIS MANAGEMENT Date/Time: 07/14/2012 2:00 PM Performed by: Lear Ng. Authorized by: Lear Ng Consent: Verbal consent obtained. Risks and benefits: risks, benefits and alternatives were discussed Consent given by: patient Patient understanding: patient states understanding of the procedure being performed Patient consent: the patient's understanding of the procedure matches consent given Procedure consent: procedure consent matches procedure scheduled Patient identity confirmed: verbally with patient Time out: Immediately prior to procedure a "time out" was called to verify the correct patient, procedure, equipment, support staff and site/side marked as required. Patient sedated: no Treatment site: right anterior Repair method: nasal balloon Post-procedure assessment: bleeding stopped Treatment complexity: simple Recurrence: recurrence of recent bleed Patient tolerance: Patient tolerated the procedure well with no immediate complications. Comments: 1.5 cc of air inflated  EPISTAXIS MANAGEMENT Date/Time: 07/14/2012 4:17 PM Performed by: Lear Ng. Authorized by: Lear Ng Consent: Verbal consent obtained. Risks and benefits: risks, benefits and alternatives were discussed Consent given by: patient Patient understanding: patient states  understanding of the procedure being performed Patient consent: the patient's understanding of the procedure matches consent given Procedure consent: procedure consent matches procedure scheduled Patient identity confirmed: verbally with patient Time out: Immediately prior to procedure a "time out" was called to verify the correct patient, procedure, equipment, support staff and site/side marked as required. Local anesthetic: topical anesthetic Anesthetic total: 2 ml Patient sedated: no Treatment site: right posterior Repair method: nasal balloon Post-procedure assessment: bleeding stopped Treatment complexity: complex   (including critical care time)  Labs Reviewed - No data to display No results found.   1. Epistaxis   2. Hypertension     Room air saturation is 96% and I interpret this to be normal.  2:43 PM Pt is toelrating packing for now.  I think pt's HTN will improve with bleeding stopped.  Pt took meds at home, no evidence of end organ failure from HTN.  Will refer to ENT on call for follow up on Monday.    3:20 PM Slightly still bleeding, added 1 cc more of air, pt reports she is ok and would like to be discharged.  BP is improved.      4:18 PM Continued to bleed around rapid rhino, some minimal bleeding noted on left nares as well.  Decision made to remove anterior 4.5 cm device, and have replaced with 7.5 ant/posterior rapid rhino and inflated with 3 cc of air.  Pt tolerated well.  If pt has improvement in bleeding, can discharge.   MDM  Pt with recurring, persistent epistaxis since 0730.  Given h/o HTN, Afrin is relative contraindicated.  Will place rapid rhino and refer to ENT follow up on Monday if successfully stops bleeding.  Pt is already on abx at home for sinusitis.          Gavin Pound. Oletta Lamas, MD 07/14/12 1503  Gavin Pound. Oletta Lamas, MD 07/14/12 1520  Gavin Pound. Oletta Lamas, MD 07/14/12 [phone]

## 2012-07-14 NOTE — ED Provider Notes (Signed)
450 p.m. Patient alert appropriate his radial home. No further bleeding. Patient advised to stop aspirin for the next 2 days. Saline nasal spray every 2 hours while awake to left Nare.  Doug Sou, MD 07/14/12 [phone]

## 2012-07-14 NOTE — ED Notes (Signed)
4.5cm rapid rhino placed y dr. Oletta Lamas

## 2012-07-15 ENCOUNTER — Encounter (HOSPITAL_COMMUNITY): Payer: Self-pay

## 2012-07-15 ENCOUNTER — Encounter (HOSPITAL_COMMUNITY): Payer: Self-pay | Admitting: Anesthesiology

## 2012-07-15 ENCOUNTER — Encounter (HOSPITAL_COMMUNITY)
Admission: EM | Disposition: A | Discharge status: Home / Self Care | Payer: Self-pay | Source: Home / Self Care | Attending: Emergency Medicine

## 2012-07-15 ENCOUNTER — Emergency Department (HOSPITAL_COMMUNITY): Payer: Medicare Other | Admitting: Anesthesiology

## 2012-07-15 HISTORY — PX: NASAL ENDOSCOPY WITH EPISTAXIS CONTROL: SHX5664

## 2012-07-15 LAB — CBC
Hemoglobin: 14.6 g/dL (ref 12.0–15.0)
MCHC: 34 g/dL (ref 30.0–36.0)
RBC: 4.74 MIL/uL (ref 3.87–5.11)
WBC: 10.6 10*3/uL — ABNORMAL HIGH (ref 4.0–10.5)

## 2012-07-15 LAB — BASIC METABOLIC PANEL
CO2: 26 mEq/L (ref 19–32)
Calcium: 9.3 mg/dL (ref 8.4–10.5)
Chloride: 93 mEq/L — ABNORMAL LOW (ref 96–112)
Potassium: 3.7 mEq/L (ref 3.5–5.1)
Sodium: 132 mEq/L — ABNORMAL LOW (ref 135–145)

## 2012-07-15 LAB — DIGOXIN LEVEL: Digoxin Level: 0.7 ng/mL — ABNORMAL LOW (ref 0.8–2.0)

## 2012-07-15 SURGERY — CONTROL OF EPISTAXIS, ENDOSCOPIC
Anesthesia: General | Wound class: Dirty or Infected

## 2012-07-15 MED ORDER — ONDANSETRON HCL 4 MG/2ML IJ SOLN
INTRAMUSCULAR | Status: DC | PRN
  Administered 2012-07-15: 4 mg via INTRAVENOUS

## 2012-07-15 MED ORDER — AMOXICILLIN-POT CLAVULANATE 875-125 MG PO TABS
1.0000 | ORAL_TABLET | Freq: Two times a day (BID) | ORAL | Status: DC
  Administered 2012-07-15: 1 via ORAL
  Filled 2012-07-15 (×2): qty 1

## 2012-07-15 MED ORDER — DEXTROSE-NACL 5-0.9 % IV SOLN
INTRAVENOUS | Status: DC
  Administered 2012-07-15: 08:00:00 via INTRAVENOUS

## 2012-07-15 MED ORDER — FENTANYL CITRATE 0.05 MG/ML IJ SOLN
INTRAMUSCULAR | Status: DC | PRN
  Administered 2012-07-15 (×2): 25 ug via INTRAVENOUS

## 2012-07-15 MED ORDER — AMOXICILLIN-POT CLAVULANATE 875-125 MG PO TABS: 1.0000 | ORAL_TABLET | Freq: Two times a day (BID) | ORAL | Status: DC

## 2012-07-15 MED ORDER — LIDOCAINE HCL (CARDIAC) 20 MG/ML IV SOLN
INTRAVENOUS | Status: DC | PRN
  Administered 2012-07-15: 50 mg via INTRAVENOUS

## 2012-07-15 MED ORDER — OXYMETAZOLINE HCL 0.05 % NA SOLN
NASAL | Status: AC
  Filled 2012-07-15: qty 15

## 2012-07-15 MED ORDER — LACTATED RINGERS IV SOLN
INTRAVENOUS | Status: DC | PRN
  Administered 2012-07-15: 05:00:00 via INTRAVENOUS

## 2012-07-15 MED ORDER — ATENOLOL 25 MG PO TABS: 25.0000 mg | ORAL_TABLET | Freq: Every day | ORAL | Status: DC

## 2012-07-15 MED ORDER — 0.9 % SODIUM CHLORIDE (POUR BTL) OPTIME
TOPICAL | Status: DC | PRN
  Administered 2012-07-15: 1000 mL

## 2012-07-15 MED ORDER — VITAMIN D3 25 MCG (1000 UNIT) PO TABS
1000.0000 [IU] | ORAL_TABLET | Freq: Every day | ORAL | Status: DC
  Administered 2012-07-15: 1000 [IU] via ORAL
  Filled 2012-07-15: qty 1

## 2012-07-15 MED ORDER — ASPIRIN EC 81 MG PO TBEC
81.0000 mg | DELAYED_RELEASE_TABLET | Freq: Every day | ORAL | Status: DC
  Filled 2012-07-15: qty 1

## 2012-07-15 MED ORDER — LIDOCAINE-EPINEPHRINE 1 %-1:100000 IJ SOLN
INTRAMUSCULAR | Status: AC
  Filled 2012-07-15: qty 1

## 2012-07-15 MED ORDER — PROMETHAZINE HCL 25 MG/ML IJ SOLN: 6.2500 mg | INTRAMUSCULAR | Status: DC | PRN

## 2012-07-15 MED ORDER — OXYMETAZOLINE HCL 0.05 % NA SOLN
1.0000 | Freq: Once | NASAL | Status: AC
  Administered 2012-07-15: 1 via NASAL
  Filled 2012-07-15: qty 15

## 2012-07-15 MED ORDER — DIGOXIN 125 MCG PO TABS
0.1250 mg | ORAL_TABLET | Freq: Every day | ORAL | Status: DC
  Administered 2012-07-15: 0.125 mg via ORAL
  Filled 2012-07-15: qty 1

## 2012-07-15 MED ORDER — LISINOPRIL 20 MG PO TABS
20.0000 mg | ORAL_TABLET | Freq: Two times a day (BID) | ORAL | Status: DC
  Administered 2012-07-15: 20 mg via ORAL
  Filled 2012-07-15 (×2): qty 1

## 2012-07-15 MED ORDER — PROPOFOL 10 MG/ML IV BOLUS
INTRAVENOUS | Status: DC | PRN
  Administered 2012-07-15: 100 mg via INTRAVENOUS

## 2012-07-15 MED ORDER — LIDOCAINE HCL 1 % IJ SOLN
INTRAMUSCULAR | Status: AC
  Filled 2012-07-15: qty 20

## 2012-07-15 MED ORDER — FENTANYL CITRATE 0.05 MG/ML IJ SOLN: 25.0000 ug | INTRAMUSCULAR | Status: DC | PRN

## 2012-07-15 MED ORDER — SUCCINYLCHOLINE CHLORIDE 20 MG/ML IJ SOLN
INTRAMUSCULAR | Status: DC | PRN
  Administered 2012-07-15: 100 mg via INTRAVENOUS

## 2012-07-15 MED ORDER — LABETALOL HCL 5 MG/ML IV SOLN
10.0000 mg | Freq: Once | INTRAVENOUS | Status: AC
  Administered 2012-07-15: 10 mg via INTRAVENOUS
  Filled 2012-07-15: qty 4

## 2012-07-15 MED ORDER — LATANOPROST 0.005 % OP SOLN
1.0000 [drp] | Freq: Every day | OPHTHALMIC | Status: DC
  Filled 2012-07-15: qty 2.5

## 2012-07-15 MED ORDER — ATENOLOL 12.5 MG HALF TABLET
12.5000 mg | ORAL_TABLET | Freq: Every day | ORAL | Status: DC
  Administered 2012-07-15: 12.5 mg via ORAL
  Filled 2012-07-15: qty 1

## 2012-07-15 MED ORDER — IBUPROFEN 100 MG/5ML PO SUSP
400.0000 mg | Freq: Four times a day (QID) | ORAL | Status: DC | PRN
  Filled 2012-07-15: qty 20

## 2012-07-15 SURGICAL SUPPLY — 14 items
DRESSING NASAL POPE 10X1.5X2.5 (GAUZE/BANDAGES/DRESSINGS) IMPLANT
DRSG NASAL POPE 10X1.5X2.5 (GAUZE/BANDAGES/DRESSINGS) ×4
ELECT COATED BLADE 2.86 ST (ELECTRODE) ×1 IMPLANT
GAUZE VASELINE FOILPK 1/2 X 72 (GAUZE/BANDAGES/DRESSINGS) ×2 IMPLANT
GLOVE SURG SS PI 8.5 STRL IVOR (GLOVE) ×2
GLOVE SURG SS PI 8.5 STRL STRW (GLOVE) IMPLANT
GOWN BRE IMP PREV XXLGXLNG (GOWN DISPOSABLE) ×1 IMPLANT
GOWN PREVENTION PLUS XLARGE (GOWN DISPOSABLE) ×1 IMPLANT
HEMOSTAT SURGICEL 2X14 (HEMOSTASIS) ×1 IMPLANT
KIT BASIN OR (CUSTOM PROCEDURE TRAY) ×1 IMPLANT
PACK EENT SPLIT (PACKS) ×1 IMPLANT
PATTIES SURGICAL .5 X.5 (GAUZE/BANDAGES/DRESSINGS) ×1 IMPLANT
SOLUTION ANTI FOG 6CC (MISCELLANEOUS) ×1 IMPLANT
YANKAUER SUCT BULB TIP NO VENT (SUCTIONS) ×1 IMPLANT

## 2012-07-15 NOTE — Op Note (Signed)
OPERATIVE REPORT  DATE OF SURGERY: 07/15/2012  PATIENT:  [name] [name],  79 y.o. female  PRE-OPERATIVE DIAGNOSIS:  Nose Bleed  POST-OPERATIVE DIAGNOSIS:  Nose Bleed  PROCEDURE:  Procedure(s): NASAL ENDOSCOPY WITH EPISTAXIS CONTROL  SURGEON:  Susy Frizzle, MD  ASSISTANTS: none  ANESTHESIA:   General   EBL:  10 ml  DRAINS: none  LOCAL MEDICATIONS USED:  None  SPECIMEN:  none  COUNTS:  Correct  PROCEDURE DETAILS: The patient was taken to the operating room and placed on the operating table in the supine position. Following induction of general endotracheal anesthesia, the table was raised and the patient was draped in a standard fashion. The right nasal packing was removed. The nasal cavity was suctioned of blood clots. A 0 nasal endoscope was then used to inspect the nasal cavity. The previously cauterized site along the attachment of the superior turbinate was recauterized. There was additional bleeding around the inferior aspect of the middle turbinate which was also cauterized. Suction cautery was used at a setting of 15 W. After that Surgicel was cut into a small piece and packed in medial to the middle meatus and up against the superior turbinate. Vaseline gauze was then packed in layers, total of 72 inches long. A long Merocel was cut lengthwise and half and packed in the inferior aspect of the nasal cavity. Saline was used to inflate the pack. The nasopharynx and oropharynx were inspected through the mouth and secretions were suctioned. There was no visible bleeding at this point. Patient was then awakened, extubated and transferred to recovery in stable condition.    PATIENT DISPOSITION:  To PACU, stable

## 2012-07-15 NOTE — Anesthesia Preprocedure Evaluation (Addendum)
Anesthesia Evaluation  Patient identified by MRN, date of birth, ID band Patient awake    Reviewed: Allergy & Precautions, H&P , NPO status , Patient's Chart, lab work & pertinent test results, reviewed documented beta blocker date and time   Airway Mallampati: II TM Distance: >3 FB Neck ROM: full    Dental No notable dental hx.    Pulmonary neg pulmonary ROS,  breath sounds clear to auscultation  Pulmonary exam normal       Cardiovascular Exercise Tolerance: Good hypertension, On Medications and On Home Beta Blockers + dysrhythmias Atrial Fibrillation + Valvular Problems/Murmurs Rhythm:regular Rate:Normal  ECG: A fib, LBBB   Neuro/Psych  Headaches, negative psych ROS   GI/Hepatic Neg liver ROS, GERD-  ,  Endo/Other  Hypothyroidism   Renal/GU negative Renal ROS  negative genitourinary   Musculoskeletal   Abdominal   Peds  Hematology negative hematology ROS (+)   Anesthesia Other Findings   Reproductive/Obstetrics negative OB ROS                           Anesthesia Physical Anesthesia Plan  ASA: III and emergent  Anesthesia Plan: General ETT   Post-op Pain Management:    Induction:   Airway Management Planned:   Additional Equipment:   Intra-op Plan:   Post-operative Plan:   Informed Consent: I have reviewed the patients History and Physical, chart, labs and discussed the procedure including the risks, benefits and alternatives for the proposed anesthesia with the patient or authorized representative who has indicated his/her understanding and acceptance.   Dental Advisory Given  Plan Discussed with: CRNA  Anesthesia Plan Comments: (Jehovah's Witness faith, and she has signed the necessary paperwork to refuse all blood products. Hgb 14.6)      Anesthesia Quick Evaluation

## 2012-07-15 NOTE — Progress Notes (Signed)
Pacu notes:  Pt still continues to bleed from right nasal cavity.  Additional gauze placed.  Pressure held.  Per patient, "she can still feel blood running down back of throat".  Has gone through an entire box of tissues.  Called dr. Pollyann Kennedy and gave above information, as well as advising him of my concerns for patient going home with sister who does not drive and does not appear to be as oriented as the patient.  Dr. Pollyann Kennedy will write admission orders.

## 2012-07-15 NOTE — Consult Note (Signed)
Reason for Consult:Epistaxix Referring Physician: Vida Roller, MD  [name] [name] is an 79 y.o. female.  HPI: 24 hour history of right anterior/posterior epistaxis. She had one not as bad about 5 years ago. She is only on aspirin, no other anticoagulant. No history of nasal pathology or nasal spray use and no trauma. She had an Epistat baloon placed earlier but is still bleeding.   Past Medical History  Diagnosis Date  . Mitral valve disease   . Mitral regurgitation   . Hypothyroidism   . B12 deficiency   . Heart murmur   . Allergy   . OA (osteoarthritis)   . Migraines   . Atrial fibrillation     pt declines anticoag (jehovah's witness)  . Cardiomegaly     on CT scan 03/2011    Past Surgical History  Procedure Date  . Tonsillectomy     Family History  Problem Relation Age of Onset  . Stroke Mother   . Arthritis Father   . Hypertension Mother   . Stomach cancer Father     Social History:  reports that she has quit smoking. She has never used smokeless tobacco. She reports that she drinks alcohol. She reports that she does not use illicit drugs.  Allergies:  Allergies  Allergen Reactions  . Cozaar     insomnia  . Ramipril     dyspnea  . Sulfa Antibiotics   . Hctz (Hydrochlorothiazide) Rash    Medications: Reviewed  Results for orders placed during the hospital encounter of 07/14/12 (from the past 48 hour(s))  CBC     Status: Abnormal   Collection Time   07/15/12 12:37 AM      Component Value Range Comment   WBC 10.6 (*) 4.0 - 10.5 K/uL    RBC 4.74  3.87 - 5.11 MIL/uL    Hemoglobin 14.6  12.0 - 15.0 g/dL    HCT 16.1  09.6 - 04.5 %    MCV 90.5  78.0 - 100.0 fL    MCH 30.8  26.0 - 34.0 pg    MCHC 34.0  30.0 - 36.0 g/dL    RDW 40.9  81.1 - 91.4 %    Platelets 223  150 - 400 K/uL   DIGOXIN LEVEL     Status: Abnormal   Collection Time   07/15/12 12:37 AM      Component Value Range Comment   Digoxin Level 0.7 (*) 0.8 - 2.0 ng/mL   BASIC METABOLIC PANEL      Status: Abnormal   Collection Time   07/15/12 12:37 AM      Component Value Range Comment   Sodium 132 (*) 135 - 145 mEq/L    Potassium 3.7  3.5 - 5.1 mEq/L    Chloride 93 (*) 96 - 112 mEq/L    CO2 26  19 - 32 mEq/L    Glucose, Bld 128 (*) 70 - 99 mg/dL    BUN 40 (*) 6 - 23 mg/dL    Creatinine, Ser 7.82  0.50 - 1.10 mg/dL    Calcium 9.3  8.4 - 95.6 mg/dL    GFR calc non Af Amer 59 (*) >90 mL/min    GFR calc Af Amer 68 (*) >90 mL/min     No results found.  OZH:[id number] except as listed in admit H&P  Blood pressure 195/93, pulse 86, temperature 97.7 F (36.5 C), temperature source Oral, resp. rate 16, SpO2 97.00%.  PHYSICAL EXAM: Overall appearance:  Healthy appearing, in no  distress Head:  Normocephalic, atraumatic. Ears: External ears normal. Nose: External nose is healthy in appearance. Internal nasal exam reveals Epistat balloon on the right. This was removed. The right nasal cavity was suctioned of blood and clot. Topical Afrin/Xylocaine was applied in spray and pledgets. The septum and middle/inferior turbinates were injected with 1% Xylocaine/epi. Endoscopy was performed. Bleeding was identified coming from a small arteriole adjacent to the right superior turbinate. This was cauterized with AgNO3. Surgicel was packed into the area and a Long Merocel was placed. She was observed for about 15 minutes and there was no further bleeding. Oral Cavity:  There are no mucosal lesions or masses identified. Neuro:  No identifiable neurologic deficits. Neck: No palpable neck masses.  Studies Reviewed: none  Procedures: none   Assessment/Plan: Posterior epistaxis, treated with cautery and packing. Continue on Abx for 5 days then follow up in our office Thursday for packing removal. Contact us sooner if any further bleeding.  [name] [name] 07/15/2012, 2:52 AM

## 2012-07-15 NOTE — Progress Notes (Signed)
Assessment unchanged. Pt verbalized understanding of dc instructions. Able to tell nurse when to follow-up with MD. Introduced to My Chart. Script x1 given as provided by MD. Able to verbalize when to call MD if any questions or problems occur. Discharged via wc to front entrance to meet awaiting vehicle and friend to carry home. Accompanied by sister and NT.

## 2012-07-15 NOTE — Plan of Care (Signed)
Problem: Phase I Progression Outcomes Goal: Pain controlled with appropriate interventions Outcome: Completed/Met Date Met:  07/15/12 No complaints of pain since arrival to floor.

## 2012-07-15 NOTE — Progress Notes (Signed)
pacu nursing:  Dr. Okey Dupre aware of elevated blood pressure.  Information acknowledged.  Begin patient on po home meds with transferred to floor

## 2012-07-15 NOTE — Progress Notes (Signed)
Dr. Pollyann Kennedy aware via phone pt's BP better, continuing to trend down. Outer gauze changed again at 1100 and 1300. Small 2x2 saturated each time yet pt doesn't c/o of swallowing blood. Spitting only scant amounts of pink tinged sputum on tissues. Denies pain. VSS. MD ok with letting pt go home today if ride can be arranged otherwise will need to stay overnight. Pt made aware of Dr. Lucky Rathke plan. Pt to let nurse know if able to arrange ride home for possible dc today.

## 2012-07-15 NOTE — Progress Notes (Signed)
Dr. Pollyann Kennedy aware via phone of pt status. Received clarification order for pt's Tenormin dose. BP's trending down. Pt asymptomatic. MD aware pt's outer rt nare gauzed changed recently with minimal amounts of blood on tissues.

## 2012-07-15 NOTE — Progress Notes (Signed)
Dr. Pollyann Kennedy aware pt found transportation home along with sister. Pt's bleeding decreasing. Outer gauze remains at rt nare. VSS. Pt voiding with in normal limits. Up ad lib in room with no complaints voiced. Denies pain. See discharge orders entered into EPIC by MD.

## 2012-07-15 NOTE — Transfer of Care (Signed)
Immediate Anesthesia Transfer of Care Note  Patient: [name] [name]  Procedure(s) Performed: Procedure(s) (LRB) with comments: NASAL ENDOSCOPY WITH EPISTAXIS CONTROL (N/A)  Patient Location: PACU  Anesthesia Type:General  Level of Consciousness: sedated  Airway & Oxygen Therapy: Patient Spontanous Breathing and Patient connected to face mask oxygen  Post-op Assessment: Report given to PACU RN and Post -op Vital signs reviewed and stable  Post vital signs: Reviewed and stable  Complications: No apparent anesthesia complications

## 2012-07-15 NOTE — ED Provider Notes (Signed)
History     CSN: [id number]  Arrival date & time 07/14/12  2235   First MD Initiated Contact with Patient 07/14/12 2355      Chief Complaint  Patient presents with  . Epistaxis    (Consider location/radiation/quality/duration/timing/severity/associated sxs/prior treatment) HPI Comments: Elderly female who presents with recurrent right nasal epistaxis. She was seen earlier today and found to have right epistaxis, 7.5 cm rapid rhino packing was placed and the patient had significant improvement in her symptoms. Just prior to arrival the patient stated that she started to have recurrent bleeding which became heavier going both down her throat and out the front around the packing. This is persistent, severe, nothing seems to make it better or worse. She states that the bleeding started earlier this morning approximately 18 hours ago, no sneezing, no coughing, no picking. This was spontaneous in onset. She does use aspirin.    Patient is a 79 y.o. female presenting with nosebleeds. The history is provided by the patient and medical records.  Epistaxis     Past Medical History  Diagnosis Date  . Mitral valve disease   . Mitral regurgitation   . Hypothyroidism   . B12 deficiency   . Heart murmur   . Allergy   . OA (osteoarthritis)   . Migraines   . Atrial fibrillation     pt declines anticoag (jehovah's witness)  . Cardiomegaly     on CT scan 03/2011    Past Surgical History  Procedure Date  . Tonsillectomy     Family History  Problem Relation Age of Onset  . Stroke Mother   . Arthritis Father   . Hypertension Mother   . Stomach cancer Father     History  Substance Use Topics  . Smoking status: Former Games developer  . Smokeless tobacco: Never Used  . Alcohol Use: Yes     Comment: Social    OB History    Grav Para Term Preterm Abortions TAB SAB Ect Mult Living                  Review of Systems  HENT: Positive for nosebleeds.   All other systems reviewed and are  negative.    Allergies  Cozaar; Ramipril; Sulfa antibiotics; and Hctz  Home Medications   Current Outpatient Rx  Name  Route  Sig  Dispense  Refill  . ASPIRIN EC 81 MG PO TBEC   Oral   Take 81 mg by mouth daily.           Marland Kitchen VITAMIN D 1000 UNITS PO TABS   Oral   Take 1,000 Units by mouth daily.         Marland Kitchen DIGOXIN 0.125 MG PO TABS   Oral   Take 0.125 mg by mouth daily.         Marland Kitchen LATANOPROST 0.005 % OP SOLN      1 drop in both eyes everyday         . LEVOTHYROXINE SODIUM 50 MCG PO TABS      TAKE ONE TABLET BY MOUTH ONE TIME DAILY   30 tablet   5   . LISINOPRIL 20 MG PO TABS   Oral   Take 1 tablet (20 mg total) by mouth 2 (two) times daily.   60 tablet   6     New dose   . AMOXICILLIN-POT CLAVULANATE 875-125 MG PO TABS   Oral   Take 1 tablet by mouth every 12 (twelve) hours.  8 tablet   0   . ATENOLOL 25 MG PO TABS   Oral   Take 25 mg by mouth daily. Taking 1/2 daily           BP 195/93  Pulse 86  Temp 97.7 F (36.5 C) (Oral)  Resp 16  SpO2 97%  Physical Exam  Nursing note and vitals reviewed. Constitutional: She appears well-developed and well-nourished. No distress.  HENT:  Head: Normocephalic and atraumatic.  Mouth/Throat: No oropharyngeal exudate.       No blood in the left nostril, right nostril with packing in place, bleeding coming out briskly around the packing, blood in the posterior pharynx with presence of clot.  Eyes: Conjunctivae normal and EOM are normal. Pupils are equal, round, and reactive to light. Right eye exhibits no discharge. Left eye exhibits no discharge. No scleral icterus.  Neck: Normal range of motion. Neck supple. No JVD present. No thyromegaly present.  Cardiovascular: Normal rate, regular rhythm, normal heart sounds and intact distal pulses.  Exam reveals no gallop and no friction rub.   No murmur heard. Pulmonary/Chest: Effort normal and breath sounds normal. No respiratory distress. She has no wheezes. She  has no rales.  Abdominal: Soft. Bowel sounds are normal. She exhibits no distension and no mass. There is no tenderness.  Musculoskeletal: Normal range of motion. She exhibits no edema and no tenderness.  Lymphadenopathy:    She has no cervical adenopathy.  Neurological: She is alert. Coordination normal.  Skin: Skin is warm and dry. No rash noted. No erythema.  Psychiatric: She has a normal mood and affect. Her behavior is normal.    ED Course  Procedures (including critical care time)  Labs Reviewed  CBC - Abnormal; Notable for the following:    WBC 10.6 (*)     All other components within normal limits  DIGOXIN LEVEL - Abnormal; Notable for the following:    Digoxin Level 0.7 (*)     All other components within normal limits  BASIC METABOLIC PANEL - Abnormal; Notable for the following:    Sodium 132 (*)     Chloride 93 (*)     Glucose, Bld 128 (*)     BUN 40 (*)     GFR calc non Af Amer 59 (*)     GFR calc Af Amer 68 (*)     All other components within normal limits   No results found.   1. Acute posterior epistaxis       MDM  Other than the epistaxis, the patient appears to have hypertension but otherwise stable vital signs. Will check her CBC, treat hypertension, IV be placed and likely will consult ENT for posterior epistaxis   I removed the packing, the patient continued to have posterior bleeding. I consulted Dr. Pollyann Kennedy with ear nose and throat who was able to cauterize a posterior arterial bleed. The patient is stable, no longer bleeding, will be given antibiotics for the next 4 days and followup with Dr. Pollyann Kennedy in the office. He has arty arrange this followup. The patient is stable for discharge, Augmentin for 4 days.     Vida Roller, MD 07/15/12 [phone]

## 2012-07-15 NOTE — Anesthesia Postprocedure Evaluation (Signed)
  Anesthesia Post-op Note  Patient: [name] [name]  Procedure(s) Performed: Procedure(s) (LRB): NASAL ENDOSCOPY WITH EPISTAXIS CONTROL (N/A)  Patient Location: PACU  Anesthesia Type: General  Level of Consciousness: awake and alert   Airway and Oxygen Therapy: Patient Spontanous Breathing  Post-op Pain: mild  Post-op Assessment: Post-op Vital signs reviewed, Patient's Cardiovascular Status Stable, Respiratory Function Stable, Patent Airway and No signs of Nausea or vomiting  Last Vitals:  Filed Vitals:   07/15/12 1115  BP: 149/81  Pulse: 75  Temp: 36.8 C  Resp: 18    Post-op Vital Signs: stable   Complications: No apparent anesthesia complications

## 2012-07-16 ENCOUNTER — Encounter (HOSPITAL_COMMUNITY): Payer: Self-pay | Admitting: Otolaryngology

## 2012-07-16 NOTE — Discharge Summary (Signed)
Physician Discharge Summary  Patient ID: [name] [name] MRN: [id number] DOB/AGE: [birth year] 79 y.o.  Admit date: 07/14/2012 Discharge date: 07/16/2012  Admission Diagnoses:epistaxix and hypertension  Discharge Diagnoses:  Active Problems:  * No active hospital problems. *    Discharged Condition: good  Hospital Course: no complications  Consults: none  Significant Diagnostic Studies: none  Treatments: surgery: nasal endoscopy with treatment of epistaxix  Discharge Exam: Blood pressure 169/96, pulse 85, temperature 98.6 F (37 C), temperature source Oral, resp. rate 18, height 5' (1.524 m), weight 110 lb (49.896 kg), SpO2 94.00%. PHYSICAL EXAM: Pack in place, no more bleeding, blood pressure within normal range  Disposition: 01-Home or Self Care  Discharge Orders    Future Orders Please Complete By Expires   Diet - low sodium heart healthy      Diet - low sodium heart healthy      Increase activity slowly      Increase activity slowly          Medication List     As of 07/16/2012  9:38 AM    TAKE these medications         amoxicillin-clavulanate 875-125 MG per tablet   Commonly known as: AUGMENTIN   Take 1 tablet by mouth every 12 (twelve) hours.      amoxicillin-clavulanate 875-125 MG per tablet   Commonly known as: AUGMENTIN   Take 1 tablet by mouth 2 (two) times daily.      aspirin EC 81 MG tablet   Take 81 mg by mouth daily.      atenolol 25 MG tablet   Commonly known as: TENORMIN   Take 25 mg by mouth daily. Taking 1/2 daily      cholecalciferol 1000 UNITS tablet   Commonly known as: VITAMIN D   Take 1,000 Units by mouth daily.      digoxin 0.125 MG tablet   Commonly known as: LANOXIN   Take 0.125 mg by mouth daily.      latanoprost 0.005 % ophthalmic solution   Commonly known as: XALATAN   1 drop in both eyes everyday      levothyroxine 50 MCG tablet   Commonly known as: SYNTHROID, LEVOTHROID   TAKE ONE TABLET BY MOUTH ONE TIME DAILY     lisinopril 20 MG tablet   Commonly known as: PRINIVIL,ZESTRIL   Take 1 tablet (20 mg total) by mouth 2 (two) times daily.           Follow-up Information    Follow up with Serena Colonel, MD. Schedule an appointment as soon as possible for a visit on 07/19/2012.   Contact information:   [address], [address] [place] [address] [phone]          Signed: Serena Colonel 07/16/2012, 9:38 AM

## 2012-07-25 ENCOUNTER — Other Ambulatory Visit: Payer: Self-pay | Admitting: *Deleted

## 2012-07-25 MED ORDER — ATENOLOL 25 MG PO TABS: ORAL_TABLET | ORAL | Status: DC

## 2012-08-01 ENCOUNTER — Other Ambulatory Visit: Payer: Self-pay | Admitting: *Deleted

## 2012-08-01 MED ORDER — FUROSEMIDE 20 MG PO TABS: 20.0000 mg | ORAL_TABLET | Freq: Every day | ORAL | Status: DC

## 2012-08-10 ENCOUNTER — Other Ambulatory Visit: Payer: Self-pay | Admitting: *Deleted

## 2012-08-10 MED ORDER — DIGOXIN 125 MCG PO TABS: 0.1250 mg | ORAL_TABLET | Freq: Every day | ORAL | Status: DC

## 2012-08-16 ENCOUNTER — Telehealth: Payer: Self-pay | Admitting: Cardiology

## 2012-08-16 MED ORDER — ETHACRYNIC ACID 25 MG PO TABS: ORAL_TABLET | ORAL | Status: DC

## 2012-08-16 NOTE — Telephone Encounter (Signed)
Can try edecrin 25 mg tablet one or two daily prn swelling.

## 2012-08-16 NOTE — Telephone Encounter (Signed)
New Problem:    Patient called in because she is allergic to sulfur and has found a medication that does not have sulfur in it (Edecrin/ Ethacryme acid) and would like to see if she would be able to switch to taking it.  Please call back.

## 2012-08-16 NOTE — Telephone Encounter (Signed)
Did try the furosemide and patient states she had awful itching. Will forward to  Dr. Patty Sermons for review

## 2012-08-16 NOTE — Telephone Encounter (Signed)
Advised patient and will send to [facility]

## 2012-09-04 ENCOUNTER — Encounter: Payer: Self-pay | Admitting: Cardiology

## 2012-09-04 ENCOUNTER — Ambulatory Visit (INDEPENDENT_AMBULATORY_CARE_PROVIDER_SITE_OTHER): Payer: Medicare Other | Admitting: Cardiology

## 2012-09-04 VITALS — BP 130/78 | HR 60 | Ht 60.0 in | Wt 107.4 lb

## 2012-09-04 DIAGNOSIS — I4891 Unspecified atrial fibrillation: Secondary | ICD-10-CM

## 2012-09-04 DIAGNOSIS — I119 Hypertensive heart disease without heart failure: Secondary | ICD-10-CM

## 2012-09-04 DIAGNOSIS — I38 Endocarditis, valve unspecified: Secondary | ICD-10-CM

## 2012-09-04 DIAGNOSIS — R0989 Other specified symptoms and signs involving the circulatory and respiratory systems: Secondary | ICD-10-CM

## 2012-09-04 DIAGNOSIS — I10 Essential (primary) hypertension: Secondary | ICD-10-CM

## 2012-09-04 NOTE — Assessment & Plan Note (Signed)
Pressure was remaining stable on current therapy.  Since we last saw her she did have an episode of epistaxis requiring hospitalization by Dr. Pollyann Kennedy.

## 2012-09-04 NOTE — Assessment & Plan Note (Signed)
The patient is not on anticoagulation.  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  She was taking a baby aspirin daily.  She has not had any TIA or stroke symptoms.

## 2012-09-04 NOTE — Patient Instructions (Addendum)
Will obtain labs today and call you with the results (BMET)  Your physician recommends that you continue on your current medications as directed. Please refer to the Current Medication list given to you today.  Your physician wants you to follow-up in: 4 MONTH OV/EKG You will receive a reminder letter in the mail two months in advance. If you don't receive a letter, please call our office to schedule the follow-up appointment.

## 2012-09-04 NOTE — Progress Notes (Signed)
[name] [name] Date of Birth:  06-25-1929 [facility] [address] [place], [place]  [address] [phone]         Fax   [phone]  History of Present Illness: This pleasant 79 year old woman is seen for followup of her chronic atrial fibrillation. She has a history of chronic atrial fibrillation and essential hypertension. She also has pulmonary hypertension. She has known mild aortic insufficiency and mild mitral regurgitation as well as mild to moderate tricuspid regurgitation with elevated pulmonary artery pressure of 55 noted on echocardiogram 08/12/11.  Since last visit she has been doing reasonably well with no significant change in symptoms.   Current Outpatient Prescriptions  Medication Sig Dispense Refill  . aspirin EC 81 MG EC tablet Take 81 mg by mouth daily.        Marland Kitchen atenolol (TENORMIN) 25 MG tablet Take 1/2 tablet daily or as directed.  30 tablet  5  . cholecalciferol (VITAMIN D) 1000 UNITS tablet Take 1,000 Units by mouth daily.      . digoxin (LANOXIN) 0.125 MG tablet Take 1 tablet (0.125 mg total) by mouth daily.  30 tablet  3  . ethacrynic acid (EDECRIN) 25 MG tablet 1 to 2 tablets daily as needed for swelling  60 tablet  2  . latanoprost (XALATAN) 0.005 % ophthalmic solution 1 drop in both eyes everyday      . levothyroxine (SYNTHROID, LEVOTHROID) 50 MCG tablet TAKE ONE TABLET BY MOUTH ONE TIME DAILY  30 tablet  5  . lisinopril (PRINIVIL,ZESTRIL) 20 MG tablet Take 1 tablet (20 mg total) by mouth 2 (two) times daily.  60 tablet  6   No current facility-administered medications for this visit.    Allergies  Allergen Reactions  . Cozaar     insomnia  . Lasix (Furosemide)     Itching   . Ramipril     dyspnea  . Sulfa Antibiotics   . Hctz (Hydrochlorothiazide) Rash    Patient Active Problem List  Diagnosis  . HYPOTHYROIDISM  . B12 DEFICIENCY  . COMMON MIGRAINE  . HYPERTENSION  . Atrial fibrillation  . ALLERGIC RHINITIS  .  HEMATURIA UNSPECIFIED  . OSTEOARTHRITIS  . GERD  . Left bundle branch block  . Edema  . Valvular heart disease    History  Smoking status  . Former Smoker  Smokeless tobacco  . Never Used    History  Alcohol Use  . Yes    Comment: Social    Family History  Problem Relation Age of Onset  . Stroke Mother   . Arthritis Father   . Hypertension Mother   . Stomach cancer Father     Review of Systems: Constitutional: no fever chills diaphoresis or fatigue or change in weight.  Head and neck: no hearing loss, no epistaxis, no photophobia or visual disturbance. Respiratory: No cough, shortness of breath or wheezing. Cardiovascular: No chest pain peripheral edema, palpitations. Gastrointestinal: No abdominal distention, no abdominal pain, no change in bowel habits hematochezia or melena. Genitourinary: No dysuria, no frequency, no urgency, no nocturia. Musculoskeletal:No arthralgias, no back pain, no gait disturbance or myalgias. Neurological: No dizziness, no headaches, no numbness, no seizures, no syncope, no weakness, no tremors. Hematologic: No lymphadenopathy, no easy bruising. Psychiatric: No confusion, no hallucinations, no sleep disturbance.    Physical Exam: Filed Vitals:   09/04/12 1552  BP: 130/78  Pulse: 60   the general appearance reveals a thin elderly woman in no acute distress.The head and neck  exam reveals pupils equal and reactive.  Extraocular movements are full.  There is no scleral icterus.  The mouth and pharynx are normal.  The neck is supple.  The carotids reveal no bruits.  The jugular venous pressure is normal.  The  thyroid is not enlarged.  There is no lymphadenopathy.  The chest is clear to percussion and auscultation.  There are no rales or rhonchi.  Expansion of the chest is symmetrical.  The precordium is quiet.  Pulse is irregular The first heart sound is normal.  The second heart sound is physiologically split.  There is no  gallop rub or click.   There is a soft murmur of aortic insufficiency at the left sternal edge and a grade 2/6 apical murmur of mitral regurgitation. There is no abnormal lift or heave.  The abdomen is soft and nontender.  The bowel sounds are normal.  The liver and spleen are not enlarged.  There are no abdominal masses.  There are no abdominal bruits.  Extremities reveal good pedal pulses.  There is 1+ pedal edema bilaterally.  There is no cyanosis or clubbing.  Strength is normal and symmetrical in all extremities.  There is no lateralizing weakness.  There are no sensory deficits.  The skin is warm and dry.  There is no rash.     Assessment / Plan: The patient is to continue same medication.  A basal metabolic panel is being drawn today.  Recheck in 4 months for office visit EKG and she will get a 2-D echo prior to her next visit.

## 2012-09-04 NOTE — Assessment & Plan Note (Signed)
Patient has had a history of fluid retention secondary to her multi-valvular heart disease and to her atrial fibrillation.  He was not responding to furosemide but has noted improved diuresis with Edecrin 25 mg daily.  We are checking a basal metabolic panel today

## 2012-09-05 ENCOUNTER — Telehealth: Payer: Self-pay | Admitting: *Deleted

## 2012-09-05 LAB — BASIC METABOLIC PANEL
BUN: 31 mg/dL — ABNORMAL HIGH (ref 6–23)
Chloride: 97 mEq/L (ref 96–112)
Creatinine, Ser: 1.3 mg/dL — ABNORMAL HIGH (ref 0.4–1.2)

## 2012-09-05 NOTE — Telephone Encounter (Signed)
Message copied by Burnell Blanks on Wed Sep 05, 2012  5:56 PM ------      Message from: Cassell Clement      Created: Wed Sep 05, 2012  5:12 PM       Please report to patient.  The recent labs are stable. Continue same medication and careful diet.  Kidney function is stable on present dose of Edecrin.  Potassium is good. ------

## 2012-09-05 NOTE — Telephone Encounter (Signed)
Advised patient of lab results  

## 2012-09-05 NOTE — Progress Notes (Signed)
Quick Note:  Please report to patient. The recent labs are stable. Continue same medication and careful diet. Kidney function is stable on present dose of Edecrin. Potassium is good. ______

## 2012-09-17 ENCOUNTER — Ambulatory Visit: Payer: Medicare Other | Admitting: Cardiology

## 2012-09-17 ENCOUNTER — Other Ambulatory Visit: Payer: Self-pay | Admitting: *Deleted

## 2012-09-17 DIAGNOSIS — I119 Hypertensive heart disease without heart failure: Secondary | ICD-10-CM

## 2012-09-17 MED ORDER — LISINOPRIL 20 MG PO TABS: 20.0000 mg | ORAL_TABLET | Freq: Two times a day (BID) | ORAL | Status: DC

## 2012-12-10 ENCOUNTER — Other Ambulatory Visit: Payer: Self-pay | Admitting: *Deleted

## 2012-12-10 MED ORDER — DIGOXIN 125 MCG PO TABS: 0.1250 mg | ORAL_TABLET | Freq: Every day | ORAL | Status: DC

## 2012-12-24 ENCOUNTER — Ambulatory Visit (HOSPITAL_COMMUNITY): Payer: Medicare Other | Attending: Cardiology | Admitting: Radiology

## 2012-12-24 DIAGNOSIS — R0602 Shortness of breath: Secondary | ICD-10-CM | POA: Insufficient documentation

## 2012-12-24 DIAGNOSIS — I359 Nonrheumatic aortic valve disorder, unspecified: Secondary | ICD-10-CM | POA: Insufficient documentation

## 2012-12-24 DIAGNOSIS — R011 Cardiac murmur, unspecified: Secondary | ICD-10-CM

## 2012-12-24 DIAGNOSIS — I059 Rheumatic mitral valve disease, unspecified: Secondary | ICD-10-CM | POA: Insufficient documentation

## 2012-12-24 DIAGNOSIS — I4891 Unspecified atrial fibrillation: Secondary | ICD-10-CM | POA: Insufficient documentation

## 2012-12-24 DIAGNOSIS — I38 Endocarditis, valve unspecified: Secondary | ICD-10-CM

## 2012-12-24 DIAGNOSIS — I079 Rheumatic tricuspid valve disease, unspecified: Secondary | ICD-10-CM | POA: Insufficient documentation

## 2012-12-24 DIAGNOSIS — I369 Nonrheumatic tricuspid valve disorder, unspecified: Secondary | ICD-10-CM

## 2012-12-24 NOTE — Progress Notes (Signed)
Echocardiogram performed.  

## 2012-12-25 NOTE — Progress Notes (Signed)
Quick Note:  Please make copy of echo for patient visit. ______ 

## 2012-12-31 ENCOUNTER — Encounter: Payer: Self-pay | Admitting: Cardiology

## 2012-12-31 ENCOUNTER — Ambulatory Visit (INDEPENDENT_AMBULATORY_CARE_PROVIDER_SITE_OTHER): Payer: Medicare Other | Admitting: Cardiology

## 2012-12-31 VITALS — BP 160/90 | HR 62 | Ht 60.0 in | Wt 106.8 lb

## 2012-12-31 DIAGNOSIS — I071 Rheumatic tricuspid insufficiency: Secondary | ICD-10-CM | POA: Insufficient documentation

## 2012-12-31 DIAGNOSIS — I272 Pulmonary hypertension, unspecified: Secondary | ICD-10-CM

## 2012-12-31 DIAGNOSIS — I2789 Other specified pulmonary heart diseases: Secondary | ICD-10-CM

## 2012-12-31 DIAGNOSIS — I4891 Unspecified atrial fibrillation: Secondary | ICD-10-CM

## 2012-12-31 DIAGNOSIS — I10 Essential (primary) hypertension: Secondary | ICD-10-CM

## 2012-12-31 DIAGNOSIS — R609 Edema, unspecified: Secondary | ICD-10-CM

## 2012-12-31 DIAGNOSIS — I079 Rheumatic tricuspid valve disease, unspecified: Secondary | ICD-10-CM

## 2012-12-31 MED ORDER — AMLODIPINE BESYLATE 2.5 MG PO TABS: 2.5000 mg | ORAL_TABLET | Freq: Every day | ORAL | Status: DC

## 2012-12-31 MED ORDER — DIPHENHYDRAMINE-APAP (SLEEP) 25-500 MG PO TABS: 1.0000 | ORAL_TABLET | Freq: Every evening | ORAL | Status: DC | PRN

## 2012-12-31 NOTE — Progress Notes (Signed)
[name] [name] Date of Birth:  January 08, 1930 [facility] [address] [place], [place]  [address] [phone]         Fax   [phone]  History of Present Illness: This pleasant 79 year old woman is seen for followup of her chronic atrial fibrillation. She has a history of chronic atrial fibrillation and essential hypertension. She also has pulmonary hypertension.  She is a Scientist, product/process development.  She refuses anticoagulation for her atrial fibrillation because she is concerned about bleeding.  She had recent echocardiogram on 12/24/12 showing an ejection fraction of 55-60% with diastolic dysfunction.  There was a restrictive mitral inflow.  Her peak PA pressure was elevated at 74 there was moderate tricuspid regurgitation.  Both of her right and left atria were severely dilated.  She has only mild mitral regurgitation and mild aortic regurgitation. Unfortunately the patient is allergic to medication containing sulfa and so she is not able to take very much furosemide because it causes a rash.  Her blood pressure has been running high recently.  She has not been sleeping well at night.  She denies any orthopnea or paroxysmal nocturnal dyspnea and she does have chronic bilateral peripheral edema.  She is very mild renal insufficiency.   Current Outpatient Prescriptions  Medication Sig Dispense Refill  . aspirin EC 81 MG EC tablet Take 81 mg by mouth daily.        Marland Kitchen atenolol (TENORMIN) 25 MG tablet Take 1/2 tablet daily or as directed.  30 tablet  5  . B Complex Vitamins (VITAMIN-B COMPLEX PO) Take by mouth.      . cholecalciferol (VITAMIN D) 1000 UNITS tablet Take 1,000 Units by mouth daily.      . digoxin (LANOXIN) 0.125 MG tablet Take 1 tablet (0.125 mg total) by mouth daily.  30 tablet  3  . furosemide (LASIX) 20 MG tablet Take 20 mg by mouth as needed.       Marland Kitchen levothyroxine (SYNTHROID, LEVOTHROID) 50 MCG tablet TAKE ONE TABLET BY MOUTH ONE TIME DAILY  30 tablet  5  .  lisinopril (PRINIVIL,ZESTRIL) 20 MG tablet Take 1 tablet (20 mg total) by mouth 2 (two) times daily.  60 tablet  6  . amLODipine (NORVASC) 2.5 MG tablet Take 1 tablet (2.5 mg total) by mouth daily.  30 tablet  6  . diphenhydramine-acetaminophen (TYLENOL PM) 25-500 MG TABS Take 1 tablet by mouth at bedtime as needed.  30 tablet  1   No current facility-administered medications for this visit.    Allergies  Allergen Reactions  . Cozaar     insomnia  . Lasix (Furosemide)     Itching   . Ramipril     dyspnea  . Sulfa Antibiotics   . Hctz (Hydrochlorothiazide) Rash    Patient Active Problem List   Diagnosis Date Noted  . Left bundle branch block 09/06/2010    Priority: High  . Atrial fibrillation 07/25/2008    Priority: High  . HYPOTHYROIDISM 07/25/2008    Priority: Medium  . ALLERGIC RHINITIS 07/25/2008    Priority: Medium  . COMMON MIGRAINE 07/25/2008    Priority: Low  . Tricuspid regurgitation 12/31/2012  . Pulmonary hypertension 12/31/2012  . Valvular heart disease 10/08/2011  . Edema 08/01/2011  . GERD 06/28/2010  . B12 DEFICIENCY 02/09/2009  . HEMATURIA UNSPECIFIED 08/14/2008  . HYPERTENSION 07/25/2008  . OSTEOARTHRITIS 07/25/2008    History  Smoking status  . Former Smoker  Smokeless tobacco  . Never Used  History  Alcohol Use  . Yes    Comment: Social    Family History  Problem Relation Age of Onset  . Stroke Mother   . Arthritis Father   . Hypertension Mother   . Stomach cancer Father     Review of Systems: Constitutional: no fever chills diaphoresis or fatigue or change in weight.  Head and neck: no hearing loss, no epistaxis, no photophobia or visual disturbance. Respiratory: No cough, shortness of breath or wheezing. Cardiovascular: No chest pain peripheral edema, palpitations. Gastrointestinal: No abdominal distention, no abdominal pain, no change in bowel habits hematochezia or melena. Genitourinary: No dysuria, no frequency, no urgency,  no nocturia. Musculoskeletal:No arthralgias, no back pain, no gait disturbance or myalgias. Neurological: No dizziness, no headaches, no numbness, no seizures, no syncope, no weakness, no tremors. Hematologic: No lymphadenopathy, no easy bruising. Psychiatric: No confusion, no hallucinations, no sleep disturbance.    Physical Exam: Filed Vitals:   12/31/12 1024  BP: 160/90  Pulse: 62   the general appearance reveals a well-developed elderly woman in no distress.The head and neck exam reveals pupils equal and reactive.  Extraocular movements are full.  There is no scleral icterus.  The mouth and pharynx are normal.  The neck is supple.  The carotids reveal no bruits.  The jugular venous pressure is normal.  The  thyroid is not enlarged.  There is no lymphadenopathy.  The chest is clear to percussion and auscultation.  There are no rales or rhonchi.  Expansion of the chest is symmetrical.  The precordium is quiet.  The pulse is irregular The first heart sound is normal.  The second heart sound is physiologically split.  There is no  gallop rub or click.  There is a soft systolic murmur There is no abnormal lift or heave.  The abdomen is soft and nontender.  The bowel sounds are normal.  The liver and spleen are not enlarged.  There are no abdominal masses.  There are no abdominal bruits.  Extremities reveal good pedal pulses.  There is bilateral pretibial and pedal edema.  There is no cyanosis or clubbing.  Strength is normal and symmetrical in all extremities.  There is no lateralizing weakness.  There are no sensory deficits.  The skin is warm and dry.  There is no rash.   EKG shows atrial fibrillation with controlled ventricular response and a pattern of left bundle branch block.  No change since 05/14/12  Assessment / Plan: Continue same medication.  Had amlodipine 2.5 mg daily for blood pressure.  Try to increase frequency of Lasix.  4 mild insomnia she can try some over-the-counter Tylenol PM.   She rechecked in 4 months for office visit and basal metabolic panel

## 2012-12-31 NOTE — Assessment & Plan Note (Signed)
Blood pressure is running high today.  We will add low-dose amlodipine 2.5 mg daily.  Her blood pressure would also be improved if she could take her Lasix more often.  Hopefully this low dose amlodipine will not significantly worsen her peripheral edema.

## 2012-12-31 NOTE — Patient Instructions (Addendum)
Your physician wants you to follow-up in: 4 months  You will receive a reminder letter in the mail two months in advance. If you don't receive a letter, please call our office to schedule the follow-up appointment.  Your physician recommends that you return for lab work in: 4 months bmet    Your physician has recommended you make the following change in your medication:   Start amlodipine 2.5 mg daily Try OTC tylenol pm for insomnia

## 2012-12-31 NOTE — Assessment & Plan Note (Signed)
Her weight is down 1 pound since last visit.  She still has significant peripheral edema.  She will try taking her low dose Lasix more frequently.

## 2012-12-31 NOTE — Assessment & Plan Note (Signed)
The patient has not had any TIA symptoms.  She has not any racing of her heart and her ventricular response today is satisfactory.

## 2013-04-09 ENCOUNTER — Other Ambulatory Visit: Payer: Self-pay | Admitting: Cardiology

## 2013-04-26 ENCOUNTER — Other Ambulatory Visit: Payer: Self-pay | Admitting: Cardiology

## 2013-05-06 ENCOUNTER — Ambulatory Visit (INDEPENDENT_AMBULATORY_CARE_PROVIDER_SITE_OTHER): Payer: Medicare Other | Admitting: Cardiology

## 2013-05-06 ENCOUNTER — Encounter (INDEPENDENT_AMBULATORY_CARE_PROVIDER_SITE_OTHER): Payer: Self-pay

## 2013-05-06 VITALS — BP 150/85 | HR 70 | Ht 60.0 in | Wt 110.6 lb

## 2013-05-06 DIAGNOSIS — I38 Endocarditis, valve unspecified: Secondary | ICD-10-CM

## 2013-05-06 DIAGNOSIS — I119 Hypertensive heart disease without heart failure: Secondary | ICD-10-CM

## 2013-05-06 DIAGNOSIS — R0609 Other forms of dyspnea: Secondary | ICD-10-CM

## 2013-05-06 DIAGNOSIS — I071 Rheumatic tricuspid insufficiency: Secondary | ICD-10-CM

## 2013-05-06 DIAGNOSIS — I10 Essential (primary) hypertension: Secondary | ICD-10-CM

## 2013-05-06 DIAGNOSIS — I079 Rheumatic tricuspid valve disease, unspecified: Secondary | ICD-10-CM

## 2013-05-06 DIAGNOSIS — I4891 Unspecified atrial fibrillation: Secondary | ICD-10-CM

## 2013-05-06 NOTE — Assessment & Plan Note (Signed)
The patient has established permanent atrial fibrillation.  She has biatrial enlargement.  She refuses anticoagulation.  She is on a baby aspirin daily.  She has not had any TIA or stroke symptoms.

## 2013-05-06 NOTE — Assessment & Plan Note (Signed)
The patient has multinodular heart disease.  Her dyspnea on exertion is about the same as last time.  She has only been taking her Lasix about once a week

## 2013-05-06 NOTE — Patient Instructions (Signed)
STOP AMLODIPINE   AVOID SALT IN FOOD  Your physician recommends that you schedule a follow-up appointment in: 4 month ov/bmet

## 2013-05-06 NOTE — Progress Notes (Signed)
[name] [name] Date of Birth:  October 10, 1929 [address] [facility] [address] [place], [place]  [address] [phone]         Fax   [phone]  History of Present Illness: This pleasant 79 year old woman is seen for followup of her chronic atrial fibrillation. She has a history of chronic atrial fibrillation and essential hypertension. She also has pulmonary hypertension.  She is a Scientist, product/process development.  She refuses anticoagulation for her atrial fibrillation because she is concerned about bleeding.  She had recent echocardiogram on 12/24/12 showing an ejection fraction of 55-60% with diastolic dysfunction.  There was a restrictive mitral inflow.  Her peak PA pressure was elevated at 74 there was moderate tricuspid regurgitation.  Both of her right and left atria were severely dilated.  She has only mild mitral regurgitation and mild aortic regurgitation. Unfortunately the patient is allergic to medication containing sulfa and so she is not able to take very much furosemide because it causes a rash.  Her blood pressure has been running high recently.  She has not been sleeping well at night.  She denies any orthopnea or paroxysmal nocturnal dyspnea and she does have chronic bilateral peripheral edema.  She is very mild renal insufficiency.  At her last visit we added amlodipine for blood pressure.  However she had side effects and was unable to take it.  It caused her to have pain in her jaw.   Current Outpatient Prescriptions  Medication Sig Dispense Refill  . aspirin EC 81 MG EC tablet Take 81 mg by mouth daily.        Marland Kitchen atenolol (TENORMIN) 25 MG tablet Take 1/2 tablet daily or as directed.  30 tablet  5  . B Complex Vitamins (VITAMIN-B COMPLEX PO) Take by mouth.      . cholecalciferol (VITAMIN D) 1000 UNITS tablet Take 1,000 Units by mouth daily.      . digoxin (LANOXIN) 0.125 MG tablet TAKE ONE TABLET BY MOUTH ONE TIME DAILY   30 tablet  2  . diphenhydramine-acetaminophen (TYLENOL PM) 25-500 MG  TABS Take 1 tablet by mouth at bedtime as needed.  30 tablet  1  . furosemide (LASIX) 20 MG tablet Take 20 mg by mouth as needed.       Marland Kitchen levothyroxine (SYNTHROID, LEVOTHROID) 50 MCG tablet TAKE ONE TABLET BY MOUTH ONE TIME DAILY  30 tablet  5  . lisinopril (PRINIVIL,ZESTRIL) 20 MG tablet Take one tablet by mouth twice daily  60 tablet  0   No current facility-administered medications for this visit.    Allergies  Allergen Reactions  . Cozaar     insomnia  . Lasix [Furosemide]     Itching   . Ramipril     dyspnea  . Sulfa Antibiotics   . Hctz [Hydrochlorothiazide] Rash    Patient Active Problem List   Diagnosis Date Noted  . Left bundle branch block 09/06/2010    Priority: High  . Atrial fibrillation 07/25/2008    Priority: High  . HYPOTHYROIDISM 07/25/2008    Priority: Medium  . ALLERGIC RHINITIS 07/25/2008    Priority: Medium  . COMMON MIGRAINE 07/25/2008    Priority: Low  . Tricuspid regurgitation 12/31/2012  . Pulmonary hypertension 12/31/2012  . Valvular heart disease 10/08/2011  . Edema 08/01/2011  . GERD 06/28/2010  . B12 DEFICIENCY 02/09/2009  . HEMATURIA UNSPECIFIED 08/14/2008  . HYPERTENSION 07/25/2008  . OSTEOARTHRITIS 07/25/2008    History  Smoking status  . Former Smoker  Smokeless tobacco  .  Never Used    History  Alcohol Use  . Yes    Comment: Social    Family History  Problem Relation Age of Onset  . Stroke Mother   . Arthritis Father   . Hypertension Mother   . Stomach cancer Father     Review of Systems: Constitutional: no fever chills diaphoresis or fatigue or change in weight.  Head and neck: no hearing loss, no epistaxis, no photophobia or visual disturbance. Respiratory: No cough, shortness of breath or wheezing. Cardiovascular: No chest pain peripheral edema, palpitations. Gastrointestinal: No abdominal distention, no abdominal pain, no change in bowel habits hematochezia or melena. Genitourinary: No dysuria, no frequency,  no urgency, no nocturia. Musculoskeletal:No arthralgias, no back pain, no gait disturbance or myalgias. Neurological: No dizziness, no headaches, no numbness, no seizures, no syncope, no weakness, no tremors. Hematologic: No lymphadenopathy, no easy bruising. Psychiatric: No confusion, no hallucinations, no sleep disturbance.    Physical Exam: Filed Vitals:   05/06/13 1055  BP: 150/85  Pulse:    the general appearance reveals a well-developed elderly woman in no distress.The head and neck exam reveals pupils equal and reactive.  Extraocular movements are full.  There is no scleral icterus.  The mouth and pharynx are normal.  The neck is supple.  The carotids reveal no bruits.  The jugular venous pressure is normal.  The  thyroid is not enlarged.  There is no lymphadenopathy.  The chest is clear to percussion and auscultation.  There are no rales or rhonchi.  Expansion of the chest is symmetrical.  The precordium is quiet.  The pulse is irregular The first heart sound is normal.  The second heart sound is physiologically split.  There is no  gallop rub or click.  There is a grade 2/6 systolic ejection murmur at the left sternal edge. There is no abnormal lift or heave.  The abdomen is soft and nontender.  The bowel sounds are normal.  The liver and spleen are not enlarged.  There are no abdominal masses.  There are no abdominal bruits.  Extremities reveal good pedal pulses.  There is bilateral pretibial and pedal edema.  There is no cyanosis or clubbing.  Strength is normal and symmetrical in all extremities.  There is no lateralizing weakness.  There are no sensory deficits.  The skin is warm and dry.  There is no rash.   Assessment / Plan: Continue same medication except stop amlodipine because of side effects.  Continue on careful low salt diet.  Try to take the Lasix more than once a week.  Her dermatologist suggested that she use support hose on her legs and I would concur with that. She has a  past history of dilutional hyponatremia.  We will plan to see her in 4 months for office visit and basal metabolic panel

## 2013-05-06 NOTE — Assessment & Plan Note (Signed)
Blood pressure continues to run somewhat high.  She is intolerant to many medications.  She is trying to limit salt in her food.  Her weight is up 4 pounds since last visit and her edema is about the same.

## 2013-06-07 ENCOUNTER — Other Ambulatory Visit: Payer: Self-pay | Admitting: *Deleted

## 2013-06-07 MED ORDER — LISINOPRIL 20 MG PO TABS: ORAL_TABLET | ORAL | Status: DC

## 2013-07-01 ENCOUNTER — Other Ambulatory Visit: Payer: Self-pay | Admitting: Cardiology

## 2013-07-06 ENCOUNTER — Other Ambulatory Visit: Payer: Self-pay | Admitting: Cardiology

## 2013-09-06 ENCOUNTER — Ambulatory Visit (INDEPENDENT_AMBULATORY_CARE_PROVIDER_SITE_OTHER): Payer: Medicare HMO | Admitting: Cardiology

## 2013-09-06 ENCOUNTER — Ambulatory Visit
Admission: RE | Admit: 2013-09-06 | Discharge: 2013-09-06 | Disposition: A | Discharge status: Home / Self Care | Payer: Medicare HMO | Source: Ambulatory Visit | Attending: Cardiology | Admitting: Cardiology

## 2013-09-06 ENCOUNTER — Encounter: Payer: Self-pay | Admitting: Cardiology

## 2013-09-06 VITALS — BP 128/94 | HR 68 | Ht 60.0 in | Wt 100.0 lb

## 2013-09-06 DIAGNOSIS — I079 Rheumatic tricuspid valve disease, unspecified: Secondary | ICD-10-CM

## 2013-09-06 DIAGNOSIS — R609 Edema, unspecified: Secondary | ICD-10-CM

## 2013-09-06 DIAGNOSIS — R0609 Other forms of dyspnea: Secondary | ICD-10-CM

## 2013-09-06 DIAGNOSIS — R0989 Other specified symptoms and signs involving the circulatory and respiratory systems: Secondary | ICD-10-CM

## 2013-09-06 DIAGNOSIS — I38 Endocarditis, valve unspecified: Secondary | ICD-10-CM

## 2013-09-06 DIAGNOSIS — I10 Essential (primary) hypertension: Secondary | ICD-10-CM

## 2013-09-06 DIAGNOSIS — I071 Rheumatic tricuspid insufficiency: Secondary | ICD-10-CM

## 2013-09-06 NOTE — Progress Notes (Signed)
[name] [name] Date of Birth:  May 26, 1930 [address] [place], [place]  [address] [phone]         Fax   [phone]  History of Present Illness: This pleasant 80 year old woman is seen for followup of her chronic atrial fibrillation. She has a history of chronic atrial fibrillation and essential hypertension. She also has pulmonary hypertension.  She is a Restaurant manager, fast food.  She refuses anticoagulation for her atrial fibrillation because she is concerned about bleeding.  She had recent echocardiogram on 12/24/12 showing an ejection fraction of 40-34% with diastolic dysfunction.  There was a restrictive mitral inflow.  Her peak PA pressure was elevated at 74 there was moderate tricuspid regurgitation.  Both of her right and left atria were severely dilated.  She has only mild mitral regurgitation and mild aortic regurgitation. Since last visit she saw her PCP Dr. Stephanie Acre who diagnosed fluid overload and placed her on spironolactone.  The patient promptly diuresed 10 pounds and feels better.  She is tolerating the spironolactone without apparent side effects.   Current Outpatient Prescriptions  Medication Sig Dispense Refill  . aspirin 81 MG tablet Take 81 mg by mouth daily.      Marland Kitchen atenolol (TENORMIN) 25 MG tablet Take 1/2 tablet by mouth every day or as directed.  30 tablet  4  . B Complex Vitamins (VITAMIN-B COMPLEX PO) Take by mouth.      . cholecalciferol (VITAMIN D) 1000 UNITS tablet Take 1,000 Units by mouth daily.      . digoxin (LANOXIN) 0.125 MG tablet TAKE ONE TABLET BY MOUTH ONE TIME DAILY   30 tablet  3  . levothyroxine (SYNTHROID, LEVOTHROID) 50 MCG tablet TAKE ONE TABLET BY MOUTH ONE TIME DAILY  30 tablet  5  . lisinopril (PRINIVIL,ZESTRIL) 20 MG tablet Take one tablet by mouth twice daily  60 tablet  2  . spironolactone (ALDACTONE) 25 MG tablet Take 25 mg by mouth 2 (two) times daily.        No current facility-administered medications for this visit.      Allergies  Allergen Reactions  . Amlodipine     Pain in jaw, and peripheral edema  . Cozaar     insomnia  . Lasix [Furosemide]     Itching   . Ramipril     dyspnea  . Sulfa Antibiotics   . Hctz [Hydrochlorothiazide] Rash    Patient Active Problem List   Diagnosis Date Noted  . Left bundle branch block 09/06/2010    Priority: High  . Atrial fibrillation 07/25/2008    Priority: High  . HYPOTHYROIDISM 07/25/2008    Priority: Medium  . ALLERGIC RHINITIS 07/25/2008    Priority: Medium  . COMMON MIGRAINE 07/25/2008    Priority: Low  . Tricuspid regurgitation 12/31/2012  . Pulmonary hypertension 12/31/2012  . Valvular heart disease 10/08/2011  . Edema 08/01/2011  . GERD 06/28/2010  . B12 DEFICIENCY 02/09/2009  . HEMATURIA UNSPECIFIED 08/14/2008  . HYPERTENSION 07/25/2008  . OSTEOARTHRITIS 07/25/2008    History  Smoking status  . Former Smoker  Smokeless tobacco  . Never Used    History  Alcohol Use  . Yes    Comment: Social    Family History  Problem Relation Age of Onset  . Stroke Mother   . Arthritis Father   . Hypertension Mother   . Stomach cancer Father     Review of Systems: Constitutional: no fever chills diaphoresis or fatigue or change in weight.  Head and neck: no hearing loss, no epistaxis, no photophobia or visual disturbance. Respiratory: No cough, shortness of breath or wheezing. Cardiovascular: No chest pain peripheral edema, palpitations. Gastrointestinal: No abdominal distention, no abdominal pain, no change in bowel habits hematochezia or melena. Genitourinary: No dysuria, no frequency, no urgency, no nocturia. Musculoskeletal:No arthralgias, no back pain, no gait disturbance or myalgias. Neurological: No dizziness, no headaches, no numbness, no seizures, no syncope, no weakness, no tremors. Hematologic: No lymphadenopathy, no easy bruising. Psychiatric: No confusion, no hallucinations, no sleep disturbance.    Physical  Exam: Filed Vitals:   09/06/13 1051  BP: 128/94  Pulse: 68   the general appearance reveals a well-developed elderly woman in no distress.The head and neck exam reveals pupils equal and reactive.  Extraocular movements are full.  There is no scleral icterus.  The mouth and pharynx are normal.  The neck is supple.  The carotids reveal no bruits.  The jugular venous pressure is normal.  The  thyroid is not enlarged.  There is no lymphadenopathy.  The chest reveals musical rhonchi.  Expansion of the chest is symmetrical.  The precordium is quiet.  The pulse is irregular The first heart sound is normal.  The second heart sound is physiologically split.  There is no  gallop rub or click.  There is a grade 2/6 systolic ejection murmur at the left sternal edge. There is no abnormal lift or heave.  The abdomen is soft and nontender.  The bowel sounds are normal.  The liver and spleen are not enlarged.  There are no abdominal masses.  There are no abdominal bruits.  Extremities reveal good pedal pulses.  There is no significant pitting edema.  She does have thick ankles however  There is no cyanosis or clubbing.  Strength is normal and symmetrical in all extremities.  There is no lateralizing weakness.  There are no sensory deficits.  The skin is warm and dry.  There is no rash.   Assessment / Plan: We will update her chest x-ray.  She will continue same medication for now.  She continues on a baby aspirin daily since she refuses warfarin. Recheck in 4 months for followup office visit and EKG

## 2013-09-06 NOTE — Assessment & Plan Note (Signed)
She states that when she checks her blood pressure at home, the left arm is always higher than the right.  I checked both arms today and they are approximately equal at 140/90.

## 2013-09-06 NOTE — Patient Instructions (Signed)
Would like for you to go soon to [place] at the [facility] soon for a chest xray  Your physician recommends that you continue on your current medications as directed. Please refer to the Current Medication list given to you today.  Your physician wants you to follow-up in: 4 month ov You will receive a reminder letter in the mail two months in advance. If you don't receive a letter, please call our office to schedule the follow-up appointment.

## 2013-09-06 NOTE — Assessment & Plan Note (Signed)
Her edema has improved since last visit.

## 2013-09-06 NOTE — Assessment & Plan Note (Signed)
Patient has a multinodular heart disease with tricuspid regurgitation, aortic insufficiency, and mitral regurgitation.  Her chest x-rays in the past have shown significant cardiomegaly.  She still has complaints of exertional dyspnea and no energy at times.  She has not been expressing any chest pain.

## 2013-09-09 ENCOUNTER — Telehealth: Payer: Self-pay | Admitting: *Deleted

## 2013-09-09 ENCOUNTER — Other Ambulatory Visit: Payer: Self-pay | Admitting: Cardiology

## 2013-09-09 NOTE — Telephone Encounter (Signed)
Advised patient of results.  

## 2013-09-09 NOTE — Telephone Encounter (Signed)
Message copied by Earvin Hansen on Mon Sep 09, 2013 10:57 AM ------      Message from: Darlin Coco      Created: Fri Sep 06, 2013  7:15 PM       Please report.  The chest x-ray is stable.  The heart size is stable.  The lungs were clear.  No fluid.  Continue current medication ------

## 2013-11-05 ENCOUNTER — Other Ambulatory Visit: Payer: Self-pay | Admitting: Cardiology

## 2014-01-04 ENCOUNTER — Other Ambulatory Visit: Payer: Self-pay | Admitting: Cardiology

## 2014-01-06 ENCOUNTER — Encounter: Payer: Self-pay | Admitting: Cardiology

## 2014-01-06 ENCOUNTER — Ambulatory Visit (INDEPENDENT_AMBULATORY_CARE_PROVIDER_SITE_OTHER): Payer: Medicare HMO | Admitting: Cardiology

## 2014-01-06 VITALS — BP 144/96 | HR 71 | Ht 60.0 in | Wt 105.0 lb

## 2014-01-06 DIAGNOSIS — R0602 Shortness of breath: Secondary | ICD-10-CM

## 2014-01-06 DIAGNOSIS — I10 Essential (primary) hypertension: Secondary | ICD-10-CM

## 2014-01-06 DIAGNOSIS — I071 Rheumatic tricuspid insufficiency: Secondary | ICD-10-CM

## 2014-01-06 DIAGNOSIS — I482 Chronic atrial fibrillation, unspecified: Secondary | ICD-10-CM

## 2014-01-06 DIAGNOSIS — R609 Edema, unspecified: Secondary | ICD-10-CM

## 2014-01-06 DIAGNOSIS — R0989 Other specified symptoms and signs involving the circulatory and respiratory systems: Secondary | ICD-10-CM

## 2014-01-06 DIAGNOSIS — I079 Rheumatic tricuspid valve disease, unspecified: Secondary | ICD-10-CM

## 2014-01-06 DIAGNOSIS — R0609 Other forms of dyspnea: Secondary | ICD-10-CM

## 2014-01-06 DIAGNOSIS — I4891 Unspecified atrial fibrillation: Secondary | ICD-10-CM

## 2014-01-06 MED ORDER — ETHACRYNIC ACID 25 MG PO TABS: ORAL_TABLET | ORAL | Status: DC

## 2014-01-06 NOTE — Patient Instructions (Addendum)
START EDECRIN 25 MG ON Monday AND Thursday ONLY, RX SENT TO [facility]  Your physician recommends that you schedule a follow-up appointment in: 4 MONTH OV/BMET

## 2014-01-06 NOTE — Assessment & Plan Note (Signed)
Her blood pressure is higher today reflecting increased intravascular volume.  This should improve with additional endocrine diuresis

## 2014-01-06 NOTE — Assessment & Plan Note (Signed)
Her edema has worsened since last visit.  We will have her start Edecrin 25 mg twice a week on Monday and Thursday to serve as a booster for her spironolactone.

## 2014-01-06 NOTE — Progress Notes (Signed)
[name] [name] Date of Birth:  1930/02/11 [address] [place], [place]  [address] [phone]        Fax   [phone]   History of Present Illness: This pleasant 80 year old woman is seen for followup of her chronic atrial fibrillation. She has a history of chronic atrial fibrillation and essential hypertension. She also has pulmonary hypertension. She is a Restaurant manager, fast food. She refuses anticoagulation for her atrial fibrillation because she is concerned about bleeding. She had recent echocardiogram on 12/24/12 showing an ejection fraction of 22-02% with diastolic dysfunction. There was a restrictive mitral inflow. Her peak PA pressure was elevated at 74 there was moderate tricuspid regurgitation. Both of her right and left atria were severely dilated. She has only mild mitral regurgitation and mild aortic regurgitation.  Prior to last visit she saw her PCP Dr. Stephanie Acre who diagnosed fluid overload and placed her on spironolactone. The patient promptly diuresed 10 pounds and felt better.  However she now has regained much of the previous weight.  Her weight is up 5 pounds since last visit.  She has had increasing edema and has been more fatigued.   Current Outpatient Prescriptions  Medication Sig Dispense Refill  . aspirin 81 MG tablet Take 81 mg by mouth daily.      Marland Kitchen atenolol (TENORMIN) 25 MG tablet Take 1/2 tablet by mouth every day or as directed.  30 tablet  4  . B Complex Vitamins (VITAMIN-B COMPLEX PO) Take by mouth.      . cholecalciferol (VITAMIN D) 1000 UNITS tablet Take 1,000 Units by mouth daily.      . digoxin (LANOXIN) 0.125 MG tablet Take 1 tablet by mouth daily. Needs office visit.  30 tablet  2  . levothyroxine (SYNTHROID, LEVOTHROID) 50 MCG tablet TAKE ONE TABLET BY MOUTH ONE TIME DAILY  30 tablet  5  . lisinopril (PRINIVIL,ZESTRIL) 20 MG tablet TAKE ONE TABLET BY MOUTH TWICE DAILY   60 tablet  3  . spironolactone (ALDACTONE) 25 MG  tablet Take 25 mg by mouth 2 (two) times daily.       Marland Kitchen ethacrynic acid (EDECRIN) 25 MG tablet 1 TABLET ON Monday AND Thursday ONLY  15 tablet  2   No current facility-administered medications for this visit.    Allergies  Allergen Reactions  . Amlodipine     Pain in jaw, and peripheral edema  . Cozaar     insomnia  . Lasix [Furosemide]     Itching   . Ramipril     dyspnea  . Sulfa Antibiotics   . Hctz [Hydrochlorothiazide] Rash    Patient Active Problem List   Diagnosis Date Noted  . Left bundle branch block 09/06/2010    Priority: High  . Atrial fibrillation 07/25/2008    Priority: High  . HYPOTHYROIDISM 07/25/2008    Priority: Medium  . ALLERGIC RHINITIS 07/25/2008    Priority: Medium  . COMMON MIGRAINE 07/25/2008    Priority: Low  . Tricuspid regurgitation 12/31/2012  . Pulmonary hypertension 12/31/2012  . Valvular heart disease 10/08/2011  . Edema 08/01/2011  . GERD 06/28/2010  . B12 DEFICIENCY 02/09/2009  . HEMATURIA UNSPECIFIED 08/14/2008  . HYPERTENSION 07/25/2008  . OSTEOARTHRITIS 07/25/2008    History  Smoking status  . Former Smoker  Smokeless tobacco  . Never Used    History  Alcohol Use  . Yes    Comment: Social    Family History  Problem Relation Age of  Onset  . Stroke Mother   . Arthritis Father   . Hypertension Mother   . Stomach cancer Father     Review of Systems: Constitutional: no fever chills diaphoresis or fatigue or change in weight.  Head and neck: no hearing loss, no epistaxis, no photophobia or visual disturbance. Respiratory: No cough, shortness of breath or wheezing. Cardiovascular: No chest pain peripheral edema, palpitations. Gastrointestinal: No abdominal distention, no abdominal pain, no change in bowel habits hematochezia or melena. Genitourinary: No dysuria, no frequency, no urgency, no nocturia. Musculoskeletal:No arthralgias, no back pain, no gait disturbance or myalgias. Neurological: No dizziness, no  headaches, no numbness, no seizures, no syncope, no weakness, no tremors. Hematologic: No lymphadenopathy, no easy bruising. Psychiatric: No confusion, no hallucinations, no sleep disturbance.    Physical Exam: Filed Vitals:   01/06/14 1120  BP: 144/96  Pulse: 71   the general appearance reveals a well-developed well-nourished elderly woman in no acute distressThe head and neck exam reveals pupils equal and reactive.  Extraocular movements are full.  There is no scleral icterus.  The mouth and pharynx are normal.  The neck is supple.  The carotids reveal no bruits.  The jugular venous pressure is mildly elevated.  The  thyroid is not enlarged.  There is no lymphadenopathy.  The chest is clear to percussion and auscultation.  There are no rales or rhonchi.  Expansion of the chest is symmetrical.  The precordium is quiet.  The pulse is irregularly irregular.  The first heart sound is normal.  The second heart sound is physiologically split.  There is a grade 2/6 systolic ejection murmur at the left sternal edge. There is no abnormal lift or heave.  The abdomen is soft and nontender.  The bowel sounds are normal.  The liver and spleen are not enlarged.  There are no abdominal masses.  There are no abdominal bruits.  Extremities reveal good pedal pulses.  There is 2+ pitting edema.  There is no cyanosis or clubbing.  Strength is normal and symmetrical in all extremities.  There is no lateralizing weakness.  There are no sensory deficits.  The skin is warm and dry.  There is no rash.   EKG shows atrial fibrillation right axis deviation nonspecific interventricular block and is unchanged since 12/31/12  Assessment / Plan: 1. permanent atrial fibrillation not on anticoagulation because of patient preference ([employer]) 2. multi-valvular heart disease with tricuspid regurgitation, pulmonary hypertension, aortic insufficiency, and mitral regurgitation. 3.  Hypertensive heart disease 4. peripheral  edema 5. normal left ventricular function by echocardiogram 12/24/12 with ejection fraction 55-60% and there is diastolic dysfunction  Plan: We will add Edecrin 25 mg on Mondays and Thursdays.  We have to avoid pharmaceutical drugs which contain sulfa. Recheck in 4 months for office visit and basal metabolic panel.

## 2014-01-06 NOTE — Assessment & Plan Note (Signed)
The patient is in permanent atrial flutter fibrillation.  Not on anticoagulation because she refuses anticoagulation.  She has not had any TIA or stroke symptoms.  She is on a baby aspirin.[name] [name]

## 2014-01-07 ENCOUNTER — Telehealth: Payer: Self-pay | Admitting: Cardiology

## 2014-01-07 DIAGNOSIS — Z79899 Other long term (current) drug therapy: Secondary | ICD-10-CM

## 2014-01-07 NOTE — Telephone Encounter (Signed)
Left message to call back  

## 2014-01-07 NOTE — Telephone Encounter (Signed)
New message     Pt saw Dr Mare Ferrari yesterday.  Pt cannot take edecrin--it makes her throw up. What else can she take.

## 2014-01-07 NOTE — Telephone Encounter (Signed)
Will forward to  Dr. Brackbill for review 

## 2014-01-07 NOTE — Telephone Encounter (Signed)
Increase spironolactone to 25 mg 3 times a day.  Stop endocrine since it makes her sick.  Return for a basal metabolic panel in one week after increasing spironolactone.

## 2014-01-08 MED ORDER — SPIRONOLACTONE 25 MG PO TABS: 25.0000 mg | ORAL_TABLET | Freq: Three times a day (TID) | ORAL | Status: DC

## 2014-01-08 NOTE — Telephone Encounter (Signed)
Advised patient of lab results and scheduled lab next week.

## 2014-01-13 ENCOUNTER — Telehealth: Payer: Self-pay | Admitting: Cardiology

## 2014-01-13 NOTE — Telephone Encounter (Signed)
New problem    Pt had labs done at PCP office.

## 2014-01-15 ENCOUNTER — Other Ambulatory Visit: Payer: Medicare HMO

## 2014-01-16 NOTE — Telephone Encounter (Signed)
Labs reviewed by  Dr. Mare Ferrari

## 2014-01-20 NOTE — Telephone Encounter (Signed)
Advised patient, verbalized understanding  

## 2014-01-20 NOTE — Telephone Encounter (Signed)
Labs reviewed by  Dr. Mare Ferrari and will have patient continue the decrease Aldactone to twice a day as recommended by PCP. Left message to call back

## 2014-02-11 ENCOUNTER — Other Ambulatory Visit: Payer: Self-pay | Admitting: *Deleted

## 2014-02-11 MED ORDER — DIGOXIN 125 MCG PO TABS: ORAL_TABLET | ORAL | Status: DC

## 2014-03-04 ENCOUNTER — Other Ambulatory Visit: Payer: Self-pay

## 2014-03-04 MED ORDER — LISINOPRIL 20 MG PO TABS: ORAL_TABLET | ORAL | Status: DC

## 2014-04-25 ENCOUNTER — Ambulatory Visit: Payer: Medicare HMO | Admitting: Cardiology

## 2014-04-25 ENCOUNTER — Other Ambulatory Visit: Payer: Medicare HMO

## 2014-05-07 ENCOUNTER — Encounter: Payer: Self-pay | Admitting: Cardiology

## 2014-05-07 ENCOUNTER — Other Ambulatory Visit (INDEPENDENT_AMBULATORY_CARE_PROVIDER_SITE_OTHER): Payer: Medicare HMO | Admitting: *Deleted

## 2014-05-07 ENCOUNTER — Ambulatory Visit (INDEPENDENT_AMBULATORY_CARE_PROVIDER_SITE_OTHER): Payer: Medicare HMO | Admitting: Cardiology

## 2014-05-07 VITALS — BP 148/82 | HR 73 | Ht 60.0 in | Wt 110.0 lb

## 2014-05-07 DIAGNOSIS — I482 Chronic atrial fibrillation, unspecified: Secondary | ICD-10-CM

## 2014-05-07 DIAGNOSIS — R609 Edema, unspecified: Secondary | ICD-10-CM

## 2014-05-07 DIAGNOSIS — I10 Essential (primary) hypertension: Secondary | ICD-10-CM

## 2014-05-07 DIAGNOSIS — Z79899 Other long term (current) drug therapy: Secondary | ICD-10-CM

## 2014-05-07 LAB — BASIC METABOLIC PANEL
BUN: 33 mg/dL — AB (ref 6–23)
CALCIUM: 9.3 mg/dL (ref 8.4–10.5)
CO2: 25 mEq/L (ref 19–32)
Chloride: 88 mEq/L — ABNORMAL LOW (ref 96–112)
Creatinine, Ser: 1.3 mg/dL — ABNORMAL HIGH (ref 0.4–1.2)
GFR: 41.61 mL/min — AB (ref >60.00)
GLUCOSE: 73 mg/dL (ref 70–99)
Potassium: 4.7 mEq/L (ref 3.5–5.1)
SODIUM: 122 meq/L — AB (ref 135–145)

## 2014-05-07 NOTE — Assessment & Plan Note (Signed)
The patient has not had any TIA or stroke symptoms.

## 2014-05-07 NOTE — Assessment & Plan Note (Addendum)
The patient continues to have a lot of significant ankle edema.  She is watching her dietary salt intake carefully.

## 2014-05-07 NOTE — Patient Instructions (Signed)
GET LABS TODAY BMET  Your physician recommends that you schedule a follow-up appointment in: 4 MONTHS (EKG).  Your physician recommends that you continue on your current medications as directed. Please refer to the Current Medication list given to you today.

## 2014-05-07 NOTE — Assessment & Plan Note (Signed)
The patient has not been having any dizzy spells or syncope.  She does complain of easy fatigue.

## 2014-05-07 NOTE — Progress Notes (Signed)
[name] [name] Date of Birth:  March 31, 1930 [place] [address] [place], [place]  [address] [phone]        Fax   [phone]   History of Present Illness: This pleasant 80 year old woman is seen for followup of her chronic atrial fibrillation.  She is a medical patient of Dr. Stephanie Acre. She has a history of chronic atrial fibrillation and essential hypertension. She also has pulmonary hypertension. She is a Restaurant manager, fast food. She refuses anticoagulation for her atrial fibrillation because she is concerned about bleeding. She had an echocardiogram on 12/24/12 showing an ejection fraction of 10-62% with diastolic dysfunction. There was a restrictive mitral inflow. Her peak PA pressure was elevated at 74 there was moderate tricuspid regurgitation. Both of her right and left atria were severely dilated. She has only mild mitral regurgitation and mild aortic regurgitation.  She has a history of fluid retention.  Treatment is complicated by the fact that the patient is allergic to sulfa.  She has been tolerating spironolactone 25 mg twice a day.  She is also on Maxide which contains a small amount of thiazide but she does not appear to be having any allergic reaction from it.  Since last visit her weight is up 5 pounds.  The patient has tried support hose for her peripheral edema but does not like it.   Current Outpatient Prescriptions  Medication Sig Dispense Refill  . amoxicillin (AMOXIL) 875 MG tablet     . aspirin 81 MG tablet Take 81 mg by mouth daily.    Marland Kitchen atenolol (TENORMIN) 25 MG tablet Take 1/2 tablet by mouth every day or as directed. 30 tablet 4  . B Complex Vitamins (VITAMIN-B COMPLEX PO) Take by mouth.    . cholecalciferol (VITAMIN D) 1000 UNITS tablet Take 1,000 Units by mouth daily.    . digoxin (LANOXIN) 0.125 MG tablet Take 1 tablet by mouth daily 30 tablet 2  . latanoprost (XALATAN) 0.005 % ophthalmic solution Place 1 drop into both eyes daily.       Marland Kitchen levothyroxine (SYNTHROID, LEVOTHROID) 50 MCG tablet TAKE ONE TABLET BY MOUTH ONE TIME DAILY 30 tablet 5  . lisinopril (PRINIVIL,ZESTRIL) 20 MG tablet TAKE ONE TABLET BY MOUTH TWICE DAILY 180 tablet 0  . spironolactone (ALDACTONE) 25 MG tablet Take 25 mg by mouth 2 (two) times daily.    Marland Kitchen triamterene-hydrochlorothiazide (MAXZIDE-25) 37.5-25 MG per tablet Take 1 tablet by mouth as needed.      No current facility-administered medications for this visit.    Allergies  Allergen Reactions  . Amlodipine     Pain in jaw, and peripheral edema  . Cozaar     insomnia  . Edecrin [Ethacrynic Acid]     N&V   . Lasix [Furosemide]     Itching   . Ramipril     dyspnea  . Sulfa Antibiotics   . Hctz [Hydrochlorothiazide] Rash    Patient Active Problem List   Diagnosis Date Noted  . Left bundle branch block 09/06/2010    Priority: High  . Atrial fibrillation 07/25/2008    Priority: High  . HYPOTHYROIDISM 07/25/2008    Priority: Medium  . ALLERGIC RHINITIS 07/25/2008    Priority: Medium  . COMMON MIGRAINE 07/25/2008    Priority: Low  . Tricuspid regurgitation 12/31/2012  . Pulmonary hypertension 12/31/2012  . Valvular heart disease 10/08/2011  . Edema 08/01/2011  . GERD 06/28/2010  . B12 DEFICIENCY 02/09/2009  . HEMATURIA UNSPECIFIED 08/14/2008  .  HYPERTENSION 07/25/2008  . OSTEOARTHRITIS 07/25/2008    History  Smoking status  . Former Smoker  Smokeless tobacco  . Never Used    History  Alcohol Use  . Yes    Comment: Social    Family History  Problem Relation Age of Onset  . Stroke Mother   . Arthritis Father   . Hypertension Mother   . Stomach cancer Father     Review of Systems: Constitutional: no fever chills diaphoresis or fatigue or change in weight.  Head and neck: no hearing loss, no epistaxis, no photophobia or visual disturbance. Respiratory: No cough, shortness of breath or wheezing. Cardiovascular: No chest pain peripheral edema,  palpitations. Gastrointestinal: No abdominal distention, no abdominal pain, no change in bowel habits hematochezia or melena. Genitourinary: No dysuria, no frequency, no urgency, no nocturia. Musculoskeletal:No arthralgias, no back pain, no gait disturbance or myalgias. Neurological: No dizziness, no headaches, no numbness, no seizures, no syncope, no weakness, no tremors. Hematologic: No lymphadenopathy, no easy bruising. Psychiatric: No confusion, no hallucinations, no sleep disturbance.    Physical Exam: Filed Vitals:   05/07/14 1101  BP: 148/82  Pulse: 73   the general appearance reveals a well-developed well-nourished elderly woman in no acute distressThe head and neck exam reveals pupils equal and reactive.  Extraocular movements are full.  There is no scleral icterus.  The mouth and pharynx are normal.  The neck is supple.  The carotids reveal no bruits.  The jugular venous pressure is mildly elevated.  The  thyroid is not enlarged.  There is no lymphadenopathy.  The chest is clear to percussion and auscultation.  There are no rales or rhonchi.  Expansion of the chest is symmetrical.  The precordium is quiet.  The pulse is irregularly irregular.  The first heart sound is normal.  The second heart sound is physiologically split.  There is a grade 2/6 systolic ejection murmur at the left sternal edge. There is no abnormal lift or heave.  The abdomen is soft and nontender.  The bowel sounds are normal.  The liver and spleen are not enlarged.  There are no abdominal masses.  There are no abdominal bruits.  Extremities reveal good pedal pulses.  There is 2+ pitting edema.  There is no cyanosis or clubbing.  Strength is normal and symmetrical in all extremities.  There is no lateralizing weakness.  There are no sensory deficits.  The skin is warm and dry.  There is no rash.    Assessment / Plan: 1. permanent atrial fibrillation not on anticoagulation because of patient preference ([employer]'s  Witness) 2. multi-valvular heart disease with tricuspid regurgitation, pulmonary hypertension, aortic insufficiency, and mitral regurgitation. 3.  Hypertensive heart disease 4. peripheral edema 5. normal left ventricular function by echocardiogram 12/24/12 with ejection fraction 55-60% and there is diastolic dysfunction  Plan: Continue current medication.  She may wish to try using support hose again We are checking a basal metabolic panel today for serum potassium.  She is on spironolactone. Recheck in 4 months for office visit and EKG

## 2014-05-12 ENCOUNTER — Other Ambulatory Visit: Payer: Self-pay | Admitting: Cardiology

## 2014-05-12 ENCOUNTER — Telehealth: Payer: Self-pay | Admitting: *Deleted

## 2014-05-12 DIAGNOSIS — Z79899 Other long term (current) drug therapy: Secondary | ICD-10-CM

## 2014-05-12 MED ORDER — FUROSEMIDE 40 MG PO TABS: ORAL_TABLET | ORAL | Status: DC

## 2014-05-12 MED ORDER — ATENOLOL 25 MG PO TABS: ORAL_TABLET | ORAL | Status: DC

## 2014-05-12 NOTE — Telephone Encounter (Signed)
-----   Message from Darlin Coco, MD sent at 05/07/2014  8:46 PM EST ----- Please report. The serum sodium is too low. This is consistent with dilutional hyponatremia. I would like her to restrict her fluids to 1500 cc/day. Also she should take a lasix 40 mg tab once a week. She may still have some lasix on hand at home from before. She may have to tolerate slight side effect of itching. Her current diuretic spironolactone by itself does not appear to be sufficient. Recheck BMET in 2 weeks

## 2014-05-12 NOTE — Telephone Encounter (Signed)
Advised patient and scheduled labs 

## 2014-05-15 ENCOUNTER — Other Ambulatory Visit: Payer: Self-pay | Admitting: Cardiology

## 2014-05-26 ENCOUNTER — Other Ambulatory Visit (INDEPENDENT_AMBULATORY_CARE_PROVIDER_SITE_OTHER): Payer: Medicare HMO | Admitting: *Deleted

## 2014-05-26 DIAGNOSIS — Z79899 Other long term (current) drug therapy: Secondary | ICD-10-CM

## 2014-05-26 LAB — BASIC METABOLIC PANEL
BUN: 32 mg/dL — ABNORMAL HIGH (ref 6–23)
CALCIUM: 9.4 mg/dL (ref 8.4–10.5)
CHLORIDE: 93 meq/L — AB (ref 96–112)
CO2: 26 mEq/L (ref 19–32)
CREATININE: 1.3 mg/dL — AB (ref 0.4–1.2)
GFR: 40.18 mL/min — AB (ref >60.00)
Glucose, Bld: 104 mg/dL — ABNORMAL HIGH (ref 70–99)
Potassium: 4.7 mEq/L (ref 3.5–5.1)
Sodium: 127 mEq/L — ABNORMAL LOW (ref 135–145)

## 2014-05-26 NOTE — Progress Notes (Signed)
Quick Note:  Please report to patient. The recent labs are stable. Continue same medication and careful diet. Serum sodium is better. ______

## 2014-06-02 ENCOUNTER — Telehealth: Payer: Self-pay

## 2014-06-02 NOTE — Telephone Encounter (Signed)
Patient informed of labs. Sodium up to 127mg . States she is having le edema. Inquiring if she can take one extra Lasix a week, instead of just one.

## 2014-06-02 NOTE — Telephone Encounter (Signed)
Yes that we'll be okay to take an extra Lasix

## 2014-06-03 NOTE — Telephone Encounter (Signed)
Patient informed OK to take an extra dose of Lasix during the week.

## 2014-06-07 ENCOUNTER — Encounter (HOSPITAL_COMMUNITY): Payer: Self-pay | Admitting: *Deleted

## 2014-06-07 ENCOUNTER — Inpatient Hospital Stay (HOSPITAL_COMMUNITY)
Admission: EM | Admit: 2014-06-07 | Discharge: 2014-06-14 | DRG: 292 | Disposition: A | Discharge status: Home / Self Care | Payer: Medicare HMO | Attending: Internal Medicine | Admitting: Internal Medicine

## 2014-06-07 ENCOUNTER — Emergency Department (HOSPITAL_COMMUNITY): Payer: Medicare HMO

## 2014-06-07 DIAGNOSIS — Z888 Allergy status to other drugs, medicaments and biological substances status: Secondary | ICD-10-CM

## 2014-06-07 DIAGNOSIS — E039 Hypothyroidism, unspecified: Secondary | ICD-10-CM | POA: Diagnosis present

## 2014-06-07 DIAGNOSIS — I482 Chronic atrial fibrillation, unspecified: Secondary | ICD-10-CM | POA: Diagnosis present

## 2014-06-07 DIAGNOSIS — R079 Chest pain, unspecified: Secondary | ICD-10-CM | POA: Diagnosis present

## 2014-06-07 DIAGNOSIS — G43909 Migraine, unspecified, not intractable, without status migrainosus: Secondary | ICD-10-CM | POA: Diagnosis present

## 2014-06-07 DIAGNOSIS — I5033 Acute on chronic diastolic (congestive) heart failure: Principal | ICD-10-CM | POA: Diagnosis present

## 2014-06-07 DIAGNOSIS — Z882 Allergy status to sulfonamides status: Secondary | ICD-10-CM

## 2014-06-07 DIAGNOSIS — N183 Chronic kidney disease, stage 3 (moderate): Secondary | ICD-10-CM | POA: Diagnosis present

## 2014-06-07 DIAGNOSIS — I083 Combined rheumatic disorders of mitral, aortic and tricuspid valves: Secondary | ICD-10-CM | POA: Diagnosis present

## 2014-06-07 DIAGNOSIS — E871 Hypo-osmolality and hyponatremia: Secondary | ICD-10-CM | POA: Diagnosis present

## 2014-06-07 DIAGNOSIS — R7989 Other specified abnormal findings of blood chemistry: Secondary | ICD-10-CM | POA: Diagnosis present

## 2014-06-07 DIAGNOSIS — R778 Other specified abnormalities of plasma proteins: Secondary | ICD-10-CM | POA: Diagnosis present

## 2014-06-07 DIAGNOSIS — Z87891 Personal history of nicotine dependence: Secondary | ICD-10-CM

## 2014-06-07 DIAGNOSIS — I447 Left bundle-branch block, unspecified: Secondary | ICD-10-CM | POA: Diagnosis present

## 2014-06-07 DIAGNOSIS — I129 Hypertensive chronic kidney disease with stage 1 through stage 4 chronic kidney disease, or unspecified chronic kidney disease: Secondary | ICD-10-CM | POA: Diagnosis present

## 2014-06-07 DIAGNOSIS — I272 Other secondary pulmonary hypertension: Secondary | ICD-10-CM | POA: Diagnosis present

## 2014-06-07 DIAGNOSIS — I10 Essential (primary) hypertension: Secondary | ICD-10-CM | POA: Diagnosis present

## 2014-06-07 DIAGNOSIS — Z7982 Long term (current) use of aspirin: Secondary | ICD-10-CM

## 2014-06-07 DIAGNOSIS — I160 Hypertensive urgency: Secondary | ICD-10-CM | POA: Diagnosis present

## 2014-06-07 LAB — CBC WITH DIFFERENTIAL/PLATELET
BASOS PCT: 0 % (ref 0–1)
Basophils Absolute: 0 10*3/uL (ref 0.0–0.1)
EOS ABS: 0 10*3/uL (ref 0.0–0.7)
EOS PCT: 0 % (ref 0–5)
HEMATOCRIT: 45.1 % (ref 36.0–46.0)
HEMOGLOBIN: 14.8 g/dL (ref 12.0–15.0)
Lymphocytes Relative: 9 % — ABNORMAL LOW (ref 12–46)
Lymphs Abs: 0.6 10*3/uL — ABNORMAL LOW (ref 0.7–4.0)
MCH: 29.8 pg (ref 26.0–34.0)
MCHC: 32.8 g/dL (ref 30.0–36.0)
MCV: 90.7 fL (ref 78.0–100.0)
MONO ABS: 0.5 10*3/uL (ref 0.1–1.0)
MONOS PCT: 7 % (ref 3–12)
Neutro Abs: 6 10*3/uL (ref 1.7–7.7)
Neutrophils Relative %: 84 % — ABNORMAL HIGH (ref 43–77)
Platelets: 268 10*3/uL (ref 150–400)
RBC: 4.97 MIL/uL (ref 3.87–5.11)
RDW: 14.1 % (ref 11.5–15.5)
WBC: 7.1 10*3/uL (ref 4.0–10.5)

## 2014-06-07 LAB — I-STAT TROPONIN, ED: Troponin i, poc: 0.04 ng/mL (ref 0.00–0.08)

## 2014-06-07 MED ORDER — LABETALOL HCL 5 MG/ML IV SOLN
10.0000 mg | Freq: Once | INTRAVENOUS | Status: AC
  Administered 2014-06-08: 10 mg via INTRAVENOUS

## 2014-06-07 MED ORDER — TRIAMTERENE-HCTZ 37.5-25 MG PO TABS
1.0000 | ORAL_TABLET | ORAL | Status: DC
  Filled 2014-06-07: qty 1

## 2014-06-07 MED ORDER — LISINOPRIL 20 MG PO TABS
20.0000 mg | ORAL_TABLET | Freq: Once | ORAL | Status: DC
  Filled 2014-06-07: qty 1

## 2014-06-07 MED ORDER — LABETALOL HCL 5 MG/ML IV SOLN
10.0000 mg | Freq: Once | INTRAVENOUS | Status: AC
  Administered 2014-06-07: 10 mg via INTRAVENOUS
  Filled 2014-06-07: qty 4

## 2014-06-07 MED ORDER — ASPIRIN 81 MG PO CHEW
324.0000 mg | CHEWABLE_TABLET | Freq: Once | ORAL | Status: AC
  Administered 2014-06-08: 324 mg via ORAL
  Filled 2014-06-07: qty 4

## 2014-06-07 NOTE — ED Notes (Signed)
Patient fell into a wall about a week ago and though maybe she was injured at time. She continues to have pain on her left side since this incident. She has shortness of breath but this is not new and was going on prior to this incident. She states it comes and goes but it is always a dull ache. She has not taken any pain medications for the pain. Patient has discoloration in her fingers, they are blue. This is not new and non related. Her primary is aware.

## 2014-06-07 NOTE — ED Provider Notes (Signed)
CSN: [id number]     Arrival date & time 06/07/14  1816 History   First MD Initiated Contact with Patient 06/07/14 2121     Chief Complaint  Patient presents with  . Rib Injury     (Consider location/radiation/quality/duration/timing/severity/associated sxs/prior Treatment) HPI [name] [name] is a 80 y.o. female  With hx of CHF, Mitral valve disease, Afib,  presents to D with complaint of chest pain. Patient states she did fall a week ago and hit left side on the corner of the wall. She states pain got better since then. She states she did not have pain for last several days, however today she was at home and started having severe pain in the left side. She states pain radiated around her ribs. She reports associated shortness of breath and shakiness. Patient states "I felt like was going to have a heart attack." She called her friend who brought her to emergency department. By the time she was waiting for reading in the waiting room, she states pain resolved. She states she did not take any medications at home prior to coming in. She admits to increased and leg swelling over the last several weeks, she states her primary care doctor is aware, so is her cardiologist, states she is on Lasix. She states she has gained about 10 pounds in the last few weeks. She denies exertional shortness of breath. No exertional chest pain. No prior coronary disease. At this time no complaints.  Past Medical History  Diagnosis Date  . Mitral valve disease   . Mitral regurgitation   . Hypothyroidism   . B12 deficiency   . Heart murmur   . Allergy   . OA (osteoarthritis)   . Migraines   . Atrial fibrillation     pt declines anticoag (jehovah's witness)  . Cardiomegaly     on CT scan 03/2011   Past Surgical History  Procedure Laterality Date  . Tonsillectomy    . Nasal endoscopy with epistaxis control  07/15/2012    Procedure: NASAL ENDOSCOPY WITH EPISTAXIS CONTROL;  Surgeon: Izora Gala, MD;  Location: [facility]  [facility];  Service: ENT;  Laterality: N/A;   Family History  Problem Relation Age of Onset  . Stroke Mother   . Arthritis Father   . Hypertension Mother   . Stomach cancer Father    History  Substance Use Topics  . Smoking status: Former Research scientist (life sciences)  . Smokeless tobacco: Never Used  . Alcohol Use: Yes     Comment: Social   OB History    No data available     Review of Systems  Constitutional: Negative for fever and chills.  Respiratory: Positive for chest tightness and shortness of breath. Negative for cough.   Cardiovascular: Positive for chest pain. Negative for palpitations and leg swelling.  Gastrointestinal: Negative for nausea, vomiting, abdominal pain and diarrhea.  Genitourinary: Negative for dysuria and flank pain.  Musculoskeletal: Negative for myalgias, arthralgias, neck pain and neck stiffness.  Skin: Negative for rash.  Neurological: Positive for tremors. Negative for dizziness, weakness and headaches.  All other systems reviewed and are negative.     Allergies  Sulfa antibiotics; Amlodipine; Cozaar; Edecrin; Lasix; Ramipril; and Hctz  Home Medications   Prior to Admission medications   Medication Sig Start Date End Date Taking? Authorizing Provider  aspirin 81 MG tablet Take 81 mg by mouth daily.   Yes Historical Provider, MD  atenolol (TENORMIN) 25 MG tablet Take 1/2 tablet by mouth every day or as directed.  05/12/14  Yes Darlin Coco, MD  B Complex Vitamins (VITAMIN-B COMPLEX PO) Take by mouth.   Yes Historical Provider, MD  cholecalciferol (VITAMIN D) 1000 UNITS tablet Take 1,000 Units by mouth daily.   Yes Historical Provider, MD  digoxin (LANOXIN) 0.125 MG tablet TAKE ONE TABLET BY MOUTH ONE TIME DAILY  05/16/14  Yes Darlin Coco, MD  furosemide (LASIX) 40 MG tablet 1 tablet once a week Patient taking differently: Take 40 mg by mouth 2 (two) times a week. Every Monday and friday 05/12/14  Yes Darlin Coco, MD  latanoprost (XALATAN) 0.005 %  ophthalmic solution Place 1 drop into both eyes daily.  04/16/14  Yes Historical Provider, MD  levothyroxine (SYNTHROID, LEVOTHROID) 50 MCG tablet TAKE ONE TABLET BY MOUTH ONE TIME DAILY 07/09/11  Yes Rowe Clack, MD  lisinopril (PRINIVIL,ZESTRIL) 20 MG tablet TAKE ONE TABLET BY MOUTH TWICE DAILY 03/04/14  Yes Darlin Coco, MD  sodium chloride (OCEAN) 0.65 % SOLN nasal spray Place 1 spray into both nostrils at bedtime as needed for congestion.   Yes Historical Provider, MD  spironolactone (ALDACTONE) 25 MG tablet Take 25 mg by mouth 2 (two) times daily. 01/08/14  Yes Darlin Coco, MD  triamterene-hydrochlorothiazide (MAXZIDE-25) 37.5-25 MG per tablet Take 1 tablet by mouth as needed.  04/08/14  Yes Historical Provider, MD   BP 207/105 mmHg  Pulse 69  Temp(Src) 98.7 F (37.1 C) (Oral)  Resp 28  SpO2 97% Physical Exam  Constitutional: She is oriented to person, place, and time. She appears well-developed and well-nourished. No distress.  HENT:  Head: Normocephalic.  Eyes: Conjunctivae are normal.  Neck: Neck supple.  Cardiovascular: Normal rate, regular rhythm and normal heart sounds.   Pulmonary/Chest: Effort normal and breath sounds normal. No respiratory distress. She has no wheezes. She has no rales. She exhibits tenderness.  Slight left ribs tenderness over mid axillary line on the left side. No bruising, erythema, swelling.  Abdominal: Soft. Bowel sounds are normal. She exhibits no distension. There is no tenderness. There is no rebound.  Musculoskeletal: She exhibits edema.  3+ pitting edema of lower extremities bilaterally.  Neurological: She is alert and oriented to person, place, and time.  Skin: Skin is warm and dry.  Psychiatric: She has a normal mood and affect. Her behavior is normal.  Nursing note and vitals reviewed.   ED Course  Procedures (including critical care time) Labs Review Labs Reviewed  CBC WITH DIFFERENTIAL - Abnormal; Notable for the following:     Neutrophils Relative % 84 (*)    Lymphocytes Relative 9 (*)    Lymphs Abs 0.6 (*)    All other components within normal limits  BASIC METABOLIC PANEL  BRAIN NATRIURETIC PEPTIDE  I-STAT TROPOININ, ED    Imaging Review Dg Chest 2 View (if Patient Has Fever And/or Copd)  06/07/2014   CLINICAL DATA:  Left lower chest pain  EXAM: CHEST  2 VIEW  COMPARISON:  09/06/2013  FINDINGS: Cardiac shadow remains enlarged. The lungs are well aerated bilaterally. No focal infiltrate or sizable effusion is seen. No acute bony abnormality is seen.  IMPRESSION: Stable cardiomegaly.  No acute abnormality is noted.   Electronically Signed   By: Inez Catalina M.D.   On: 06/07/2014 18:47     EKG Interpretation None      MDM   Final diagnoses:  Chest pain, unspecified chest pain type  Hypertensive urgency  Hyponatremia   patient is here with left-sided chest pain, although she states she  having pain from hitting it on the wall, it is unlikely given her pain went away, and returned today. She does have some tenderness to palpation, however she states pain feels more inside. Will get EKG, labs, troponin. Patient's blood pressure is high, 568 systolic. She states she did not take her evening medications today.   12:35 AM EKG is unchanged. Troponin negative. BNP elevated, chest x-ray showing cardiomegaly otherwise normal. BMP not back, called lab, told me that it is running right now. Worried about hypertensive emergency given his elevated blood pressure with chest pain. Patient received 2 doses of labetalol at this time. Blood pressure is down to 616 systolic. Patient continues to be symptomatic. Aspirin given. Spoke with side hospital as they will admit patient.  Filed Vitals:   06/07/14 2240 06/07/14 2300 06/08/14 0000 06/08/14 0020  BP: 218/110 207/105 192/113 177/90  Pulse: 80 69 71 71  Temp:      TempSrc:      Resp: 20 28 19 22   SpO2: 98% 97% 98% 100%       Renold Genta,  PA-C 06/09/14 2346  Debby Freiberg, MD 06/14/14 1313

## 2014-06-08 ENCOUNTER — Encounter (HOSPITAL_COMMUNITY): Payer: Self-pay | Admitting: *Deleted

## 2014-06-08 DIAGNOSIS — Z882 Allergy status to sulfonamides status: Secondary | ICD-10-CM | POA: Diagnosis not present

## 2014-06-08 DIAGNOSIS — I10 Essential (primary) hypertension: Secondary | ICD-10-CM

## 2014-06-08 DIAGNOSIS — G43909 Migraine, unspecified, not intractable, without status migrainosus: Secondary | ICD-10-CM | POA: Diagnosis present

## 2014-06-08 DIAGNOSIS — I272 Other secondary pulmonary hypertension: Secondary | ICD-10-CM | POA: Diagnosis not present

## 2014-06-08 DIAGNOSIS — I129 Hypertensive chronic kidney disease with stage 1 through stage 4 chronic kidney disease, or unspecified chronic kidney disease: Secondary | ICD-10-CM | POA: Diagnosis present

## 2014-06-08 DIAGNOSIS — E871 Hypo-osmolality and hyponatremia: Secondary | ICD-10-CM | POA: Diagnosis not present

## 2014-06-08 DIAGNOSIS — I160 Hypertensive urgency: Secondary | ICD-10-CM | POA: Diagnosis present

## 2014-06-08 DIAGNOSIS — Z7982 Long term (current) use of aspirin: Secondary | ICD-10-CM | POA: Diagnosis not present

## 2014-06-08 DIAGNOSIS — Z888 Allergy status to other drugs, medicaments and biological substances status: Secondary | ICD-10-CM | POA: Diagnosis not present

## 2014-06-08 DIAGNOSIS — I5033 Acute on chronic diastolic (congestive) heart failure: Secondary | ICD-10-CM | POA: Diagnosis present

## 2014-06-08 DIAGNOSIS — R079 Chest pain, unspecified: Secondary | ICD-10-CM | POA: Diagnosis present

## 2014-06-08 DIAGNOSIS — I482 Chronic atrial fibrillation: Secondary | ICD-10-CM | POA: Diagnosis not present

## 2014-06-08 DIAGNOSIS — Z87891 Personal history of nicotine dependence: Secondary | ICD-10-CM | POA: Diagnosis not present

## 2014-06-08 DIAGNOSIS — E039 Hypothyroidism, unspecified: Secondary | ICD-10-CM | POA: Diagnosis present

## 2014-06-08 DIAGNOSIS — N183 Chronic kidney disease, stage 3 (moderate): Secondary | ICD-10-CM | POA: Diagnosis present

## 2014-06-08 DIAGNOSIS — I083 Combined rheumatic disorders of mitral, aortic and tricuspid valves: Secondary | ICD-10-CM | POA: Diagnosis present

## 2014-06-08 DIAGNOSIS — I447 Left bundle-branch block, unspecified: Secondary | ICD-10-CM | POA: Diagnosis present

## 2014-06-08 LAB — BASIC METABOLIC PANEL
Anion gap: 9 (ref 5–15)
Anion gap: 9 (ref 5–15)
BUN: 36 mg/dL — AB (ref 6–23)
BUN: 36 mg/dL — AB (ref 6–23)
CALCIUM: 9.5 mg/dL (ref 8.4–10.5)
CALCIUM: 9.4 mg/dL (ref 8.4–10.5)
CO2: 27 mmol/L (ref 19–32)
CO2: 26 mmol/L (ref 19–32)
CREATININE: 1.27 mg/dL — AB (ref 0.50–1.10)
Chloride: 93 mEq/L — ABNORMAL LOW (ref 96–112)
Chloride: 92 mEq/L — ABNORMAL LOW (ref 96–112)
Creatinine, Ser: 1.21 mg/dL — ABNORMAL HIGH (ref 0.50–1.10)
GFR calc Af Amer: 47 mL/min — ABNORMAL LOW (ref >90)
GFR calc non Af Amer: 38 mL/min — ABNORMAL LOW (ref >90)
GFR calc non Af Amer: 41 mL/min — ABNORMAL LOW (ref >90)
GFR, EST AFRICAN AMERICAN: 44 mL/min — AB (ref >90)
GLUCOSE: 133 mg/dL — AB (ref 70–99)
Glucose, Bld: 121 mg/dL — ABNORMAL HIGH (ref 70–99)
Potassium: 4.7 mmol/L (ref 3.5–5.1)
Potassium: 4.5 mmol/L (ref 3.5–5.1)
Sodium: 129 mmol/L — ABNORMAL LOW (ref 135–145)
Sodium: 127 mmol/L — ABNORMAL LOW (ref 135–145)

## 2014-06-08 LAB — TROPONIN I
TROPONIN I: 0.06 ng/mL — AB (ref <0.031)
Troponin I: 0.06 ng/mL — ABNORMAL HIGH (ref <0.031)
Troponin I: 0.07 ng/mL — ABNORMAL HIGH (ref <0.031)

## 2014-06-08 LAB — CBC
HCT: 45 % (ref 36.0–46.0)
HEMOGLOBIN: 15.2 g/dL — AB (ref 12.0–15.0)
MCH: 30.3 pg (ref 26.0–34.0)
MCHC: 33.8 g/dL (ref 30.0–36.0)
MCV: 89.8 fL (ref 78.0–100.0)
Platelets: 230 10*3/uL (ref 150–400)
RBC: 5.01 MIL/uL (ref 3.87–5.11)
RDW: 14.1 % (ref 11.5–15.5)
WBC: 7.6 10*3/uL (ref 4.0–10.5)

## 2014-06-08 LAB — PROTIME-INR
INR: 1.19 (ref 0.00–1.49)
PROTHROMBIN TIME: 15.2 s (ref 11.6–15.2)

## 2014-06-08 LAB — BRAIN NATRIURETIC PEPTIDE: B NATRIURETIC PEPTIDE 5: 1059.1 pg/mL — AB (ref 0.0–100.0)

## 2014-06-08 LAB — APTT: APTT: 37 s (ref 24–37)

## 2014-06-08 MED ORDER — LISINOPRIL 20 MG PO TABS
20.0000 mg | ORAL_TABLET | Freq: Once | ORAL | Status: AC
  Administered 2014-06-08: 20 mg via ORAL
  Filled 2014-06-08: qty 1

## 2014-06-08 MED ORDER — SODIUM CHLORIDE 0.9 % IJ SOLN
3.0000 mL | Freq: Two times a day (BID) | INTRAMUSCULAR | Status: DC
  Administered 2014-06-08 – 2014-06-14 (×6): 3 mL via INTRAVENOUS

## 2014-06-08 MED ORDER — FUROSEMIDE 10 MG/ML IJ SOLN: 40.0000 mg | Freq: Every day | INTRAMUSCULAR | Status: DC

## 2014-06-08 MED ORDER — LABETALOL HCL 5 MG/ML IV SOLN
10.0000 mg | INTRAVENOUS | Status: DC | PRN
  Administered 2014-06-08: 10 mg via INTRAVENOUS
  Filled 2014-06-08 (×3): qty 4

## 2014-06-08 MED ORDER — SODIUM CHLORIDE 0.9 % IJ SOLN
3.0000 mL | Freq: Two times a day (BID) | INTRAMUSCULAR | Status: DC
  Administered 2014-06-09 – 2014-06-13 (×8): 3 mL via INTRAVENOUS

## 2014-06-08 MED ORDER — LEVOTHYROXINE SODIUM 50 MCG PO TABS
50.0000 ug | ORAL_TABLET | Freq: Every day | ORAL | Status: DC
  Administered 2014-06-08 – 2014-06-14 (×7): 50 ug via ORAL
  Filled 2014-06-08 (×9): qty 1

## 2014-06-08 MED ORDER — FUROSEMIDE 10 MG/ML IJ SOLN
40.0000 mg | Freq: Once | INTRAMUSCULAR | Status: AC
  Administered 2014-06-08: 40 mg via INTRAVENOUS
  Filled 2014-06-08: qty 4

## 2014-06-08 MED ORDER — HEPARIN SODIUM (PORCINE) 5000 UNIT/ML IJ SOLN
5000.0000 [IU] | Freq: Three times a day (TID) | INTRAMUSCULAR | Status: DC
  Administered 2014-06-08 – 2014-06-14 (×20): 5000 [IU] via SUBCUTANEOUS
  Filled 2014-06-08 (×22): qty 1

## 2014-06-08 MED ORDER — HEPARIN (PORCINE) IN NACL 100-0.45 UNIT/ML-% IJ SOLN
650.0000 [IU]/h | INTRAMUSCULAR | Status: DC
  Administered 2014-06-08: 650 [IU]/h via INTRAVENOUS
  Filled 2014-06-08: qty 250

## 2014-06-08 MED ORDER — SODIUM CHLORIDE 0.9 % IJ SOLN
3.0000 mL | INTRAMUSCULAR | Status: DC | PRN
  Administered 2014-06-08: 3 mL via INTRAVENOUS
  Filled 2014-06-08: qty 3

## 2014-06-08 MED ORDER — METOPROLOL SUCCINATE ER 25 MG PO TB24
25.0000 mg | ORAL_TABLET | Freq: Every day | ORAL | Status: DC
  Administered 2014-06-08: 25 mg via ORAL
  Filled 2014-06-08: qty 1

## 2014-06-08 MED ORDER — BUMETANIDE 0.25 MG/ML IJ SOLN
1.0000 mg | Freq: Every day | INTRAMUSCULAR | Status: DC
  Filled 2014-06-08: qty 4

## 2014-06-08 MED ORDER — HEPARIN SODIUM (PORCINE) 5000 UNIT/ML IJ SOLN
5000.0000 [IU] | Freq: Three times a day (TID) | INTRAMUSCULAR | Status: DC
  Filled 2014-06-08 (×4): qty 1

## 2014-06-08 MED ORDER — DIGOXIN 125 MCG PO TABS
125.0000 ug | ORAL_TABLET | Freq: Every day | ORAL | Status: DC
  Filled 2014-06-08: qty 1

## 2014-06-08 MED ORDER — LATANOPROST 0.005 % OP SOLN
1.0000 [drp] | Freq: Every day | OPHTHALMIC | Status: DC
  Administered 2014-06-08 – 2014-06-14 (×7): 1 [drp] via OPHTHALMIC
  Filled 2014-06-08: qty 2.5

## 2014-06-08 MED ORDER — BUMETANIDE 0.25 MG/ML IJ SOLN
0.5000 mg | Freq: Every day | INTRAMUSCULAR | Status: DC
  Administered 2014-06-08 – 2014-06-12 (×5): 0.5 mg via INTRAVENOUS
  Filled 2014-06-08 (×6): qty 2

## 2014-06-08 MED ORDER — ATENOLOL 12.5 MG HALF TABLET
12.5000 mg | ORAL_TABLET | Freq: Every day | ORAL | Status: DC
  Filled 2014-06-08: qty 1

## 2014-06-08 MED ORDER — HEPARIN BOLUS VIA INFUSION
3100.0000 [IU] | Freq: Once | INTRAVENOUS | Status: AC
  Administered 2014-06-08: 3100 [IU] via INTRAVENOUS
  Filled 2014-06-08: qty 3100

## 2014-06-08 MED ORDER — ATENOLOL 25 MG PO TABS
25.0000 mg | ORAL_TABLET | Freq: Every day | ORAL | Status: DC
  Filled 2014-06-08: qty 1

## 2014-06-08 MED ORDER — SPIRONOLACTONE 25 MG PO TABS
25.0000 mg | ORAL_TABLET | Freq: Two times a day (BID) | ORAL | Status: DC
  Administered 2014-06-08 – 2014-06-11 (×8): 25 mg via ORAL
  Filled 2014-06-08 (×11): qty 1

## 2014-06-08 MED ORDER — SODIUM CHLORIDE 0.9 % IV SOLN
250.0000 mL | INTRAVENOUS | Status: DC | PRN
  Administered 2014-06-08: 250 mL via INTRAVENOUS

## 2014-06-08 MED ORDER — ASPIRIN 81 MG PO CHEW
81.0000 mg | CHEWABLE_TABLET | Freq: Every day | ORAL | Status: DC
  Administered 2014-06-08 – 2014-06-14 (×7): 81 mg via ORAL
  Filled 2014-06-08 (×7): qty 1

## 2014-06-08 NOTE — Progress Notes (Signed)
[facility] hospitalist progress note. Chief complaint. Elevated troponin. History of present illness. This 80 year old female presented and was admitted with complaints of chest pain. He was diagnosed with acute on chronic congestive heart failure and hypertensive urgency. Serial troponins ordered to follow for chest pain workup and the first troponin has resulted elevated 0.06. The see the patient at bedside and repeat EKG was obtained. The EKG shows ST elevation in V1-V4 and depression in V6. This saw her does not look significantly changed from EKG obtained earlier in the emergency room. Patient indicates she is chest pain-free currently. Physical exam. Vital signs. Temperature 97.8, pulse 71, respiration 20, blood pressure 140/83. O2 sats 96%. General appearance. Frail elderly female who is alert and in no distress. Cardiac. Irregularly irregular with 2/6 systolic ejection murmur. She does have peripheral edema. Lungs. He scattered crackles otherwise clear without distress or cough. Stable O2 sats. Abdomen. Soft with positive bowel sounds. No pain. Impression/plan. Problem #1. Chest pain with elevated troponin. I discussed the case with cardiology on-call doctor [place]. They will see in consult later this a.m. In the interim we agree to initiate heparin therapy. Further troponins will be pending.

## 2014-06-08 NOTE — Consult Note (Addendum)
Primary cardiologist: Dr Patty Sermons  HPI: 80 year old female with past medical history of permanent atrial fibrillation for evaluation of chest pain and elevated troponin. Nuclear study September 2009 showed an ejection fraction of 85% and normal perfusion. Last echocardiogram July 2014 showed normal LV function, severe biatrial enlargement, moderate tricuspid regurgitation with severely elevated pulmonary pressures, mild aortic and mitral regurgitation. Patient has chronic dyspnea on exertion and mild orthopnea as well as pedal edema. 2 weeks ago she fell and hit her left rib area. She has had intermittent pain in that area since. It is described as an ache. She had worse pain last evening for 2 hours that resolved spontaneously. The pain increased with inspiration but not movements. She came to the hospital for further evaluation. Troponin minimally elevated and cardiology asked to evaluate. She also notes weight gain of approximately 10 pounds and increased lower extremity edema.  Medications Prior to Admission  Medication Sig Dispense Refill  . aspirin 81 MG tablet Take 81 mg by mouth daily.    Marland Kitchen atenolol (TENORMIN) 25 MG tablet Take 1/2 tablet by mouth every day or as directed. 90 tablet 3  . B Complex Vitamins (VITAMIN-B COMPLEX PO) Take by mouth.    . cholecalciferol (VITAMIN D) 1000 UNITS tablet Take 1,000 Units by mouth daily.    . digoxin (LANOXIN) 0.125 MG tablet TAKE ONE TABLET BY MOUTH ONE TIME DAILY  30 tablet 3  . furosemide (LASIX) 40 MG tablet 1 tablet once a week (Patient taking differently: Take 40 mg by mouth 2 (two) times a week. Every Monday and friday) 12 tablet 3  . latanoprost (XALATAN) 0.005 % ophthalmic solution Place 1 drop into both eyes daily.     Marland Kitchen levothyroxine (SYNTHROID, LEVOTHROID) 50 MCG tablet TAKE ONE TABLET BY MOUTH ONE TIME DAILY 30 tablet 5  . lisinopril (PRINIVIL,ZESTRIL) 20 MG tablet TAKE ONE TABLET BY MOUTH TWICE DAILY 180 tablet 0  . sodium chloride  (OCEAN) 0.65 % SOLN nasal spray Place 1 spray into both nostrils at bedtime as needed for congestion.    Marland Kitchen spironolactone (ALDACTONE) 25 MG tablet Take 25 mg by mouth 2 (two) times daily.    Marland Kitchen triamterene-hydrochlorothiazide (MAXZIDE-25) 37.5-25 MG per tablet Take 1 tablet by mouth as needed.       Allergies  Allergen Reactions  . Sulfa Antibiotics   . Amlodipine     Pain in jaw, and peripheral edema  . Cozaar     insomnia  . Edecrin [Ethacrynic Acid] Nausea And Vomiting  . Lasix [Furosemide] Itching    Takes furosemide at home  . Ramipril     Dyspnea - Tolerates lisinopril  . Hctz [Hydrochlorothiazide] Rash    Takes Triamterene/HCTZ at home.    Past Medical History  Diagnosis Date  . Mitral valve disease   . Mitral regurgitation   . Hypothyroidism   . B12 deficiency   . Heart murmur   . Allergy   . OA (osteoarthritis)   . Migraines   . Atrial fibrillation     pt declines anticoag (jehovah's witness)  . Cardiomegaly     on CT scan 03/2011    Past Surgical History  Procedure Laterality Date  . Tonsillectomy    . Nasal endoscopy with epistaxis control  07/15/2012    Procedure: NASAL ENDOSCOPY WITH EPISTAXIS CONTROL;  Surgeon: Serena Colonel, MD;  Location: [facility];  Service: ENT;  Laterality: N/A;    History   Social History  . Marital Status: Single  Spouse Name: N/A    Number of Children: N/A  . Years of Education: N/A   Occupational History  . Retired     Pharmacist, hospital   Social History Main Topics  . Smoking status: Former Research scientist (life sciences)  . Smokeless tobacco: Never Used  . Alcohol Use: Yes     Comment: Social  . Drug Use: No  . Sexual Activity: Not on file   Other Topics Concern  . Not on file   Social History Narrative   Patient is Jehovah Witness, no blood products allowed.    Family History  Problem Relation Age of Onset  . Stroke Mother   . Arthritis Father   . Hypertension Mother   . Stomach cancer Father     ROS:  no fevers or chills, productive  cough, hemoptysis, dysphasia, odynophagia, melena, hematochezia, dysuria, hematuria, rash, seizure activity, claudication. Remaining systems are negative.  Physical Exam:   Blood pressure 146/83, pulse 71, temperature 97.8 F (36.6 C), temperature source Oral, resp. rate 20, height 5' (1.524 m), weight 113 lb 12.1 oz (51.6 kg), SpO2 96 %.  General:  Well developed/frail in NAD Skin warm/dry Patient not depressed No peripheral clubbing Back-normal HEENT-normal/normal eyelids Neck supple/normal carotid upstroke bilaterally; no bruits; no JVD; no thyromegaly chest - CTA/ normal expansion CV - irregular/normal S1 and S2; no rubs or gallops;  PMI nondisplaced, 2/6 systolic murmur apex Abdomen -NT/ND, no HSM, no mass, + bowel sounds, no bruit 2+ femoral pulses, no bruits Ext-1+ edema, no chords, diminished distal pulses Neuro-grossly nonfocal  ECG atrial fibrillation, left bundle branch block  Results for orders placed or performed during the hospital encounter of 06/07/14 (from the past 48 hour(s))  CBC with Differential     Status: Abnormal   Collection Time: 06/07/14 10:07 PM  Result Value Ref Range   WBC 7.1 4.0 - 10.5 K/uL   RBC 4.97 3.87 - 5.11 MIL/uL   Hemoglobin 14.8 12.0 - 15.0 g/dL   HCT 45.1 36.0 - 46.0 %   MCV 90.7 78.0 - 100.0 fL   MCH 29.8 26.0 - 34.0 pg   MCHC 32.8 30.0 - 36.0 g/dL   RDW 14.1 11.5 - 15.5 %   Platelets 268 150 - 400 K/uL   Neutrophils Relative % 84 (H) 43 - 77 %   Neutro Abs 6.0 1.7 - 7.7 K/uL   Lymphocytes Relative 9 (L) 12 - 46 %   Lymphs Abs 0.6 (L) 0.7 - 4.0 K/uL   Monocytes Relative 7 3 - 12 %   Monocytes Absolute 0.5 0.1 - 1.0 K/uL   Eosinophils Relative 0 0 - 5 %   Eosinophils Absolute 0.0 0.0 - 0.7 K/uL   Basophils Relative 0 0 - 1 %   Basophils Absolute 0.0 0.0 - 0.1 K/uL  Basic metabolic panel     Status: Abnormal   Collection Time: 06/07/14 10:07 PM  Result Value Ref Range   Sodium 129 (L) 135 - 145 mmol/L    Comment: Please note  change in reference range.   Potassium 4.7 3.5 - 5.1 mmol/L    Comment: Please note change in reference range. DELTA CHECK NOTED    Chloride 93 (L) 96 - 112 mEq/L   CO2 27 19 - 32 mmol/L   Glucose, Bld 121 (H) 70 - 99 mg/dL   BUN 36 (H) 6 - 23 mg/dL   Creatinine, Ser 1.27 (H) 0.50 - 1.10 mg/dL   Calcium 9.5 8.4 - 10.5 mg/dL   GFR calc  non Af Amer 38 (L) >90 mL/min   GFR calc Af Amer 44 (L) >90 mL/min    Comment: (NOTE) The eGFR has been calculated using the CKD EPI equation. This calculation has not been validated in all clinical situations. eGFR's persistently <90 mL/min signify possible Chronic Kidney Disease.    Anion gap 9 5 - 15  I-Stat Troponin, ED (not at [facility])     Status: None   Collection Time: 06/07/14 10:23 PM  Result Value Ref Range   Troponin i, poc 0.04 0.00 - 0.08 ng/mL   Comment 3            Comment: Due to the release kinetics of cTnI, a negative result within the first hours of the onset of symptoms does not rule out myocardial infarction with certainty. If myocardial infarction is still suspected, repeat the test at appropriate intervals.   Brain natriuretic peptide     Status: Abnormal   Collection Time: 06/07/14 11:23 PM  Result Value Ref Range   B Natriuretic Peptide 1059.1 (H) 0.0 - 100.0 pg/mL    Comment: Please note change in reference range.  Troponin I (q 6hr x 3)     Status: Abnormal   Collection Time: 06/08/14 12:45 AM  Result Value Ref Range   Troponin I 0.06 (H) <0.031 ng/mL    Comment:        PERSISTENTLY INCREASED TROPONIN VALUES IN THE RANGE OF 0.04-0.49 ng/mL CAN BE SEEN IN:       -UNSTABLE ANGINA       -CONGESTIVE HEART FAILURE       -MYOCARDITIS       -CHEST TRAUMA       -ARRYHTHMIAS       -LATE PRESENTING MYOCARDIAL INFARCTION       -COPD   CLINICAL FOLLOW-UP RECOMMENDED. Please note change in reference range.   CBC     Status: Abnormal   Collection Time: 06/08/14  1:44 AM  Result Value Ref Range   WBC 7.6 4.0 - 10.5 K/uL     RBC 5.01 3.87 - 5.11 MIL/uL   Hemoglobin 15.2 (H) 12.0 - 15.0 g/dL   HCT 45.0 36.0 - 46.0 %   MCV 89.8 78.0 - 100.0 fL   MCH 30.3 26.0 - 34.0 pg   MCHC 33.8 30.0 - 36.0 g/dL   RDW 14.1 11.5 - 15.5 %   Platelets 230 150 - 400 K/uL  Basic metabolic panel     Status: Abnormal   Collection Time: 06/08/14  1:44 AM  Result Value Ref Range   Sodium 127 (L) 135 - 145 mmol/L    Comment: Please note change in reference range.   Potassium 4.5 3.5 - 5.1 mmol/L    Comment: Please note change in reference range.   Chloride 92 (L) 96 - 112 mEq/L   CO2 26 19 - 32 mmol/L   Glucose, Bld 133 (H) 70 - 99 mg/dL   BUN 36 (H) 6 - 23 mg/dL   Creatinine, Ser 1.21 (H) 0.50 - 1.10 mg/dL   Calcium 9.4 8.4 - 10.5 mg/dL   GFR calc non Af Amer 41 (L) >90 mL/min   GFR calc Af Amer 47 (L) >90 mL/min    Comment: (NOTE) The eGFR has been calculated using the CKD EPI equation. This calculation has not been validated in all clinical situations. eGFR's persistently <90 mL/min signify possible Chronic Kidney Disease.    Anion gap 9 5 - 15  Troponin I (q 6hr x  3)     Status: Abnormal   Collection Time: 06/08/14  5:30 AM  Result Value Ref Range   Troponin I 0.06 (H) <0.031 ng/mL    Comment:        PERSISTENTLY INCREASED TROPONIN VALUES IN THE RANGE OF 0.04-0.49 ng/mL CAN BE SEEN IN:       -UNSTABLE ANGINA       -CONGESTIVE HEART FAILURE       -MYOCARDITIS       -CHEST TRAUMA       -ARRYHTHMIAS       -LATE PRESENTING MYOCARDIAL INFARCTION       -COPD   CLINICAL FOLLOW-UP RECOMMENDED. Please note change in reference range.   APTT     Status: None   Collection Time: 06/08/14  5:30 AM  Result Value Ref Range   aPTT 37 24 - 37 seconds    Comment:        IF BASELINE aPTT IS ELEVATED, SUGGEST PATIENT RISK ASSESSMENT BE USED TO DETERMINE APPROPRIATE ANTICOAGULANT THERAPY.   Protime-INR     Status: None   Collection Time: 06/08/14  5:30 AM  Result Value Ref Range   Prothrombin Time 15.2 11.6 - 15.2  seconds   INR 1.19 0.00 - 1.49    Dg Chest 2 View (if Patient Has Fever And/or Copd)  06/07/2014   CLINICAL DATA:  Left lower chest pain  EXAM: CHEST  2 VIEW  COMPARISON:  09/06/2013  FINDINGS: Cardiac shadow remains enlarged. The lungs are well aerated bilaterally. No focal infiltrate or sizable effusion is seen. No acute bony abnormality is seen.  IMPRESSION: Stable cardiomegaly.  No acute abnormality is noted.   Electronically Signed   By: Inez Catalina M.D.   On: 06/07/2014 18:47    Assessment/Plan 1 chest pain-symptoms occurred after falling and hitting left chest area. They are very atypical. Troponin is 0.06 and there is no clear trend up. I do not think this represents an acute coronary syndrome. Patient's electrocardiogram shows left bundle branch block which is unchanged. I do not think further ischemia evaluation is indicated. 2 permanent atrial fibrillation-patient's heart rate is controlled. Given renal insufficiency I would favor discontinuing digoxin. I will also change atenolol to Toprol. Follow heart rate and increase as needed. Continue aspirin. 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 She understands the higher risk of stroke with aspirin compared to Coumadin. 3 hypertension-blood pressure is elevated. Continue preadmission medications and follow. Hopefully blood pressure will improve with diuresis. 4 acute on chronic diastolic congestive heart failure-patient is volume overloaded. We'll treat with bumex 1 mg IV daily (lasix allergy) and follow renal function closely. If she has a reaction to bumex could try ethacrynic acid. 5 pulmonary hypertension-patient most likely has some edema related to pulmonary hypertension and RV dysfunction. 6 hyponatremia-would fluid restrict and follow.  Kirk Ruths MD 06/08/2014, 7:27 AM

## 2014-06-08 NOTE — Progress Notes (Addendum)
ANTICOAGULATION CONSULT NOTE - Initial Consult  Pharmacy Consult for Heparin Indication: chest pain/ACS  Allergies  Allergen Reactions  . Sulfa Antibiotics   . Amlodipine     Pain in jaw, and peripheral edema  . Cozaar     insomnia  . Edecrin [Ethacrynic Acid] Nausea And Vomiting  . Lasix [Furosemide] Itching    Takes furosemide at home  . Ramipril     Dyspnea - Tolerates lisinopril  . Hctz [Hydrochlorothiazide] Rash    Takes Triamterene/HCTZ at home.    Patient Measurements: Height: 5' (152.4 cm) Weight: 113 lb 12.1 oz (51.6 kg) IBW/kg (Calculated) : 45.5 Heparin Dosing Weight: 52 kg  Vital Signs: Temp: 97.8 F (36.6 C) (12/27 0335) Temp Source: Oral (12/27 0335) BP: 146/83 mmHg (12/27 0335) Pulse Rate: 71 (12/27 0335)  Labs:  Recent Labs  06/07/14 2207 06/08/14 0045 06/08/14 0144  HGB 14.8  --  15.2*  HCT 45.1  --  45.0  PLT 268  --  230  CREATININE 1.27*  --  1.21*  TROPONINI  --  0.06*  --     Estimated Creatinine Clearance: 25.7 mL/min (by C-G formula based on Cr of 1.21).   Medical History: Past Medical History  Diagnosis Date  . Mitral valve disease   . Mitral regurgitation   . Hypothyroidism   . B12 deficiency   . Heart murmur   . Allergy   . OA (osteoarthritis)   . Migraines   . Atrial fibrillation     pt declines anticoag (jehovah's witness)  . Cardiomegaly     on CT scan 03/2011    Medications:  Prescriptions prior to admission  Medication Sig Dispense Refill Last Dose  . aspirin 81 MG tablet Take 81 mg by mouth daily.   06/07/2014 at Unknown time  . atenolol (TENORMIN) 25 MG tablet Take 1/2 tablet by mouth every day or as directed. 90 tablet 3 06/07/2014 at 0900  . B Complex Vitamins (VITAMIN-B COMPLEX PO) Take by mouth.   06/07/2014 at Unknown time  . cholecalciferol (VITAMIN D) 1000 UNITS tablet Take 1,000 Units by mouth daily.   06/07/2014 at Unknown time  . digoxin (LANOXIN) 0.125 MG tablet TAKE ONE TABLET BY MOUTH ONE TIME  DAILY  30 tablet 3 06/07/2014 at Unknown time  . furosemide (LASIX) 40 MG tablet 1 tablet once a week (Patient taking differently: Take 40 mg by mouth 2 (two) times a week. Every Monday and friday) 12 tablet 3 06/06/2014 at Unknown time  . latanoprost (XALATAN) 0.005 % ophthalmic solution Place 1 drop into both eyes daily.    Past Month at Unknown time  . levothyroxine (SYNTHROID, LEVOTHROID) 50 MCG tablet TAKE ONE TABLET BY MOUTH ONE TIME DAILY 30 tablet 5 06/07/2014 at Unknown time  . lisinopril (PRINIVIL,ZESTRIL) 20 MG tablet TAKE ONE TABLET BY MOUTH TWICE DAILY 180 tablet 0 06/07/2014 at Unknown time  . sodium chloride (OCEAN) 0.65 % SOLN nasal spray Place 1 spray into both nostrils at bedtime as needed for congestion.   06/06/2014 at Unknown time  . spironolactone (ALDACTONE) 25 MG tablet Take 25 mg by mouth 2 (two) times daily.   06/07/2014 at Unknown time  . triamterene-hydrochlorothiazide (MAXZIDE-25) 37.5-25 MG per tablet Take 1 tablet by mouth as needed.    06/05/2014    Assessment: 80yo F presented with chest/rib pain, SOB and shakiness. Troponins elevated and repeat EKG shows ST changes. Pharmacy is asked to start heparin for ACS/STEMI. CBC is ok. SCr is elevated.  Goal of Therapy:  Heparin level 0.3-0.7 units/ml Monitor platelets by anticoagulation protocol: Yes   Plan:  Draw baseline aPTT and PT/INR. Give heparin 3100units IV bolus then start infusion at 650units/hr. Check heparin level in 8hrs and daily thereafter. Check CBC q24h while on heparin. F/u daily.  Romeo Rabon, PharmD, pager [phone]. 06/08/2014,4:41 AM.

## 2014-06-08 NOTE — H&P (Signed)
[facility] History and Physical  [name] [name] JXB:[id number] DOB: May 06, 1930 DOA: 06/07/2014  Referring physician: EDP PCP: Lilian Coma, MD   Chief Complaint: Chest pain   HPI: [name] [name] is a 80 y.o. female who presents to the ED with c/o chest pain.  Patient had a fall a week ago, hit left side on wall.  Pain had gotten better; however, today she developed severe, left sided chest pain.  Patient has also had 10 lb weight gain over the last month, nearly all of this is in the form of peripheral edema in her legs which are now very swollen.  Review of Systems: Systems reviewed.  As above, otherwise negative  Past Medical History  Diagnosis Date  . Mitral valve disease   . Mitral regurgitation   . Hypothyroidism   . B12 deficiency   . Heart murmur   . Allergy   . OA (osteoarthritis)   . Migraines   . Atrial fibrillation     pt declines anticoag (jehovah's witness)  . Cardiomegaly     on CT scan 03/2011   Past Surgical History  Procedure Laterality Date  . Tonsillectomy    . Nasal endoscopy with epistaxis control  07/15/2012    Procedure: NASAL ENDOSCOPY WITH EPISTAXIS CONTROL;  Surgeon: Izora Gala, MD;  Location: [facility];  Service: ENT;  Laterality: N/A;   Social History:  reports that she has quit smoking. She has never used smokeless tobacco. She reports that she drinks alcohol. She reports that she does not use illicit drugs.  Allergies  Allergen Reactions  . Sulfa Antibiotics   . Amlodipine     Pain in jaw, and peripheral edema  . Cozaar     insomnia  . Edecrin [Ethacrynic Acid] Nausea And Vomiting  . Lasix [Furosemide] Itching  . Ramipril     Dyspnea - Tolerates lisinopril  . Hctz [Hydrochlorothiazide] Rash    Takes Triamterene/HCTZ at home.    Family History  Problem Relation Age of Onset  . Stroke Mother   . Arthritis Father   . Hypertension Mother   . Stomach cancer Father      Prior to Admission medications   Medication Sig  Start Date End Date Taking? Authorizing Provider  aspirin 81 MG tablet Take 81 mg by mouth daily.   Yes Historical Provider, MD  atenolol (TENORMIN) 25 MG tablet Take 1/2 tablet by mouth every day or as directed. 05/12/14  Yes Darlin Coco, MD  B Complex Vitamins (VITAMIN-B COMPLEX PO) Take by mouth.   Yes Historical Provider, MD  cholecalciferol (VITAMIN D) 1000 UNITS tablet Take 1,000 Units by mouth daily.   Yes Historical Provider, MD  digoxin (LANOXIN) 0.125 MG tablet TAKE ONE TABLET BY MOUTH ONE TIME DAILY  05/16/14  Yes Darlin Coco, MD  furosemide (LASIX) 40 MG tablet 1 tablet once a week Patient taking differently: Take 40 mg by mouth 2 (two) times a week. Every Monday and friday 05/12/14  Yes Darlin Coco, MD  latanoprost (XALATAN) 0.005 % ophthalmic solution Place 1 drop into both eyes daily.  04/16/14  Yes Historical Provider, MD  levothyroxine (SYNTHROID, LEVOTHROID) 50 MCG tablet TAKE ONE TABLET BY MOUTH ONE TIME DAILY 07/09/11  Yes Rowe Clack, MD  lisinopril (PRINIVIL,ZESTRIL) 20 MG tablet TAKE ONE TABLET BY MOUTH TWICE DAILY 03/04/14  Yes Darlin Coco, MD  sodium chloride (OCEAN) 0.65 % SOLN nasal spray Place 1 spray into both nostrils at bedtime as needed for congestion.   Yes Historical Provider,  MD  spironolactone (ALDACTONE) 25 MG tablet Take 25 mg by mouth 2 (two) times daily. 01/08/14  Yes Darlin Coco, MD  triamterene-hydrochlorothiazide (MAXZIDE-25) 37.5-25 MG per tablet Take 1 tablet by mouth as needed.  04/08/14  Yes Historical Provider, MD   Physical Exam: Filed Vitals:   06/08/14 0020  BP: 177/90  Pulse: 71  Temp:   Resp: 22    BP 177/90 mmHg  Pulse 71  Temp(Src) 98.7 F (37.1 C) (Oral)  Resp 22  SpO2 100%  General Appearance:    Alert, oriented, no distress, appears stated age  Head:    Normocephalic, atraumatic  Eyes:    PERRL, EOMI, sclera non-icteric        Nose:   Nares without drainage or epistaxis. Mucosa, turbinates normal   Throat:   Moist mucous membranes. Oropharynx without erythema or exudate.  Neck:   Supple. No carotid bruits.  No thyromegaly.  No lymphadenopathy.   Back:     No CVA tenderness, no spinal tenderness  Lungs:     Clear to auscultation bilaterally, without wheezes, rhonchi or rales  Chest wall:    No tenderness to palpitation  Heart:    Regular rate and rhythm without murmurs, gallops, rubs  Abdomen:     Soft, non-tender, nondistended, normal bowel sounds, no organomegaly  Genitalia:    deferred  Rectal:    deferred  Extremities:   No clubbing, cyanosis or edema.  Pulses:   2+ and symmetric all extremities  Skin:   Skin color, texture, turgor normal, no rashes or lesions  Lymph nodes:   Cervical, supraclavicular, and axillary nodes normal  Neurologic:   CNII-XII intact. Normal strength, sensation and reflexes      throughout    Labs on Admission:  Basic Metabolic Panel: No results for input(s): NA, K, CL, CO2, GLUCOSE, BUN, CREATININE, CALCIUM, MG, PHOS in the last 168 hours. Liver Function Tests: No results for input(s): AST, ALT, ALKPHOS, BILITOT, PROT, ALBUMIN in the last 168 hours. No results for input(s): LIPASE, AMYLASE in the last 168 hours. No results for input(s): AMMONIA in the last 168 hours. CBC:  Recent Labs Lab 06/07/14 2207  WBC 7.1  NEUTROABS 6.0  HGB 14.8  HCT 45.1  MCV 90.7  PLT 268   Cardiac Enzymes: No results for input(s): CKTOTAL, CKMB, CKMBINDEX, TROPONINI in the last 168 hours.  BNP (last 3 results) No results for input(s): PROBNP in the last 8760 hours. CBG: No results for input(s): GLUCAP in the last 168 hours.  Radiological Exams on Admission: Dg Chest 2 View (if Patient Has Fever And/or Copd)  06/07/2014   CLINICAL DATA:  Left lower chest pain  EXAM: CHEST  2 VIEW  COMPARISON:  09/06/2013  FINDINGS: Cardiac shadow remains enlarged. The lungs are well aerated bilaterally. No focal infiltrate or sizable effusion is seen. No acute bony  abnormality is seen.  IMPRESSION: Stable cardiomegaly.  No acute abnormality is noted.   Electronically Signed   By: Inez Catalina M.D.   On: 06/07/2014 18:47    EKG: Independently reviewed.  Assessment/Plan Principal Problem:   Hypertensive urgency Active Problems:   Essential hypertension   Acute on chronic diastolic CHF (congestive heart failure)   1. Hypertensive urgency - 1. HTN improved with labetalol 2. Labetalol PRN 3. Continue home HTN meds 2. Acute on chronic diastolic CHF - 1. Serial trops for chest pain 2. Lasix 40 mg IV x1 now 3. Continue spironolactone 3. Hyponatremia - Monitor with repeat  BMP in AM, has been ongoing for over the last month.    Code Status: Full Code  Family Communication: Family at bedside Disposition Plan: Admit to inpatient   Time spent: 70 min  Rena Hunke M. [facility] Pager [phone]  If 7AM-7PM, please contact the day team taking care of the patient [email or web address] Password [email or web address] 06/08/2014, 12:40 AM

## 2014-06-08 NOTE — Progress Notes (Signed)
  Echocardiogram 2D Echocardiogram has been performed.  Lysle Rubens 06/08/2014, 5:21 PM

## 2014-06-08 NOTE — Progress Notes (Signed)
[facility] PROGRESS NOTE  [name] [name] [id number]:[id number] DOB: 1929-12-21 DOA: 06/07/2014 PCP: Lilian Coma, MD  Assessment/Plan: 1. Malignant HTN 1. BP improving 2. Now on metoprolol 25mg , resumed home lisinopril 2. Acute on chronic diastolic CHF 1. Cardiology following 2. Cont on bumex as tolerated 3. On ACEI 4. Follow I/O and daily wt 3. Elevated trop 1. Peak of 0.6 2. Unclear etiology 3. Cardiology following 4. Hyponatremia 1. Likely secondary to volume overload 2. Diurese as tolerated 5. Chronic afib 1. Pt declines anticoagulation secondary to concerns of developing transfusion dependent bleeding 2. Stable thus far 6. DVT prophylaxis 1. Heparin subq  Code Status: Full Family Communication: Pt in room (indicate person spoken with, relationship, and if by phone, the number) Disposition Plan: Pending   Consultants:  Cardiology  Procedures:    Antibiotics:   (indicate start date, and stop date if known)  HPI/Subjective: No acute events. Wants to go home  Objective: Filed Vitals:   06/08/14 0100 06/08/14 0140 06/08/14 0335 06/08/14 0933  BP: 182/93 197/104 146/83 140/74  Pulse: 70 77 71 64  Temp:  97.6 F (36.4 C) 97.8 F (36.6 C)   TempSrc:  Oral Oral   Resp: 24 24 20    Height:  5' (1.524 m)    Weight:  51.6 kg (113 lb 12.1 oz)    SpO2: 97% 96% 96%     Intake/Output Summary (Last 24 hours) at 06/08/14 1338 Last data filed at 06/08/14 1252  Gross per 24 hour  Intake 395.83 ml  Output   2975 ml  Net -2579.17 ml   Filed Weights   06/08/14 0140  Weight: 51.6 kg (113 lb 12.1 oz)    Exam:   General:  Awake, in nad  Cardiovascular: regular, s1, s2  Respiratory: normal resp effort, no wheezing  Abdomen: soft, nondistended  Musculoskeletal: perfused, 2-3+ B LE edema   Data Reviewed: Basic Metabolic Panel:  Recent Labs Lab 06/07/14 2207 06/08/14 0144  NA 129* 127*  K 4.7 4.5  CL 93* 92*  CO2 27 26  GLUCOSE 121* 133*   BUN 36* 36*  CREATININE 1.27* 1.21*  CALCIUM 9.5 9.4   Liver Function Tests: No results for input(s): AST, ALT, ALKPHOS, BILITOT, PROT, ALBUMIN in the last 168 hours. No results for input(s): LIPASE, AMYLASE in the last 168 hours. No results for input(s): AMMONIA in the last 168 hours. CBC:  Recent Labs Lab 06/07/14 2207 06/08/14 0144  WBC 7.1 7.6  NEUTROABS 6.0  --   HGB 14.8 15.2*  HCT 45.1 45.0  MCV 90.7 89.8  PLT 268 230   Cardiac Enzymes:  Recent Labs Lab 06/08/14 0045 06/08/14 0530 06/08/14 1225  TROPONINI 0.06* 0.06* 0.07*   BNP (last 3 results) No results for input(s): PROBNP in the last 8760 hours. CBG: No results for input(s): GLUCAP in the last 168 hours.  No results found for this or any previous visit (from the past 240 hour(s)).   Studies: Dg Chest 2 View (if Patient Has Fever And/or Copd)  06/07/2014   CLINICAL DATA:  Left lower chest pain  EXAM: CHEST  2 VIEW  COMPARISON:  09/06/2013  FINDINGS: Cardiac shadow remains enlarged. The lungs are well aerated bilaterally. No focal infiltrate or sizable effusion is seen. No acute bony abnormality is seen.  IMPRESSION: Stable cardiomegaly.  No acute abnormality is noted.   Electronically Signed   By: Inez Catalina M.D.   On: 06/07/2014 18:47    Scheduled Meds: . aspirin  81  mg Oral Daily  . bumetanide  0.5 mg Intravenous Daily  . heparin subcutaneous  5,000 Units Subcutaneous 3 times per day  . latanoprost  1 drop Both Eyes Daily  . levothyroxine  50 mcg Oral QAC breakfast  . metoprolol succinate  25 mg Oral Daily  . sodium chloride  3 mL Intravenous Q12H  . sodium chloride  3 mL Intravenous Q12H  . spironolactone  25 mg Oral BID   Continuous Infusions:   Principal Problem:   Hypertensive urgency Active Problems:   Essential hypertension   Acute on chronic diastolic CHF (congestive heart failure)    Time spent: 56min    CH[facility], [facility] Pager [phone]. If 7PM-7AM,  please contact night-coverage at [email or web address], password [facility] 06/08/2014, 1:38 PM  LOS: 1 day

## 2014-06-09 DIAGNOSIS — E871 Hypo-osmolality and hyponatremia: Secondary | ICD-10-CM

## 2014-06-09 DIAGNOSIS — R079 Chest pain, unspecified: Secondary | ICD-10-CM

## 2014-06-09 DIAGNOSIS — I447 Left bundle-branch block, unspecified: Secondary | ICD-10-CM

## 2014-06-09 LAB — BASIC METABOLIC PANEL
ANION GAP: 4 — AB (ref 5–15)
BUN: 40 mg/dL — ABNORMAL HIGH (ref 6–23)
CALCIUM: 8.5 mg/dL (ref 8.4–10.5)
CO2: 29 mmol/L (ref 19–32)
Chloride: 92 mEq/L — ABNORMAL LOW (ref 96–112)
Creatinine, Ser: 1.16 mg/dL — ABNORMAL HIGH (ref 0.50–1.10)
GFR calc Af Amer: 49 mL/min — ABNORMAL LOW (ref >90)
GFR calc non Af Amer: 43 mL/min — ABNORMAL LOW (ref >90)
GLUCOSE: 95 mg/dL (ref 70–99)
POTASSIUM: 3.9 mmol/L (ref 3.5–5.1)
SODIUM: 125 mmol/L — AB (ref 135–145)

## 2014-06-09 LAB — CBC
HCT: 40.6 % (ref 36.0–46.0)
HEMOGLOBIN: 13.6 g/dL (ref 12.0–15.0)
MCH: 29.8 pg (ref 26.0–34.0)
MCHC: 33.5 g/dL (ref 30.0–36.0)
MCV: 89 fL (ref 78.0–100.0)
PLATELETS: 213 10*3/uL (ref 150–400)
RBC: 4.56 MIL/uL (ref 3.87–5.11)
RDW: 14.1 % (ref 11.5–15.5)
WBC: 6.7 10*3/uL (ref 4.0–10.5)

## 2014-06-09 MED ORDER — LISINOPRIL 5 MG PO TABS
5.0000 mg | ORAL_TABLET | Freq: Every day | ORAL | Status: DC
  Administered 2014-06-09 – 2014-06-10 (×2): 5 mg via ORAL
  Filled 2014-06-09 (×2): qty 1

## 2014-06-09 MED ORDER — METOPROLOL SUCCINATE ER 25 MG PO TB24
37.5000 mg | ORAL_TABLET | Freq: Every day | ORAL | Status: DC
  Administered 2014-06-09 – 2014-06-14 (×6): 37.5 mg via ORAL
  Filled 2014-06-09 (×6): qty 2

## 2014-06-09 NOTE — Clinical Documentation Improvement (Signed)
  Query #1  Cardiomegaly per CXR this admission.  Severe LVH and multiple valve abnormalities by Echo this admission.  Admitted with Acute on Chronic Diastolic Heart Failure and Hypertensive Urgency.  Treated with IV Bumex this admission.  Possible Clinical Conditions:   - Hypertensive Heart Disease   - Other Condition   - Unable to Clinically Determine   Query #2  "Renal Insufficiency" documented in the Cardiology Consult 06/08/14.  Please document in the progress notes and discharge summary if a condition below provides greater specificity regarding the patient's renal insufficiency:   - Stage 3 CKD   - Other Condition   - Unable to Clinically Determine   Thank You, Erling Conte ,RN Clinical Documentation Specialist:  [phone] [place] [facility]

## 2014-06-09 NOTE — Progress Notes (Signed)
[facility] PROGRESS NOTE  Divya Munshi KDX:[id number] DOB: 05/29/1930 DOA: 06/07/2014 PCP: Lilian Coma, MD  Assessment/Plan: 1. Malignant HTN 1. BP improved 2. Remains on metoprolol 37.5mg , resumed lisinopril 2. Acute on chronic diastolic CHF 1. Cardiology following 2. Cont on bumex as tolerated 3. On ACEI 4. Follow I/O and daily wt 5. 51.6kg->49.6kg 3. Elevated trop 1. Peak of 0.7 2. Unclear etiology but doubt ACS 3. Cardiology following 4. Hyponatremia 1. Likely secondary to volume overload 2. Diurese as tolerated 5. Chronic afib 1. Pt declines anticoagulation secondary to concerns of developing transfusion dependent bleeding 2. Stable thus far 6. DVT prophylaxis 1. Heparin subq  Code Status: Full Family Communication: Pt in room Disposition Plan: Pending   Consultants:  Cardiology  Procedures:    Antibiotics:   (indicate start date, and stop date if known)  HPI/Subjective: Feels better. Enjoying hospital food  Objective: Filed Vitals:   06/08/14 1414 06/08/14 2330 06/09/14 0607 06/09/14 1013  BP: 117/58 146/73 157/78 125/73  Pulse: 73 66 76 73  Temp: 97.9 F (36.6 C) 98 F (36.7 C) 98 F (36.7 C)   TempSrc: Oral Oral Oral   Resp: 18 18 18    Height:      Weight:   49.6 kg (109 lb 5.6 oz)   SpO2: 97% 98% 95%     Intake/Output Summary (Last 24 hours) at 06/09/14 1149 Last data filed at 06/09/14 0700  Gross per 24 hour  Intake    420 ml  Output   1325 ml  Net   -905 ml   Filed Weights   06/08/14 0140 06/09/14 0607  Weight: 51.6 kg (113 lb 12.1 oz) 49.6 kg (109 lb 5.6 oz)    Exam:   General:  Awake, in nad  Cardiovascular: regular, s1, s2  Respiratory: normal resp effort, no wheezing  Abdomen: soft, nondistended  Musculoskeletal: perfused, 2-3+ B LE edema   Data Reviewed: Basic Metabolic Panel:  Recent Labs Lab 06/07/14 2207 06/08/14 0144 06/09/14 0513  NA 129* 127* 125*  K 4.7 4.5 3.9  CL 93* 92* 92*   CO2 27 26 29   GLUCOSE 121* 133* 95  BUN 36* 36* 40*  CREATININE 1.27* 1.21* 1.16*  CALCIUM 9.5 9.4 8.5   Liver Function Tests: No results for input(s): AST, ALT, ALKPHOS, BILITOT, PROT, ALBUMIN in the last 168 hours. No results for input(s): LIPASE, AMYLASE in the last 168 hours. No results for input(s): AMMONIA in the last 168 hours. CBC:  Recent Labs Lab 06/07/14 2207 06/08/14 0144 06/09/14 0513  WBC 7.1 7.6 6.7  NEUTROABS 6.0  --   --   HGB 14.8 15.2* 13.6  HCT 45.1 45.0 40.6  MCV 90.7 89.8 89.0  PLT 268 230 213   Cardiac Enzymes:  Recent Labs Lab 06/08/14 0045 06/08/14 0530 06/08/14 1225  TROPONINI 0.06* 0.06* 0.07*   BNP (last 3 results) No results for input(s): PROBNP in the last 8760 hours. CBG: No results for input(s): GLUCAP in the last 168 hours.  No results found for this or any previous visit (from the past 240 hour(s)).   Studies: Dg Chest 2 View (if Patient Has Fever And/or Copd)  06/07/2014   CLINICAL DATA:  Left lower chest pain  EXAM: CHEST  2 VIEW  COMPARISON:  09/06/2013  FINDINGS: Cardiac shadow remains enlarged. The lungs are well aerated bilaterally. No focal infiltrate or sizable effusion is seen. No acute bony abnormality is seen.  IMPRESSION: Stable cardiomegaly.  No acute abnormality is noted.  Electronically Signed   By: Inez Catalina M.D.   On: 06/07/2014 18:47    Scheduled Meds: . aspirin  81 mg Oral Daily  . bumetanide  0.5 mg Intravenous Daily  . heparin subcutaneous  5,000 Units Subcutaneous 3 times per day  . latanoprost  1 drop Both Eyes Daily  . levothyroxine  50 mcg Oral QAC breakfast  . metoprolol succinate  37.5 mg Oral Daily  . sodium chloride  3 mL Intravenous Q12H  . sodium chloride  3 mL Intravenous Q12H  . spironolactone  25 mg Oral BID   Continuous Infusions:   Principal Problem:   Hypertensive urgency Active Problems:   Essential hypertension   Acute on chronic diastolic CHF (congestive heart failure)    Pain in the chest   Hyponatremia  Time spent: 6min  CH[facility], [facility] Pager [phone]. If 7PM-7AM, please contact night-coverage at [email or web address], password [facility] 06/09/2014, 11:49 AM  LOS: 2 days

## 2014-06-09 NOTE — Progress Notes (Signed)
Subjective:   Breathing better, but still notes twinges of left chest ache   Objective:   Vital Signs in the last 24 hours: Temp:  [97.9 F (36.6 C)-98 F (36.7 C)] 98 F (36.7 C) (12/28 0607) Pulse Rate:  [64-76] 76 (12/28 0607) Resp:  [18] 18 (12/28 0607) BP: (117-157)/(58-78) 157/78 mmHg (12/28 0607) SpO2:  [95 %-98 %] 95 % (12/28 0607) Weight:  [109 lb 5.6 oz (49.6 kg)] 109 lb 5.6 oz (49.6 kg) (12/28 3491)  Intake/Output from previous day: 12/27 0701 - 12/28 0700 In: 540 [P.O.:540] Out: 2225 [Urine:2225]   I/O since admission: -3279  Medications: . aspirin  81 mg Oral Daily  . bumetanide  0.5 mg Intravenous Daily  . heparin subcutaneous  5,000 Units Subcutaneous 3 times per day  . latanoprost  1 drop Both Eyes Daily  . levothyroxine  50 mcg Oral QAC breakfast  . metoprolol succinate  25 mg Oral Daily  . sodium chloride  3 mL Intravenous Q12H  . sodium chloride  3 mL Intravenous Q12H  . spironolactone  25 mg Oral BID       Physical Exam:   General appearance: alert, cooperative and no distress Neck: no adenopathy, no carotid bruit, no JVD, supple, symmetrical, trachea midline and thyroid not enlarged, symmetric, no tenderness/mass/nodules Lungs: clear to auscultation bilaterally Heart: irregularly irregular rhythm and 2/6 sem lsb Abdomen: soft, non-tender; bowel sounds normal; no masses,  no organomegaly Extremities: 1+ edema Pulses: 2+ and symmetric Neurologic: Grossly normal   Rate: 74  Rhythm: atrial fibrillation   ECG (independently read by me): AF at 88 with LBBB with repolarization changes  Lab Results:   Recent Labs  06/08/14 0144 06/09/14 0513  NA 127* 125*  K 4.5 3.9  CL 92* 92*  CO2 26 29  GLUCOSE 133* 95  BUN 36* 40*  CREATININE 1.21* 1.16*   CBC Latest Ref Rng 06/09/2014 06/08/2014 06/07/2014  WBC 4.0 - 10.5 K/uL 6.7 7.6 7.1  Hemoglobin 12.0 - 15.0 g/dL 13.6 15.2(H) 14.8  Hematocrit 36.0 - 46.0 % 40.6 45.0 45.1  Platelets 150  - 400 K/uL 213 230 268      Recent Labs  06/08/14 0530 06/08/14 1225  TROPONINI 0.06* 0.07*    Hepatic Function Panel No results for input(s): PROT, ALBUMIN, AST, ALT, ALKPHOS, BILITOT, BILIDIR, IBILI in the last 72 hours.  Recent Labs  06/08/14 0530  INR 1.19   BNP (last 3 results) No results for input(s): PROBNP in the last 8760 hours.  Lipid Panel     Component Value Date/Time   CHOL 190 06/28/2010 1054   TRIG 71.0 06/28/2010 1054   HDL 84.10 06/28/2010 1054   CHOLHDL 2 06/28/2010 1054   VLDL 14.2 06/28/2010 1054   LDLCALC 92 06/28/2010 1054      Imaging:  Dg Chest 2 View (if Patient Has Fever And/or Copd)  06/07/2014   CLINICAL DATA:  Left lower chest pain  EXAM: CHEST  2 VIEW  COMPARISON:  09/06/2013  FINDINGS: Cardiac shadow remains enlarged. The lungs are well aerated bilaterally. No focal infiltrate or sizable effusion is seen. No acute bony abnormality is seen.  IMPRESSION: Stable cardiomegaly.  No acute abnormality is noted.   Electronically Signed   By: Inez Catalina M.D.   On: 06/07/2014 18:47   Echo Study Conclusions  - Left ventricle: The cavity size was normal. Wall thickness was increased in a pattern of moderate to severe LVH. Systolic function was normal. The estimated  ejection fraction was in the range of 50% to 55%. Wall motion was normal; there were no regional wall motion abnormalities. - Ventricular septum: Septal motion showed abnormal function and dyssynergy. These changes are consistent with a left bundle branch block. - Aortic valve: There was mild to moderate regurgitation. - Mitral valve: There was moderate to severe regurgitation. - Left atrium: The atrium was massively dilated. - Right atrium: The atrium was massively dilated. - Tricuspid valve: There was severe regurgitation. - Pulmonary arteries: Systolic pressure was moderately to severely increased. PA peak pressure: 64 mm Hg (S).   Assessment/Plan:    Principal Problem:   Hypertensive urgency Active Problems:   Essential hypertension   Acute on chronic diastolic CHF (congestive heart failure)   Pain in the chest   Hyponatremia   1. Atypical chest pain-symptoms occurred after falling and hitting left chest area. She complains of twinges of pain; tightness has improved. Troponins are 0.06 and 0.07  Doubt ACS. Patient's electrocardiogram shows left bundle branch block which is unchanged.  2.  Permanent atrial fibrillation-patient's heart rate is controlled.  Now on toprol xl 25 mg; will titrate to 37.5 mg today.  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 She understands the higher risk of stroke with aspirin compared to Coumadin. 3. Hypertension; BP better today should further improve with increased toprol and diuresis. 4. acute on chronic diastolic congestive heart failure-patient is volume overloaded.  Excellent diuresis. Will check BNP today. 5. AR: mild to moderate 6. MR:  Moderate to severe 7. Severe pulmonary HTN with est PA pp 64 mmHg. 8. Hyponatremia at 125; need to fluid restrict and continue diuresis    Troy Sine, MD, [facility] 06/09/2014, 8:06 AM

## 2014-06-10 LAB — BASIC METABOLIC PANEL
Anion gap: 6 (ref 5–15)
BUN: 44 mg/dL — ABNORMAL HIGH (ref 6–23)
CHLORIDE: 92 meq/L — AB (ref 96–112)
CO2: 30 mmol/L (ref 19–32)
Calcium: 9 mg/dL (ref 8.4–10.5)
Creatinine, Ser: 1.51 mg/dL — ABNORMAL HIGH (ref 0.50–1.10)
GFR calc non Af Amer: 31 mL/min — ABNORMAL LOW (ref >90)
GFR, EST AFRICAN AMERICAN: 36 mL/min — AB (ref >90)
GLUCOSE: 96 mg/dL (ref 70–99)
POTASSIUM: 4.3 mmol/L (ref 3.5–5.1)
SODIUM: 128 mmol/L — AB (ref 135–145)

## 2014-06-10 LAB — CBC
HCT: 41.1 % (ref 36.0–46.0)
HEMOGLOBIN: 13.7 g/dL (ref 12.0–15.0)
MCH: 30 pg (ref 26.0–34.0)
MCHC: 33.3 g/dL (ref 30.0–36.0)
MCV: 90.1 fL (ref 78.0–100.0)
Platelets: 216 10*3/uL (ref 150–400)
RBC: 4.56 MIL/uL (ref 3.87–5.11)
RDW: 14.3 % (ref 11.5–15.5)
WBC: 6.6 10*3/uL (ref 4.0–10.5)

## 2014-06-10 LAB — BRAIN NATRIURETIC PEPTIDE: B NATRIURETIC PEPTIDE 5: 715.3 pg/mL — AB (ref 0.0–100.0)

## 2014-06-10 MED ORDER — LISINOPRIL 2.5 MG PO TABS
2.5000 mg | ORAL_TABLET | Freq: Every day | ORAL | Status: DC
  Administered 2014-06-11 – 2014-06-14 (×4): 2.5 mg via ORAL
  Filled 2014-06-10 (×4): qty 1

## 2014-06-10 MED ORDER — DILTIAZEM HCL ER COATED BEADS 120 MG PO TB24
120.0000 mg | ORAL_TABLET | Freq: Every day | ORAL | Status: DC
  Administered 2014-06-10 – 2014-06-14 (×5): 120 mg via ORAL
  Filled 2014-06-10 (×6): qty 1

## 2014-06-10 NOTE — Progress Notes (Addendum)
Subjective:   Breathing better; chest pain improved  Objective:   Vital Signs in the last 24 hours: Temp:  [97.6 F (36.4 C)-98 F (36.7 C)] 97.6 F (36.4 C) (12/29 0703) Pulse Rate:  [64-77] 77 (12/29 0703) Resp:  [16-18] 16 (12/29 0703) BP: (117-179)/(52-94) 179/94 mmHg (12/29 0703) SpO2:  [94 %-97 %] 97 % (12/29 0703) Weight:  [109 lb 2 oz (49.5 kg)] 109 lb 2 oz (49.5 kg) (12/29 0500)  Intake/Output from previous day: 12/28 0701 - 12/29 0700 In: 363 [P.O.:360; I.V.:3] Out: 1300 [Urine:1300]   I/O since admission: -4716  Medications: . aspirin  81 mg Oral Daily  . bumetanide  0.5 mg Intravenous Daily  . heparin subcutaneous  5,000 Units Subcutaneous 3 times per day  . latanoprost  1 drop Both Eyes Daily  . levothyroxine  50 mcg Oral QAC breakfast  . lisinopril  5 mg Oral Daily  . metoprolol succinate  37.5 mg Oral Daily  . sodium chloride  3 mL Intravenous Q12H  . sodium chloride  3 mL Intravenous Q12H  . spironolactone  25 mg Oral BID       Physical Exam:   General appearance: alert, cooperative and no distress Neck: no adenopathy, no carotid bruit, no JVD, supple, symmetrical, trachea midline and thyroid not enlarged, symmetric, no tenderness/mass/nodules Lungs: clear to auscultation bilaterally Heart: irregularly irregular rhythm and 2/6 sem lsb Abdomen: soft, non-tender; bowel sounds normal; no masses,  no organomegaly Extremities: 1+ edema Pulses: 2+ and symmetric Neurologic: Grossly normal   Rate: 70   Rhythm: atrial fibrillation   ECG (independently read by me): AF at 88 with LBBB with repolarization changes  Lab Results:   Recent Labs  06/09/14 0513 06/10/14 0437  NA 125* 128*  K 3.9 4.3  CL 92* 92*  CO2 29 30  GLUCOSE 95 96  BUN 40* 44*  CREATININE 1.16* 1.51*   CBC Latest Ref Rng 06/10/2014 06/09/2014 06/08/2014  WBC 4.0 - 10.5 K/uL 6.6 6.7 7.6  Hemoglobin 12.0 - 15.0 g/dL 13.7 13.6 15.2(H)  Hematocrit 36.0 - 46.0 % 41.1 40.6  45.0  Platelets 150 - 400 K/uL 216 213 230      Recent Labs  06/08/14 0530 06/08/14 1225  TROPONINI 0.06* 0.07*    Hepatic Function Panel No results for input(s): PROT, ALBUMIN, AST, ALT, ALKPHOS, BILITOT, BILIDIR, IBILI in the last 72 hours.  Recent Labs  06/08/14 0530  INR 1.19   BNP (last 3 results) No results for input(s): PROBNP in the last 8760 hours.  Lipid Panel     Component Value Date/Time   CHOL 190 06/28/2010 1054   TRIG 71.0 06/28/2010 1054   HDL 84.10 06/28/2010 1054   CHOLHDL 2 06/28/2010 1054   VLDL 14.2 06/28/2010 1054   LDLCALC 92 06/28/2010 1054      Imaging:  No results found. Echo Study Conclusions  - Left ventricle: The cavity size was normal. Wall thickness was increased in a pattern of moderate to severe LVH. Systolic function was normal. The estimated ejection fraction was in the range of 50% to 55%. Wall motion was normal; there were no regional wall motion abnormalities. - Ventricular septum: Septal motion showed abnormal function and dyssynergy. These changes are consistent with a left bundle branch block. - Aortic valve: There was mild to moderate regurgitation. - Mitral valve: There was moderate to severe regurgitation. - Left atrium: The atrium was massively dilated. - Right atrium: The atrium was massively dilated. - Tricuspid valve:  There was severe regurgitation. - Pulmonary arteries: Systolic pressure was moderately to severely increased. PA peak pressure: 64 mm Hg (S).   Assessment/Plan:   Principal Problem:   Hypertensive urgency Active Problems:   Essential hypertension   Acute on chronic diastolic CHF (congestive heart failure)   Pain in the chest   Hyponatremia   1. Atypical chest pain-symptoms occurred after falling and hitting left chest area.  Improved. Troponins are 0.06 and 0.07  Doubt ACS. Patient's electrocardiogram shows left bundle branch block which is unchanged.  2.  Permanent  atrial fibrillation-patient's heart rate is controlled.  Toprol XL was titrated yesterday to 37.5 mg; HR controlled in the 70's. Recommend ambulation to see if adequately controlled with activity and if not then plan to titrate to 50 mg.  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 She understands the higher risk of stroke with aspirin compared to Coumadin. 3. Hypertension;   Still elevated earlier today at 179/94; will not titrate lisinopril with Cr increased from 1.16 to 1.51; f/u renal fxn closely; With ??? Amlodipine allergy, will add cardizem 120 mg daily which will also assist with rate control. 4. acute on chronic diastolic congestive heart failure-patient is volume overloaded.  Excellent diuresis. Will check BNP today. 5. AR: mild to moderate 6. MR:  Moderate to severe 7. Severe pulmonary HTN with est PA pp 64 mmHg. 8. Hyponatremia improved today at 128 but still low.   Troy Sine, MD, [facility] 06/10/2014, 10:05 AM

## 2014-06-10 NOTE — Progress Notes (Signed)
[facility] PROGRESS NOTE  [name] [name] IDP:[id number] DOB: [birth year] DOA: 06/07/2014 PCP: [name] Coma, MD  Assessment/Plan: 1. Malignant HTN 1. BP improved 2. On metoprolol 37.5mg , resumed lisinopril at 5 mg (home dose of 40mg ) however Cr increased to 1.51 from 1.1 thus dose reduced to 2.5mg  3. Cardizem 120mg  added by Cardiology 2. Acute on chronic diastolic CHF 1. Cardiology following 2. Cont on bumex as tolerated 3. On ACEI per above 4. Follow I/O and daily wt 5. 51.6kg->49.6kg->49.5kg 3. Elevated trop 1. Peak of 0.7 2. Unclear etiology but doubt ACS 3. Cardiology following 4. Hyponatremia 1. Likely secondary to volume overload 2. Diurese as tolerated 3. Improving 5. Chronic afib 1. Pt declines anticoagulation secondary to concerns of developing transfusion dependent bleeding 2. Remains stable thus far and rate controlled 6. DVT prophylaxis 1. Heparin subq  Code Status: Full Family Communication: Pt in room, sister at bedside Disposition Plan: Pending   Consultants:  Cardiology  Procedures:    Antibiotics:     HPI/Subjective: Eager to go home. No complaints.  Objective: Filed Vitals:   06/09/14 2208 06/10/14 0500 06/10/14 0703 06/10/14 1410  BP: 157/89  179/94 119/58  Pulse: 74  77 71  Temp: 97.7 F (36.5 C)  97.6 F (36.4 C) 97.6 F (36.4 C)  TempSrc: Oral  Oral Oral  Resp: 18  16 16   Height:      Weight:  49.5 kg (109 lb 2 oz)    SpO2: 97%  97% 95%    Intake/Output Summary (Last 24 hours) at 06/10/14 1528 Last data filed at 06/10/14 1300  Gross per 24 hour  Intake    363 ml  Output   1925 ml  Net  -1562 ml   Filed Weights   06/08/14 0140 06/09/14 0607 06/10/14 0500  Weight: 51.6 kg (113 lb 12.1 oz) 49.6 kg (109 lb 5.6 oz) 49.5 kg (109 lb 2 oz)    Exam:   General:  Awake, in nad  Cardiovascular: regular, s1, s2  Respiratory: normal resp effort, no wheezing  Abdomen: soft, nondistended  Musculoskeletal:  perfused, 2-3+ B LE edema   Data Reviewed: Basic Metabolic Panel:  Recent Labs Lab 06/07/14 2207 06/08/14 0144 06/09/14 0513 06/10/14 0437  NA 129* 127* 125* 128*  K 4.7 4.5 3.9 4.3  CL 93* 92* 92* 92*  CO2 27 26 29 30   GLUCOSE 121* 133* 95 96  BUN 36* 36* 40* 44*  CREATININE 1.27* 1.21* 1.16* 1.51*  CALCIUM 9.5 9.4 8.5 9.0   Liver Function Tests: No results for input(s): AST, ALT, ALKPHOS, BILITOT, PROT, ALBUMIN in the last 168 hours. No results for input(s): LIPASE, AMYLASE in the last 168 hours. No results for input(s): AMMONIA in the last 168 hours. CBC:  Recent Labs Lab 06/07/14 2207 06/08/14 0144 06/09/14 0513 06/10/14 0437  WBC 7.1 7.6 6.7 6.6  NEUTROABS 6.0  --   --   --   HGB 14.8 15.2* 13.6 13.7  HCT 45.1 45.0 40.6 41.1  MCV 90.7 89.8 89.0 90.1  PLT 268 230 213 216   Cardiac Enzymes:  Recent Labs Lab 06/08/14 0045 06/08/14 0530 06/08/14 1225  TROPONINI 0.06* 0.06* 0.07*   BNP (last 3 results) No results for input(s): PROBNP in the last 8760 hours. CBG: No results for input(s): GLUCAP in the last 168 hours.  No results found for this or any previous visit (from the past 240 hour(s)).   Studies: No results found.  Scheduled Meds: . aspirin  81 mg  Oral Daily  . bumetanide  0.5 mg Intravenous Daily  . diltiazem  120 mg Oral Daily  . heparin subcutaneous  5,000 Units Subcutaneous 3 times per day  . latanoprost  1 drop Both Eyes Daily  . levothyroxine  50 mcg Oral QAC breakfast  . [START ON 06/11/2014] lisinopril  2.5 mg Oral Daily  . metoprolol succinate  37.5 mg Oral Daily  . sodium chloride  3 mL Intravenous Q12H  . sodium chloride  3 mL Intravenous Q12H  . spironolactone  25 mg Oral BID   Continuous Infusions:   Principal Problem:   Hypertensive urgency Active Problems:   Essential hypertension   Acute on chronic diastolic CHF (congestive heart failure)   Pain in the chest   Hyponatremia  Time spent: 102min  [name] [name], [facility] Pager [phone]. If 7PM-7AM, please contact night-coverage at [email or web address], password [name] 06/10/2014, 3:28 PM  LOS: 3 days

## 2014-06-10 NOTE — Evaluation (Signed)
Physical Therapy One Time Evaluation Patient Details Name: [name] [name] MRN: [id number] DOB: 1929-11-29 Today's Date: 06/10/2014   History of Present Illness  [name] [name] is a 80 y.o. female who presents to the ED with c/o chest pain and admitted for malignant hypertension and acute on chronic CHF.  Patient had a fall a week ago, hit left side on wall.  Pain had gotten better; however, she developed severe, left sided chest pain.  Patient has also had 10 lb weight gain over the last month, nearly all of this is in the form of peripheral edema in her legs which are now very swollen.    Clinical Impression  Patient evaluated by Physical Therapy with no further acute PT needs identified. All education has been completed and the patient has no further questions. Pt reports her mobility is at baseline, which includes some unsteady gait and weakness for last few months however very agreeable to HHPT for strengthening and balance training.  Pt educated on using RW as trial today however she dislike using assistive device and reports she wouldn't use RW at home despite recommendation for safety.  Pt hopeful for d/c as soon as possible.  Recommend HHPT for f/u. PT is signing off. Thank you for this referral.     Follow Up Recommendations Home health PT    Equipment Recommendations  Rolling walker with 5" wheels (pt declined)    Recommendations for Other Services       Precautions / Restrictions Precautions Precautions: Fall Restrictions Weight Bearing Restrictions: No      Mobility  Bed Mobility Overal bed mobility: Modified Independent                Transfers Overall transfer level: Modified independent Equipment used: None             General transfer comment: Pt was Mod I transfers and functional mobility. Pt did not use grab bar in bathroom nor during sit to stand   Ambulation/Gait Ambulation/Gait assistance: Min guard Ambulation Distance (Feet): 240  Feet Assistive device: None;Rolling walker (2 wheeled) Gait Pattern/deviations: Step-through pattern;Narrow base of support;Decreased stride length     General Gait Details: pt reports frequent unsteady gait on normal basis, pt with one instance of LOB however able to self correct, steadiness improved with use of RW upon return to room however pt dislikes using RW and likely will not use at home  Stairs            Wheelchair Mobility    Modified Rankin (Stroke Patients Only)       Balance Overall balance assessment: History of Falls;Needs assistance (pt reports unsteadiness and "catching" herself on wall or furniture)                           High level balance activites: Direction changes;Turns;Sudden stops;Head turns High Level Balance Comments: pt must slow down to perform all of the above, actually needed to stop ambulating to turn her head, pt reports poor balance at baseline however has not had any therapy for this             Pertinent Vitals/Pain Pain Assessment: No/denies pain    Home Living Family/patient expects to be discharged to:: Private residence Living Arrangements:  (Lives with sister, pt takes care of her sister.)   Type of Home: Other(Comment) ([place]) Home Access: Stairs to enter Entrance Stairs-Rails: None Technical brewer of Steps: 1-2 STE Home Layout: Two level  Home Equipment: Other (comment);None (May have some older equipment from her father/sister whom passed away 2002-09-07)      Prior Function Level of Independence: Independent with assistive device(s)         Comments: Pt reports that she gets SOB and often uses scooter at stores when shopping     Hand Dominance   Dominant Hand: Right    Extremity/Trunk Assessment   Upper Extremity Assessment: Overall WFL for tasks assessed           Lower Extremity Assessment: Generalized weakness         Communication   Communication: No difficulties   Cognition Arousal/Alertness: Awake/alert Behavior During Therapy: WFL for tasks assessed/performed Overall Cognitive Status: Within Functional Limits for tasks assessed                      General Comments General comments (skin integrity, edema, etc.): edematous lower legs and feet, pt reports occasionally occurs however currently worse then normal    Exercises        Assessment/Plan    PT Assessment All further PT needs can be met in the next venue of care  PT Diagnosis Difficulty walking   PT Problem List Decreased balance  PT Treatment Interventions     PT Goals (Current goals can be found in the Care Plan section) Acute Rehab PT Goals Patient Stated Goal: "I want to go home, when can I go home?" PT Goal Formulation: All assessment and education complete, DC therapy    Frequency     Barriers to discharge        Co-evaluation               End of Session   Activity Tolerance: Patient tolerated treatment well Patient left: with call bell/phone within reach;in bed;with family/visitor present           Time: 8592-9244 PT Time Calculation (min) (ACUTE ONLY): 13 min   Charges:   PT Evaluation $Initial PT Evaluation Tier I: 1 Procedure PT Treatments $Gait Training: 8-22 mins   PT G Codes:        Branston Halsted,KATHrine E 06/10/2014, 12:46 PM Carmelia Bake, PT, DPT 06/10/2014 Pager: [phone]

## 2014-06-10 NOTE — Evaluation (Signed)
Occupational Therapy Evaluation Patient Details Name: [name] [name] MRN: [id number] DOB: 11-29-1929 Today's Date: 06/10/2014    History of Present Illness [name] [name] is a 80 y.o. female who presents to the ED with c/o chest pain.  Patient had a fall a week ago, hit left side on wall.  Pain had gotten better; however, she developed severe, left sided chest pain.  Patient has also had 10 lb weight gain over the last month, nearly all of this is in the form of peripheral edema in her legs which are now very swollen.   Clinical Impression   Pt is overall Mod I for functional mobility and LB ADL/transfers. She reports feeling "depressed" about her overall physical condition & was advised to discuss w/ her physician to address. She denies any acute OT needs at this time but will benefit from PT assessment as she lives in a town home w/ bedroom on 2nd floor. Will sign off acute OT at this time.    Follow Up Recommendations  No OT follow up    Equipment Recommendations  None recommended by OT (Pt reports no needs)    Recommendations for Other Services  PT assessment     Precautions / Restrictions Restrictions Weight Bearing Restrictions: No      Mobility Bed Mobility Overal bed mobility: Modified Independent                Transfers Overall transfer level: Modified independent Equipment used: None             General transfer comment: Pt was Mod I transfers and functional mobility. Pt did not use grab bar in bathroom nor during sit to stand     Balance Overall balance assessment: No apparent balance deficits (not formally assessed);Modified Independent                                          ADL Overall ADL's : At baseline; Pt denies further OT needs.                                       General ADL Comments: Pt reports that she feels as though she is at baseline. Toilet transfers, bed mobility and functional mobility, LB  dressing are Mod I, pt moves slowly and reports that when she becomes SOB, she rests. Pt reports feeling "depressed" re: her physical condition and her heart. After OT assessment today, pt reports no further OT related concerns/needs.Pt was advised to discuss her feelings of depression w/ her physician and she verbalized understanding of this.     Vision  Wears glasses for reading, glaucoma                   Perception     Praxis      Pertinent Vitals/Pain Pain Assessment: No/denies pain     Hand Dominance Right   Extremity/Trunk Assessment Upper Extremity Assessment Upper Extremity Assessment: Overall WFL for tasks assessed   Lower Extremity Assessment Lower Extremity Assessment: Defer to PT evaluation       Communication Communication Communication: No difficulties   Cognition Arousal/Alertness: Awake/alert Behavior During Therapy: WFL for tasks assessed/performed Overall Cognitive Status: Within Functional Limits for tasks assessed  General Comments       Exercises       Shoulder Instructions      Home Living Family/patient expects to be discharged to:: Private residence Living Arrangements:  (Lives with sister, pt takes care of her sister.)   Type of Home: Other(Comment) ([place]) Home Access: Stairs to enter Technical brewer of Steps: 1-2 STE Entrance Stairs-Rails: None Home Layout: Two level Alternate Level Stairs-Number of Steps: 1 flight of stairs Alternate Level Stairs-Rails: Right Bathroom Shower/Tub: Teacher, early years/pre: Standard     Home Equipment: Other (comment);None (May have some older equipment from her father/sister whom passed away 2002/10/05)          Prior Functioning/Environment Level of Independence: Independent with assistive device(s)        Comments: Pt reports that she gets SOB and often uses scooter at stores when shopping    OT Diagnosis: Generalized weakness   OT  Problem List:     OT Treatment/Interventions:      OT Goals(Current goals can be found in the care plan section) Acute Rehab OT Goals Patient Stated Goal: "I want to go home, when can I go home?"  OT Frequency:     Barriers to D/C:            Co-evaluation              End of Session Equipment Utilized During Treatment: Gait belt  Activity Tolerance: Patient tolerated treatment well Patient left: in bed;with call bell/phone within reach;with family/visitor present   Time: 0951-1008 OT Time Calculation (min): 17 min Charges:  OT General Charges $OT Visit: 1 Procedure OT Evaluation $Initial OT Evaluation Tier I: 1 Procedure G-Codes:    Almyra Deforest, OT 06/10/2014, 10:16 AM

## 2014-06-11 DIAGNOSIS — R7989 Other specified abnormal findings of blood chemistry: Secondary | ICD-10-CM

## 2014-06-11 DIAGNOSIS — I482 Chronic atrial fibrillation, unspecified: Secondary | ICD-10-CM | POA: Diagnosis present

## 2014-06-11 DIAGNOSIS — N183 Chronic kidney disease, stage 3 (moderate): Secondary | ICD-10-CM

## 2014-06-11 DIAGNOSIS — R778 Other specified abnormalities of plasma proteins: Secondary | ICD-10-CM | POA: Diagnosis present

## 2014-06-11 LAB — CBC
HCT: 42.1 % (ref 36.0–46.0)
Hemoglobin: 13.9 g/dL (ref 12.0–15.0)
MCH: 30 pg (ref 26.0–34.0)
MCHC: 33 g/dL (ref 30.0–36.0)
MCV: 90.9 fL (ref 78.0–100.0)
PLATELETS: 193 10*3/uL (ref 150–400)
RBC: 4.63 MIL/uL (ref 3.87–5.11)
RDW: 14.3 % (ref 11.5–15.5)
WBC: 7.1 10*3/uL (ref 4.0–10.5)

## 2014-06-11 LAB — BASIC METABOLIC PANEL
ANION GAP: 5 (ref 5–15)
BUN: 43 mg/dL — ABNORMAL HIGH (ref 6–23)
CHLORIDE: 92 meq/L — AB (ref 96–112)
CO2: 31 mmol/L (ref 19–32)
CREATININE: 1.36 mg/dL — AB (ref 0.50–1.10)
Calcium: 9.2 mg/dL (ref 8.4–10.5)
GFR calc non Af Amer: 35 mL/min — ABNORMAL LOW (ref >90)
GFR, EST AFRICAN AMERICAN: 41 mL/min — AB (ref >90)
Glucose, Bld: 97 mg/dL (ref 70–99)
POTASSIUM: 4.6 mmol/L (ref 3.5–5.1)
Sodium: 128 mmol/L — ABNORMAL LOW (ref 135–145)

## 2014-06-11 NOTE — Progress Notes (Signed)
[facility] PROGRESS NOTE  [name] [name] NLG:[id number] DOB: 1930/03/29 DOA: 06/07/2014 PCP: Lilian Coma, MD  Assessment/Plan: #1 malignant hypertension Likely secondary to volume overload. Clinical improvement. Systolic blood pressure now in the 130s. Continue current regimen of metoprolol, lower dose of lisinopril, Cardizem. Cardiology following and appreciate input and recommendations.  #2 acute on chronic diastolic CHF exacerbation Unknown etiology. Troponins were slightly elevated however per cardiology not secondary to acute coronary syndrome. Patient with good diuresis on IV Bumex. Patient is -5.5 L during this hospitalization. Continue current regimen of IV Bumex, aspirin, Cardizem, lisinopril, Toprol-XL, Aldactone. Per cardiology.  #3 elevated troponins Per cardiology doubted ACS. Continue current medical management. Per cardiology.  #4 hyponatremia Likely secondary to volume overload secondary to acute on chronic diastolic CHF exacerbation. Improved with diuresis. Continue diuretics. Currently sodium levels at 128.Marland Kitchen Fluid restriction. Follow.  #5 chronic atrial fibrillation Currently rate controlled on Cardizem and Toprol-XL. Patient has declined anticoagulation in the past secondary to being a Jehovah's Witness. Aspirin. Per cardiology.  #6 chronic kidney disease stage III Stable. Creatinine has improved with decreased dose of ACE inhibitor.  #7 mild to moderate aortic regurgitation/mild to moderate mitral regurgitation Stable. Continue current cardiac medications. Per cardiology.  #8 severe pulmonary hypertension  #9 prophylaxis Heparin for DVT prophylaxis.  Code Status: Full Family Communication: Updated patient and sister at bedside. Disposition Plan: Home when medically stable.   Consultants:  Cardiology: Dr. Stanford Breed 06/08/2014  Procedures:  2-D echo 06/08/2014  Chest x-ray 06/07/2014  Antibiotics:  None  HPI/Subjective: Patient denies  any chest pain. Patient states shortness of breath has improved. Patient wondering when she can go home.  Objective: Filed Vitals:   06/11/14 1337  BP: 131/68  Pulse: 55  Temp: 97.6 F (36.4 C)  Resp: 18    Intake/Output Summary (Last 24 hours) at 06/11/14 1456 Last data filed at 06/11/14 1300  Gross per 24 hour  Intake    600 ml  Output    950 ml  Net   -350 ml   Filed Weights   06/09/14 0607 06/10/14 0500 06/11/14 0545  Weight: 49.6 kg (109 lb 5.6 oz) 49.5 kg (109 lb 2 oz) 49.487 kg (109 lb 1.6 oz)    Exam:   General:  NAD  Cardiovascular: Irregularly irregular with a 2/6 systolic ejection murmur.  Respiratory: CTAB  Abdomen: Soft, nontender, nondistended, positive bowel sounds.  Musculoskeletal: No clubbing or cyanosis. 1-2+ bilateral lower extremity edema.  Data Reviewed: Basic Metabolic Panel:  Recent Labs Lab 06/07/14 2207 06/08/14 0144 06/09/14 0513 06/10/14 0437 06/11/14 0520  NA 129* 127* 125* 128* 128*  K 4.7 4.5 3.9 4.3 4.6  CL 93* 92* 92* 92* 92*  CO2 27 26 29 30 31   GLUCOSE 121* 133* 95 96 97  BUN 36* 36* 40* 44* 43*  CREATININE 1.27* 1.21* 1.16* 1.51* 1.36*  CALCIUM 9.5 9.4 8.5 9.0 9.2   Liver Function Tests: No results for input(s): AST, ALT, ALKPHOS, BILITOT, PROT, ALBUMIN in the last 168 hours. No results for input(s): LIPASE, AMYLASE in the last 168 hours. No results for input(s): AMMONIA in the last 168 hours. CBC:  Recent Labs Lab 06/07/14 2207 06/08/14 0144 06/09/14 0513 06/10/14 0437 06/11/14 0520  WBC 7.1 7.6 6.7 6.6 7.1  NEUTROABS 6.0  --   --   --   --   HGB 14.8 15.2* 13.6 13.7 13.9  HCT 45.1 45.0 40.6 41.1 42.1  MCV 90.7 89.8 89.0 90.1 90.9  PLT 268 230 213  216 193   Cardiac Enzymes:  Recent Labs Lab 06/08/14 0045 06/08/14 0530 06/08/14 1225  TROPONINI 0.06* 0.06* 0.07*   BNP (last 3 results) No results for input(s): PROBNP in the last 8760 hours. CBG: No results for input(s): GLUCAP in the last 168  hours.  No results found for this or any previous visit (from the past 240 hour(s)).   Studies: No results found.  Scheduled Meds: . aspirin  81 mg Oral Daily  . bumetanide  0.5 mg Intravenous Daily  . diltiazem  120 mg Oral Daily  . heparin subcutaneous  5,000 Units Subcutaneous 3 times per day  . latanoprost  1 drop Both Eyes Daily  . levothyroxine  50 mcg Oral QAC breakfast  . lisinopril  2.5 mg Oral Daily  . metoprolol succinate  37.5 mg Oral Daily  . sodium chloride  3 mL Intravenous Q12H  . sodium chloride  3 mL Intravenous Q12H  . spironolactone  25 mg Oral BID   Continuous Infusions:   Principal Problem:   Hypertensive urgency Active Problems:   Essential hypertension   Acute on chronic diastolic CHF (congestive heart failure)   Pain in the chest   Hyponatremia    Time spent: 35 mins    [facility] MD [facility] Pager [phone]. If 7PM-7AM, please contact night-coverage at [email or web address], password [facility] 06/11/2014, 2:56 PM  LOS: 4 days

## 2014-06-11 NOTE — Progress Notes (Signed)
PT Cancellation Note  Patient Details Name: [name] [name] MRN: [id number] DOB: 03-Dec-1929   Cancelled Treatment:    Reason Eval/Treat Not Completed: PT screened, no needs identified, will sign off Checked with RN, if pt had any changes in mobility, none at this time.  PT and OT evaluated pt 06/10/14.  Please refer to these notes.  HHPT recommended upon d/c.   Wilhemina Grall,KATHrine E 06/11/2014, 2:43 PM Carmelia Bake, PT, DPT 06/11/2014 Pager: [phone]

## 2014-06-11 NOTE — Progress Notes (Signed)
Subjective:   Breathing better; chest pain resovled; feels well  Objective:   Vital Signs in the last 24 hours: Temp:  [97.6 F (36.4 C)-98.1 F (36.7 C)] 98 F (36.7 C) (12/30 0545) Pulse Rate:  [68-71] 69 (12/30 0545) Resp:  [16-18] 18 (12/30 0545) BP: (119-162)/(58-77) 162/77 mmHg (12/30 0545) SpO2:  [93 %-97 %] 93 % (12/30 0545) Weight:  [109 lb 1.6 oz (49.487 kg)] 109 lb 1.6 oz (49.487 kg) (12/30 0545)   Weight 113 --> 109   Intake/Output from previous day: 12/29 0701 - 12/30 0700 In: 360 [P.O.:360] Out: 1850 [Urine:1850]  Net: -1490  I/O since admission: -5466  Medications: . aspirin  81 mg Oral Daily  . bumetanide  0.5 mg Intravenous Daily  . diltiazem  120 mg Oral Daily  . heparin subcutaneous  5,000 Units Subcutaneous 3 times per day  . latanoprost  1 drop Both Eyes Daily  . levothyroxine  50 mcg Oral QAC breakfast  . lisinopril  2.5 mg Oral Daily  . metoprolol succinate  37.5 mg Oral Daily  . sodium chloride  3 mL Intravenous Q12H  . sodium chloride  3 mL Intravenous Q12H  . spironolactone  25 mg Oral BID       Physical Exam:   General appearance: alert, cooperative and no distress Neck: no adenopathy, no carotid bruit, no JVD, supple, symmetrical, trachea midline and thyroid not enlarged, symmetric, no tenderness/mass/nodules Lungs: clear to auscultation bilaterally Heart: irregularly irregular rhythm and 2/6 sem lsb Abdomen: soft, non-tender; bowel sounds normal; no masses,  no organomegaly Extremities: 1+ edema Pulses: 2+ and symmetric Neurologic: Grossly normal   Rate: 63  Rhythm: atrial fibrillation   ECG (independently read by me): AF at 88 with LBBB with repolarization changes  Lab Results:   Recent Labs  06/10/14 0437 06/11/14 0520  NA 128* 128*  K 4.3 4.6  CL 92* 92*  CO2 30 31  GLUCOSE 96 97  BUN 44* 43*  CREATININE 1.51* 1.36*   CBC Latest Ref Rng 06/11/2014 06/10/2014 06/09/2014  WBC 4.0 - 10.5 K/uL 7.1 6.6 6.7    Hemoglobin 12.0 - 15.0 g/dL 13.9 13.7 13.6  Hematocrit 36.0 - 46.0 % 42.1 41.1 40.6  Platelets 150 - 400 K/uL 193 216 213      Recent Labs  06/08/14 1225  TROPONINI 0.07*    Hepatic Function Panel No results for input(s): PROT, ALBUMIN, AST, ALT, ALKPHOS, BILITOT, BILIDIR, IBILI in the last 72 hours. No results for input(s): INR in the last 72 hours. BNP (last 3 results) No results for input(s): PROBNP in the last 8760 hours.  Lipid Panel     Component Value Date/Time   CHOL 190 06/28/2010 1054   TRIG 71.0 06/28/2010 1054   HDL 84.10 06/28/2010 1054   CHOLHDL 2 06/28/2010 1054   VLDL 14.2 06/28/2010 1054   LDLCALC 92 06/28/2010 1054      Imaging:  No results found. Echo Study Conclusions  - Left ventricle: The cavity size was normal. Wall thickness was increased in a pattern of moderate to severe LVH. Systolic function was normal. The estimated ejection fraction was in the range of 50% to 55%. Wall motion was normal; there were no regional wall motion abnormalities. - Ventricular septum: Septal motion showed abnormal function and dyssynergy. These changes are consistent with a left bundle branch block. - Aortic valve: There was mild to moderate regurgitation. - Mitral valve: There was moderate to severe regurgitation. - Left atrium: The  atrium was massively dilated. - Right atrium: The atrium was massively dilated. - Tricuspid valve: There was severe regurgitation. - Pulmonary arteries: Systolic pressure was moderately to severely increased. PA peak pressure: 64 mm Hg (S).   Assessment/Plan:   Principal Problem:   Hypertensive urgency Active Problems:   Essential hypertension   Acute on chronic diastolic CHF (congestive heart failure)   Pain in the chest   Hyponatremia   1. Atypical chest pain- Resolved. Troponins are 0.06 and 0.07  Doubt ACS. Patient's electrocardiogram shows left bundle branch block which is unchanged.  2.  Permanent  atrial fibrillation-patient's heart rate is controlled.  HR controlled now with Toprol XL 37.5 mg and Cardizem 120 mg.  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   She understands the higher risk of stroke with aspirin compared to Coumadin. 3. Hypertension: 162/77 earlier 4. acute on chronic diastolic congestive heart failure:  Excellent diuresis. Will check BNP today. 5. Renal insufficiency: Stage 3 with GFR 35 today; improved with reduced lisinopril dose 6. AR: mild to moderate 7. MR:  Moderate to severe 8. Severe pulmonary HTN with est PA pp 64 mmHg. 9. Hyponatremia stable but remains low at 128.   [name] Sine, MD, [facility] 06/11/2014, 11:52 AM

## 2014-06-12 DIAGNOSIS — R7989 Other specified abnormal findings of blood chemistry: Secondary | ICD-10-CM

## 2014-06-12 DIAGNOSIS — I482 Chronic atrial fibrillation: Secondary | ICD-10-CM

## 2014-06-12 LAB — BASIC METABOLIC PANEL
Anion gap: 8 (ref 5–15)
BUN: 43 mg/dL — AB (ref 6–23)
CO2: 30 mmol/L (ref 19–32)
Calcium: 9.1 mg/dL (ref 8.4–10.5)
Chloride: 91 mEq/L — ABNORMAL LOW (ref 96–112)
Creatinine, Ser: 1.43 mg/dL — ABNORMAL HIGH (ref 0.50–1.10)
GFR calc Af Amer: 38 mL/min — ABNORMAL LOW (ref >90)
GFR calc non Af Amer: 33 mL/min — ABNORMAL LOW (ref >90)
GLUCOSE: 111 mg/dL — AB (ref 70–99)
POTASSIUM: 4.5 mmol/L (ref 3.5–5.1)
Sodium: 129 mmol/L — ABNORMAL LOW (ref 135–145)

## 2014-06-12 LAB — CBC
HEMATOCRIT: 40.3 % (ref 36.0–46.0)
HEMOGLOBIN: 13.4 g/dL (ref 12.0–15.0)
MCH: 30.2 pg (ref 26.0–34.0)
MCHC: 33.3 g/dL (ref 30.0–36.0)
MCV: 91 fL (ref 78.0–100.0)
Platelets: 193 10*3/uL (ref 150–400)
RBC: 4.43 MIL/uL (ref 3.87–5.11)
RDW: 14.3 % (ref 11.5–15.5)
WBC: 8.1 10*3/uL (ref 4.0–10.5)

## 2014-06-12 MED ORDER — SPIRONOLACTONE 12.5 MG HALF TABLET
12.5000 mg | ORAL_TABLET | Freq: Two times a day (BID) | ORAL | Status: DC
  Administered 2014-06-12 – 2014-06-14 (×5): 12.5 mg via ORAL
  Filled 2014-06-12 (×7): qty 1

## 2014-06-12 NOTE — Progress Notes (Signed)
Subjective:   Breathing better; chest pain resovled; feels well  Objective:   Vital Signs in the last 24 hours: Temp:  [97.6 F (36.4 C)-97.8 F (36.6 C)] 97.8 F (36.6 C) (12/30 2039) Pulse Rate:  [55-58] 58 (12/30 2039) Resp:  [18-20] 20 (12/30 2039) BP: (131-132)/(65-68) 132/65 mmHg (12/30 2039) SpO2:  [94 %-95 %] 95 % (12/30 2039) Weight:  [105 lb 13.1 oz (48 kg)] 105 lb 13.1 oz (48 kg) (12/31 0612)   Weight 113 --> 109 --> 105   Intake/Output from previous day: 12/30 0701 - 12/31 0700 In: 2890 [P.O.:2890] Out: 1400 [Urine:1400]  Net: +5681  I/O since admission: -4216  Medications: . aspirin  81 mg Oral Daily  . bumetanide  0.5 mg Intravenous Daily  . diltiazem  120 mg Oral Daily  . heparin subcutaneous  5,000 Units Subcutaneous 3 times per day  . latanoprost  1 drop Both Eyes Daily  . levothyroxine  50 mcg Oral QAC breakfast  . lisinopril  2.5 mg Oral Daily  . metoprolol succinate  37.5 mg Oral Daily  . sodium chloride  3 mL Intravenous Q12H  . sodium chloride  3 mL Intravenous Q12H  . spironolactone  25 mg Oral BID       Physical Exam:   General appearance: alert, cooperative and no distress Neck: no adenopathy, no carotid bruit, no JVD, supple, symmetrical, trachea midline and thyroid not enlarged, symmetric, no tenderness/mass/nodules Lungs: clear to auscultation bilaterally Heart: irregularly irregular rhythm and 2/6 sem lsb Abdomen: soft, non-tender; bowel sounds normal; no masses,  no organomegaly Extremities: 1+ edema Pulses: 2+ and symmetric Neurologic: Grossly normal   Rate: 74  Rhythm: atrial fibrillation   ECG (independently read by me): AF at 88 with LBBB with repolarization changes  Lab Results:   BMP Latest Ref Rng 06/12/2014 06/11/2014 06/10/2014  Glucose 70 - 99 mg/dL 111(H) 97 96  BUN 6 - 23 mg/dL 43(H) 43(H) 44(H)  Creatinine 0.50 - 1.10 mg/dL 1.43(H) 1.36(H) 1.51(H)  Sodium 135 - 145 mmol/L 129(L) 128(L) 128(L)    Potassium 3.5 - 5.1 mmol/L 4.5 4.6 4.3  Chloride 96 - 112 mEq/L 91(L) 92(L) 92(L)  CO2 19 - 32 mmol/L 30 31 30   Calcium 8.4 - 10.5 mg/dL 9.1 9.2 9.0     CBC Latest Ref Rng 06/12/2014 06/11/2014 06/10/2014  WBC 4.0 - 10.5 K/uL 8.1 7.1 6.6  Hemoglobin 12.0 - 15.0 g/dL 13.4 13.9 13.7  Hematocrit 36.0 - 46.0 % 40.3 42.1 41.1  Platelets 150 - 400 K/uL 193 193 216    BNP (not pro-BNP) 715 from 12/29    Lipid Panel     Component Value Date/Time   CHOL 190 06/28/2010 1054   TRIG 71.0 06/28/2010 1054   HDL 84.10 06/28/2010 1054   CHOLHDL 2 06/28/2010 1054   VLDL 14.2 06/28/2010 1054   LDLCALC 92 06/28/2010 1054      Imaging:  No results found. Echo Study Conclusions  - Left ventricle: The cavity size was normal. Wall thickness was increased in a pattern of moderate to severe LVH. Systolic function was normal. The estimated ejection fraction was in the range of 50% to 55%. Wall motion was normal; there were no regional wall motion abnormalities. - Ventricular septum: Septal motion showed abnormal function and dyssynergy. These changes are consistent with a left bundle branch block. - Aortic valve: There was mild to moderate regurgitation. - Mitral valve: There was moderate to severe regurgitation. - Left atrium: The atrium  was massively dilated. - Right atrium: The atrium was massively dilated. - Tricuspid valve: There was severe regurgitation. - Pulmonary arteries: Systolic pressure was moderately to severely increased. PA peak pressure: 64 mm Hg (S).   Assessment/Plan:   Principal Problem:   Hypertensive urgency Active Problems:   Essential hypertension   Acute on chronic diastolic CHF (congestive heart failure)   Pain in the chest   Hyponatremia   Elevated troponin   Chronic a-fib   1. Atypical chest pain- Resolved. Troponins are 0.06 and 0.07  Doubt ACS. Patient's electrocardiogram shows left bundle branch block which is unchanged.  2.  Permanent atrial fibrillation-patient's heart rate is controlled.  HR controlled now with Toprol XL 37.5 mg and Cardizem 120 mg.  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   She understands the higher risk of stroke with aspirin compared to Coumadin. 3. Hypertension: improved today at 132/65 4. Acute on chronic diastolic congestive heart failure:  Excellent diuresis. Will check BNP today. 5. Renal insufficiency: Cr 1.43 today Stage 3 with GFR 33 today;  Will decrease spironolactone to 12.5 mg bid from 25 mg bid. 6. AR: mild to moderate 7. MR:  Moderate to severe 8. Severe pulmonary HTN with est PA pp 64 mmHg. 9. Hyponatremia stable but remains low at 129, but slightly improved.   Troy Sine, MD, [facility] 06/12/2014, 7:30 AM

## 2014-06-12 NOTE — Progress Notes (Signed)
[facility] PROGRESS NOTE  Nakenya Theall JHE:[id number] DOB: 01-Feb-1930 DOA: 06/07/2014 PCP: Lilian Coma, MD  Assessment/Plan: #1 malignant hypertension Likely secondary to volume overload. Clinical improvement. Systolic blood pressure now in the 130s. Continue current regimen of metoprolol, lower dose of lisinopril, Cardizem. Cardiology following and appreciate input and recommendations.  #2 acute on chronic diastolic CHF exacerbation Unknown etiology. Troponins were slightly elevated however per cardiology not secondary to acute coronary syndrome. Patient with good diuresis on IV Bumex. Patient is -3.78 L during this hospitalization. Continue current regimen of IV Bumex, aspirin, Cardizem, lisinopril, Toprol-XL, Aldactone. Per cardiology.  #3 elevated troponins Per cardiology doubt ACS. Continue current medical management. Per cardiology.  #4 hyponatremia Likely secondary to volume overload secondary to acute on chronic diastolic CHF exacerbation. Improving slowly with diuresis. Continue diuretics. Currently sodium levels at 129. Fluid restriction. Follow.  #5 chronic atrial fibrillation Currently rate controlled on Cardizem and Toprol-XL. Patient has declined anticoagulation in the past secondary to being a Jehovah's Witness. Aspirin. Per cardiology.  #6 chronic kidney disease stage III Stable. Creatinine has improved with decreased dose of ACE inhibitor. Aldactone dose has been decreased by cardiology. Follow.  #7 mild to moderate aortic regurgitation/mild to moderate mitral regurgitation Stable. Continue current cardiac medications. Per cardiology.  #8 severe pulmonary hypertension  #9 prophylaxis Heparin for DVT prophylaxis.  Code Status: Full Family Communication: Updated patient and sister at bedside. Disposition Plan: Home when medically stable.   Consultants:  Cardiology: Dr. Stanford Breed 06/08/2014  Procedures:  2-D echo 06/08/2014  Chest x-ray  06/07/2014  Antibiotics:  None  HPI/Subjective: Patient denies any chest pain. Patient states shortness of breath has improved. Patient wondering when she can go home.  Objective: Filed Vitals:   06/12/14 1414  BP: 128/67  Pulse: 52  Temp: 98.1 F (36.7 C)  Resp: 16    Intake/Output Summary (Last 24 hours) at 06/12/14 1519 Last data filed at 06/12/14 1300  Gross per 24 hour  Intake   3490 ml  Output   1750 ml  Net   1740 ml   Filed Weights   06/10/14 0500 06/11/14 0545 06/12/14 0612  Weight: 49.5 kg (109 lb 2 oz) 49.487 kg (109 lb 1.6 oz) 48 kg (105 lb 13.1 oz)    Exam:   General:  NAD  Cardiovascular: Irregularly irregular with a 2/6 systolic ejection murmur.  Respiratory: CTAB  Abdomen: Soft, nontender, nondistended, positive bowel sounds.  Musculoskeletal: No clubbing or cyanosis. 2-3+ bilateral lower extremity edema.  Data Reviewed: Basic Metabolic Panel:  Recent Labs Lab 06/08/14 0144 06/09/14 0513 06/10/14 0437 06/11/14 0520 06/12/14 0424  NA 127* 125* 128* 128* 129*  K 4.5 3.9 4.3 4.6 4.5  CL 92* 92* 92* 92* 91*  CO2 26 29 30 31 30   GLUCOSE 133* 95 96 97 111*  BUN 36* 40* 44* 43* 43*  CREATININE 1.21* 1.16* 1.51* 1.36* 1.43*  CALCIUM 9.4 8.5 9.0 9.2 9.1   Liver Function Tests: No results for input(s): AST, ALT, ALKPHOS, BILITOT, PROT, ALBUMIN in the last 168 hours. No results for input(s): LIPASE, AMYLASE in the last 168 hours. No results for input(s): AMMONIA in the last 168 hours. CBC:  Recent Labs Lab 06/07/14 2207 06/08/14 0144 06/09/14 0513 06/10/14 0437 06/11/14 0520 06/12/14 0424  WBC 7.1 7.6 6.7 6.6 7.1 8.1  NEUTROABS 6.0  --   --   --   --   --   HGB 14.8 15.2* 13.6 13.7 13.9 13.4  HCT 45.1 45.0 40.6  41.1 42.1 40.3  MCV 90.7 89.8 89.0 90.1 90.9 91.0  PLT 268 230 213 216 193 193   Cardiac Enzymes:  Recent Labs Lab 06/08/14 0045 06/08/14 0530 06/08/14 1225  TROPONINI 0.06* 0.06* 0.07*   BNP (last 3 results) No  results for input(s): PROBNP in the last 8760 hours. CBG: No results for input(s): GLUCAP in the last 168 hours.  No results found for this or any previous visit (from the past 240 hour(s)).   Studies: No results found.  Scheduled Meds: . aspirin  81 mg Oral Daily  . bumetanide  0.5 mg Intravenous Daily  . diltiazem  120 mg Oral Daily  . heparin subcutaneous  5,000 Units Subcutaneous 3 times per day  . latanoprost  1 drop Both Eyes Daily  . levothyroxine  50 mcg Oral QAC breakfast  . lisinopril  2.5 mg Oral Daily  . metoprolol succinate  37.5 mg Oral Daily  . sodium chloride  3 mL Intravenous Q12H  . sodium chloride  3 mL Intravenous Q12H  . spironolactone  12.5 mg Oral BID   Continuous Infusions:   Principal Problem:   Hypertensive urgency Active Problems:   Essential hypertension   Acute on chronic diastolic CHF (congestive heart failure)   Pain in the chest   Hyponatremia   Elevated troponin   Chronic a-fib    Time spent: 35 mins    [facility] MD [facility] Pager [phone]. If 7PM-7AM, please contact night-coverage at [email or web address], password [facility] 06/12/2014, 3:19 PM  LOS: 5 days

## 2014-06-13 LAB — BASIC METABOLIC PANEL
Anion gap: 8 (ref 5–15)
BUN: 49 mg/dL — AB (ref 6–23)
CALCIUM: 9.1 mg/dL (ref 8.4–10.5)
CHLORIDE: 93 meq/L — AB (ref 96–112)
CO2: 27 mmol/L (ref 19–32)
CREATININE: 1.39 mg/dL — AB (ref 0.50–1.10)
GFR calc Af Amer: 40 mL/min — ABNORMAL LOW (ref >90)
GFR calc non Af Amer: 34 mL/min — ABNORMAL LOW (ref >90)
GLUCOSE: 91 mg/dL (ref 70–99)
Potassium: 4.3 mmol/L (ref 3.5–5.1)
Sodium: 128 mmol/L — ABNORMAL LOW (ref 135–145)

## 2014-06-13 LAB — CBC
HEMATOCRIT: 38.7 % (ref 36.0–46.0)
HEMOGLOBIN: 12.5 g/dL (ref 12.0–15.0)
MCH: 29.5 pg (ref 26.0–34.0)
MCHC: 32.3 g/dL (ref 30.0–36.0)
MCV: 91.3 fL (ref 78.0–100.0)
Platelets: 210 10*3/uL (ref 150–400)
RBC: 4.24 MIL/uL (ref 3.87–5.11)
RDW: 14.3 % (ref 11.5–15.5)
WBC: 6.1 10*3/uL (ref 4.0–10.5)

## 2014-06-13 LAB — BRAIN NATRIURETIC PEPTIDE: B Natriuretic Peptide: 506.5 pg/mL — ABNORMAL HIGH (ref 0.0–100.0)

## 2014-06-13 LAB — MAGNESIUM: Magnesium: 1.6 mg/dL (ref 1.5–2.5)

## 2014-06-13 MED ORDER — BUMETANIDE 1 MG PO TABS
1.0000 mg | ORAL_TABLET | Freq: Every day | ORAL | Status: DC
  Administered 2014-06-13 – 2014-06-14 (×2): 1 mg via ORAL
  Filled 2014-06-13 (×2): qty 1

## 2014-06-13 MED ORDER — MAGNESIUM SULFATE 2 GM/50ML IV SOLN
2.0000 g | Freq: Once | INTRAVENOUS | Status: AC
  Administered 2014-06-13: 2 g via INTRAVENOUS
  Filled 2014-06-13: qty 50

## 2014-06-13 NOTE — Progress Notes (Signed)
Patient Name: [name] [name] Date of Encounter: 06/13/2014     Principal Problem:   Hypertensive urgency Active Problems:   Essential hypertension   Acute on chronic diastolic CHF (congestive heart failure)   Pain in the chest   Hyponatremia   Elevated troponin   Chronic a-fib    SUBJECTIVE  Patient is feeling better this am. Less dyspneic. Still has mild-moderate edema.  CURRENT MEDS . aspirin  81 mg Oral Daily  . bumetanide  1 mg Oral Daily  . diltiazem  120 mg Oral Daily  . heparin subcutaneous  5,000 Units Subcutaneous 3 times per day  . latanoprost  1 drop Both Eyes Daily  . levothyroxine  50 mcg Oral QAC breakfast  . lisinopril  2.5 mg Oral Daily  . metoprolol succinate  37.5 mg Oral Daily  . sodium chloride  3 mL Intravenous Q12H  . sodium chloride  3 mL Intravenous Q12H  . spironolactone  12.5 mg Oral BID    OBJECTIVE  Filed Vitals:   06/11/14 2039 06/12/14 0612 06/12/14 1414 06/13/14 0541  BP: 132/65  128/67 153/81  Pulse: 58  52 78  Temp: 97.8 F (36.6 C)  98.1 F (36.7 C) 97.9 F (36.6 C)  TempSrc: Oral  Oral Oral  Resp: 20  16 20   Height:      Weight:  105 lb 13.1 oz (48 kg)  108 lb 7.5 oz (49.2 kg)  SpO2: 95%  97% 96%    Intake/Output Summary (Last 24 hours) at 06/13/14 0829 Last data filed at 06/13/14 0500  Gross per 24 hour  Intake   1560 ml  Output   1600 ml  Net    -40 ml   Filed Weights   06/11/14 0545 06/12/14 0612 06/13/14 0541  Weight: 109 lb 1.6 oz (49.487 kg) 105 lb 13.1 oz (48 kg) 108 lb 7.5 oz (49.2 kg)    PHYSICAL EXAM  General: Pleasant, NAD. Neuro: Alert and oriented X 3. Moves all extremities spontaneously. Psych: Normal affect. HEENT:  Normal  Neck: Supple without bruits or JVD. Lungs:  Resp regular and unlabored, CTA. Heart: Irregular rhythm. No S3. Grade 2/6 systolic murmur at apex. Abdomen: Soft, non-tender, non-distended, BS + x 4.  Extremities: No clubbing, cyanosis.  Mild-moderate edema. DP/PT/Radials 2+  and equal bilaterally.  Accessory Clinical Findings  CBC  Recent Labs  06/12/14 0424 06/13/14 0456  WBC 8.1 6.1  HGB 13.4 12.5  HCT 40.3 38.7  MCV 91.0 91.3  PLT 193 294   Basic Metabolic Panel  Recent Labs  06/12/14 0424 06/13/14 0456  NA 129* 128*  K 4.5 4.3  CL 91* 93*  CO2 30 27  GLUCOSE 111* 91  BUN 43* 49*  CREATININE 1.43* 1.39*  CALCIUM 9.1 9.1  MG  --  1.6   Liver Function Tests No results for input(s): AST, ALT, ALKPHOS, BILITOT, PROT, ALBUMIN in the last 72 hours. No results for input(s): LIPASE, AMYLASE in the last 72 hours. Cardiac Enzymes No results for input(s): CKTOTAL, CKMB, CKMBINDEX, TROPONINI in the last 72 hours. BNP Invalid input(s): POCBNP D-Dimer No results for input(s): DDIMER in the last 72 hours. Hemoglobin A1C No results for input(s): HGBA1C in the last 72 hours. Fasting Lipid Panel No results for input(s): CHOL, HDL, LDLCALC, TRIG, CHOLHDL, LDLDIRECT in the last 72 hours. Thyroid Function Tests No results for input(s): TSH, T4TOTAL, T3FREE, THYROIDAB in the last 72 hours.  Invalid input(s): FREET3  TELE  Atrial fibrillation with controlled  VR  ECG    Radiology/Studies  Dg Chest 2 View (if Patient Has Fever And/or Copd)  06/07/2014   CLINICAL DATA:  Left lower chest pain  EXAM: CHEST  2 VIEW  COMPARISON:  09/06/2013  FINDINGS: Cardiac shadow remains enlarged. The lungs are well aerated bilaterally. No focal infiltrate or sizable effusion is seen. No acute bony abnormality is seen.  IMPRESSION: Stable cardiomegaly.  No acute abnormality is noted.   Electronically Signed   By: Inez Catalina M.D.   On: 06/07/2014 18:47    ASSESSMENT AND PLAN 1. Atypical chest pain- Resolved.   2. Permanent atrial fibrillation-patient's heart rate is controlled. HR controlled now with Toprol XL 37.5 mg and Cardizem 120 mg. 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 She understands the higher risk of stroke with aspirin compared to Coumadin. 3. Hypertension: stable 4. Acute on chronic diastolic congestive heart failure:Switch to oral bumex today. 5. Renal insufficiency: Stable 6. AR: mild to moderate 7. MR: Moderate to severe 8. Severe pulmonary HTN with est PA pp 64 mmHg. 9. Hyponatremia stable but remains low at 128. Continue to follow.   Signed, Darlin Coco MD

## 2014-06-13 NOTE — Progress Notes (Signed)
[facility] PROGRESS NOTE  [name] [name] PJA:[id number] DOB: 02/10/1930 DOA: 06/07/2014 PCP: Lilian Coma, MD  Assessment/Plan: #1 malignant hypertension Likely secondary to volume overload. Clinical improvement. Systolic blood pressure now in the 130s. Continue current regimen of metoprolol, lower dose of lisinopril, Cardizem, bumex. Cardiology following and appreciate input and recommendations.  #2 acute on chronic diastolic CHF exacerbation Unknown etiology. Troponins were slightly elevated however per cardiology not secondary to acute coronary syndrome. Patient with good diuresis on IV Bumex. Patient is -4.176 L during this hospitalization. Continue current regimen of  aspirin, Cardizem, lisinopril, Toprol-XL, Aldactone. IV Bumex has been changed to oral Bumex. Per cardiology.  #3 elevated troponins Per cardiology doubt ACS. Continue current medical management. Per cardiology.  #4 hyponatremia Likely secondary to volume overload secondary to acute on chronic diastolic CHF exacerbation. Improving slowly with diuresis. Continue diuretics. Currently sodium levels at 128. Fluid restriction. Follow.  #5 chronic atrial fibrillation Currently rate controlled on Cardizem and Toprol-XL. Patient has declined anticoagulation in the past secondary to being a Jehovah's Witness. Aspirin. Per cardiology.  #6 chronic kidney disease stage III Stable. Creatinine has improved with decreased dose of ACE inhibitor. Aldactone dose has been decreased by cardiology. Follow.  #7 mild to moderate aortic regurgitation/mild to moderate mitral regurgitation Stable. Continue current cardiac medications. Per cardiology.  #8 severe pulmonary hypertension  #9 prophylaxis Heparin for DVT prophylaxis.  Code Status: Full Family Communication: Updated patient and sister at bedside. Disposition Plan: Home when medically stable.   Consultants:  Cardiology: Dr. Stanford Breed  06/08/2014  Procedures:  2-D echo 06/08/2014  Chest x-ray 06/07/2014  Antibiotics:  None  HPI/Subjective: Patient denies any chest pain. Patient states shortness of breath has improved. Patient wondering when she can go home.  Objective: Filed Vitals:   06/13/14 1500  BP: 130/53  Pulse: 58  Temp: 98 F (36.7 C)  Resp: 18    Intake/Output Summary (Last 24 hours) at 06/13/14 1958 Last data filed at 06/13/14 1820  Gross per 24 hour  Intake   1320 ml  Output   1950 ml  Net   -630 ml   Filed Weights   06/11/14 0545 06/12/14 0612 06/13/14 0541  Weight: 49.487 kg (109 lb 1.6 oz) 48 kg (105 lb 13.1 oz) 49.2 kg (108 lb 7.5 oz)    Exam:   General:  NAD  Cardiovascular: Irregularly irregular with a 2/6 systolic ejection murmur.  Respiratory: CTAB  Abdomen: Soft, nontender, nondistended, positive bowel sounds.  Musculoskeletal: No clubbing or cyanosis. 2-3+ bilateral lower extremity edema.  Data Reviewed: Basic Metabolic Panel:  Recent Labs Lab 06/09/14 0513 06/10/14 0437 06/11/14 0520 06/12/14 0424 06/13/14 0456  NA 125* 128* 128* 129* 128*  K 3.9 4.3 4.6 4.5 4.3  CL 92* 92* 92* 91* 93*  CO2 29 30 31 30 27   GLUCOSE 95 96 97 111* 91  BUN 40* 44* 43* 43* 49*  CREATININE 1.16* 1.51* 1.36* 1.43* 1.39*  CALCIUM 8.5 9.0 9.2 9.1 9.1  MG  --   --   --   --  1.6   Liver Function Tests: No results for input(s): AST, ALT, ALKPHOS, BILITOT, PROT, ALBUMIN in the last 168 hours. No results for input(s): LIPASE, AMYLASE in the last 168 hours. No results for input(s): AMMONIA in the last 168 hours. CBC:  Recent Labs Lab 06/07/14 2207  06/09/14 0513 06/10/14 0437 06/11/14 0520 06/12/14 0424 06/13/14 0456  WBC 7.1  < > 6.7 6.6 7.1 8.1 6.1  NEUTROABS 6.0  --   --   --   --   --   --  HGB 14.8  < > 13.6 13.7 13.9 13.4 12.5  HCT 45.1  < > 40.6 41.1 42.1 40.3 38.7  MCV 90.7  < > 89.0 90.1 90.9 91.0 91.3  PLT 268  < > 213 216 193 193 210  < > = values in this  interval not displayed. Cardiac Enzymes:  Recent Labs Lab 06/08/14 0045 06/08/14 0530 06/08/14 1225  TROPONINI 0.06* 0.06* 0.07*   BNP (last 3 results) No results for input(s): PROBNP in the last 8760 hours. CBG: No results for input(s): GLUCAP in the last 168 hours.  No results found for this or any previous visit (from the past 240 hour(s)).   Studies: No results found.  Scheduled Meds: . aspirin  81 mg Oral Daily  . bumetanide  1 mg Oral Daily  . diltiazem  120 mg Oral Daily  . heparin subcutaneous  5,000 Units Subcutaneous 3 times per day  . latanoprost  1 drop Both Eyes Daily  . levothyroxine  50 mcg Oral QAC breakfast  . lisinopril  2.5 mg Oral Daily  . metoprolol succinate  37.5 mg Oral Daily  . sodium chloride  3 mL Intravenous Q12H  . spironolactone  12.5 mg Oral BID   Continuous Infusions:   Principal Problem:   Acute on chronic diastolic CHF (congestive heart failure) Active Problems:   Essential hypertension   Hypertensive urgency   Pain in the chest   Hyponatremia   Elevated troponin   Chronic a-fib    Time spent: 35 mins    [facility] MD [facility] Pager [phone]. If 7PM-7AM, please contact night-coverage at [email or web address], password [facility] 06/13/2014, 7:58 PM  LOS: 6 days

## 2014-06-14 LAB — BASIC METABOLIC PANEL
ANION GAP: 8 (ref 5–15)
BUN: 52 mg/dL — ABNORMAL HIGH (ref 6–23)
CHLORIDE: 92 meq/L — AB (ref 96–112)
CO2: 30 mmol/L (ref 19–32)
CREATININE: 1.63 mg/dL — AB (ref 0.50–1.10)
Calcium: 9.1 mg/dL (ref 8.4–10.5)
GFR calc Af Amer: 33 mL/min — ABNORMAL LOW (ref >90)
GFR calc non Af Amer: 28 mL/min — ABNORMAL LOW (ref >90)
Glucose, Bld: 90 mg/dL (ref 70–99)
Potassium: 4.6 mmol/L (ref 3.5–5.1)
SODIUM: 130 mmol/L — AB (ref 135–145)

## 2014-06-14 LAB — CBC
HCT: 40.4 % (ref 36.0–46.0)
HEMOGLOBIN: 13.2 g/dL (ref 12.0–15.0)
MCH: 29.9 pg (ref 26.0–34.0)
MCHC: 32.7 g/dL (ref 30.0–36.0)
MCV: 91.6 fL (ref 78.0–100.0)
PLATELETS: 192 10*3/uL (ref 150–400)
RBC: 4.41 MIL/uL (ref 3.87–5.11)
RDW: 14.4 % (ref 11.5–15.5)
WBC: 7.4 10*3/uL (ref 4.0–10.5)

## 2014-06-14 LAB — BRAIN NATRIURETIC PEPTIDE: B NATRIURETIC PEPTIDE 5: 428.8 pg/mL — AB (ref 0.0–100.0)

## 2014-06-14 MED ORDER — BUMETANIDE 1 MG PO TABS: 1.0000 mg | ORAL_TABLET | Freq: Every day | ORAL | Status: DC

## 2014-06-14 MED ORDER — SPIRONOLACTONE 12.5 MG HALF TABLET: 12.5000 mg | ORAL_TABLET | Freq: Two times a day (BID) | ORAL | Status: DC

## 2014-06-14 MED ORDER — LISINOPRIL 2.5 MG PO TABS
2.5000 mg | ORAL_TABLET | Freq: Every day | ORAL | Status: DC
Start: 2014-06-14 — End: 2014-06-24

## 2014-06-14 MED ORDER — METOPROLOL SUCCINATE ER 25 MG PO TB24: 37.5000 mg | ORAL_TABLET | Freq: Every day | ORAL | Status: DC

## 2014-06-14 MED ORDER — DILTIAZEM HCL ER COATED BEADS 120 MG PO TB24: 120.0000 mg | ORAL_TABLET | Freq: Every day | ORAL | Status: DC

## 2014-06-14 NOTE — Progress Notes (Signed)
Patient Name: [name] [name] Date of Encounter: 06/14/2014     Principal Problem:   Acute on chronic diastolic CHF (congestive heart failure) Active Problems:   Essential hypertension   Hypertensive urgency   Pain in the chest   Hyponatremia   Elevated troponin   Chronic a-fib    SUBJECTIVE  81 yo with hx of actue on chronic diastolic CHF.   Chronic atrial fib  Patient is feeling better this am. Less dyspneic.  Was able to ambulate yesterday without excessive dyspnea.   CURRENT MEDS . aspirin  81 mg Oral Daily  . bumetanide  1 mg Oral Daily  . diltiazem  120 mg Oral Daily  . heparin subcutaneous  5,000 Units Subcutaneous 3 times per day  . latanoprost  1 drop Both Eyes Daily  . levothyroxine  50 mcg Oral QAC breakfast  . lisinopril  2.5 mg Oral Daily  . metoprolol succinate  37.5 mg Oral Daily  . sodium chloride  3 mL Intravenous Q12H  . spironolactone  12.5 mg Oral BID    OBJECTIVE  Filed Vitals:   06/13/14 0541 06/13/14 1114 06/13/14 1500 06/13/14 2103  BP: 153/81 152/77 130/53 124/67  Pulse: 78 69 58 69  Temp: 97.9 F (36.6 C)  98 F (36.7 C) 98 F (36.7 C)  TempSrc: Oral  Oral Oral  Resp: 20  18 18   Height:      Weight: 108 lb 7.5 oz (49.2 kg)   105 lb 4.8 oz (47.764 kg)  SpO2: 96%  97% 99%    Intake/Output Summary (Last 24 hours) at 06/14/14 0918 Last data filed at 06/13/14 2104  Gross per 24 hour  Intake    480 ml  Output   1200 ml  Net   -720 ml   Filed Weights   06/12/14 0612 06/13/14 0541 06/13/14 2103  Weight: 105 lb 13.1 oz (48 kg) 108 lb 7.5 oz (49.2 kg) 105 lb 4.8 oz (47.764 kg)    PHYSICAL EXAM  General: Pleasant, NAD. Neuro: Alert and oriented X 3. Moves all extremities spontaneously. Psych: Normal affect. HEENT:  Normal  Neck: Supple without bruits or JVD. Lungs:  Resp regular and unlabored, CTA. Heart: Irregular rhythm. No S3. Grade 2/6 systolic murmur at apex. Abdomen: Soft, non-tender, non-distended, BS + x 4.    Extremities: No clubbing, cyanosis.  Mild  edema. DP/PT/Radials 2+ and equal bilaterally.  Accessory Clinical Findings  CBC  Recent Labs  06/13/14 0456 06/14/14 0419  WBC 6.1 7.4  HGB 12.5 13.2  HCT 38.7 40.4  MCV 91.3 91.6  PLT 210 295   Basic Metabolic Panel  Recent Labs  06/13/14 0456 06/14/14 0419  NA 128* 130*  K 4.3 4.6  CL 93* 92*  CO2 27 30  GLUCOSE 91 90  BUN 49* 52*  CREATININE 1.39* 1.63*  CALCIUM 9.1 9.1  MG 1.6  --    Liver Function Tests No results for input(s): AST, ALT, ALKPHOS, BILITOT, PROT, ALBUMIN in the last 72 hours. No results for input(s): LIPASE, AMYLASE in the last 72 hours. Cardiac Enzymes No results for input(s): CKTOTAL, CKMB, CKMBINDEX, TROPONINI in the last 72 hours. BNP Invalid input(s): POCBNP D-Dimer No results for input(s): DDIMER in the last 72 hours. Hemoglobin A1C No results for input(s): HGBA1C in the last 72 hours. Fasting Lipid Panel No results for input(s): CHOL, HDL, LDLCALC, TRIG, CHOLHDL, LDLDIRECT in the last 72 hours. Thyroid Function Tests No results for input(s): TSH, T4TOTAL, T3FREE, THYROIDAB in  the last 72 hours.  Invalid input(s): FREET3  TELE  Atrial fibrillation with controlled VR  ECG    Radiology/Studies  Dg Chest 2 View (if Patient Has Fever And/or Copd)  06/07/2014   CLINICAL DATA:  Left lower chest pain  EXAM: CHEST  2 VIEW  COMPARISON:  09/06/2013  FINDINGS: Cardiac shadow remains enlarged. The lungs are well aerated bilaterally. No focal infiltrate or sizable effusion is seen. No acute bony abnormality is seen.  IMPRESSION: Stable cardiomegaly.  No acute abnormality is noted.   Electronically Signed   By: Inez Catalina M.D.   On: 06/07/2014 18:47    ASSESSMENT AND PLAN 1. Atypical chest pain- Resolved.   2. Permanent atrial fibrillation-patient's heart rate is controlled. HR controlled now with Toprol XL 37.5 mg and Cardizem 120 mg. 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 She understands the higher risk of stroke with aspirin compared to Coumadin. 3. Hypertension: stable 4. Acute on chronic diastolic congestive heart failure:Switch to oral bumex today. 5. Renal insufficiency: Stable 6. AR: mild to moderate 7. MR: Moderate to severe 8. Severe pulmonary HTN with est PA pp 64 mmHg. 9. Hyponatremia stable but remains low at 128. Continue to follow.  She appears to be close to baseline and wants to go home. Continue current meds.  I think she can be discharged today if she is able to ambulate around the ward this am Follow up with Dr. Mare Ferrari. Will sign off.    Thayer Headings, Brooke Bonito., MD, [facility] 06/14/2014, 9:24 AM [address],  [place] Pager [phone]

## 2014-06-14 NOTE — Discharge Summary (Signed)
Physician Discharge Summary  Shikara Mcauliffe QJJ:[id number] DOB: 01-03-1930 DOA: 06/07/2014  PCP: Lilian Coma, MD  Admit date: 06/07/2014 Discharge date: 06/14/2014  Time spent: 65 minutes  Recommendations for Outpatient Follow-up:  1. Follow-up with Dr. Mare Ferrari in 1 week. On follow-up basic metabolic profile need to be obtained to follow-up on electrolytes and renal function. Patient blood pressure also need to be reassessed at that time. 2. Follow-up with Lilian Coma, MD in 1 week. On follow-up basic metabolic profile need to be obtained to follow-up on electrolytes and renal function. His CBC also need to obtain to follow-up on H&H.  Discharge Diagnoses:  Principal Problem:   Acute on chronic diastolic CHF (congestive heart failure) Active Problems:   Essential hypertension   Hypertensive urgency   Pain in the chest   Hyponatremia   Elevated troponin   Chronic a-fib   Discharge Condition: Stable and improved.  Diet recommendation: Heart healthy.  Filed Weights   06/12/14 0612 06/13/14 0541 06/13/14 2103  Weight: 48 kg (105 lb 13.1 oz) 49.2 kg (108 lb 7.5 oz) 47.764 kg (105 lb 4.8 oz)    History of present illness:  [name] [name] is a 81 y.o. female who presented to the ED with c/o chest pain. Patient had a fall a week ago, hit left side on wall. Pain had gotten better; however, today she developed severe, left sided chest pain. Patient has also had 10 lb weight gain over the last month, nearly all of this is in the form of peripheral edema in her legs which are now very swollen.   Hospital Course:  #1 malignant hypertension Likely secondary to volume overload. Clinical improvement. Systolic blood pressure now in the 130s. Patient was maintained on a regimen of metoprolol, lower dose of lisinopril, Cardizem, bumex. Cardiology following and appreciate input and recommendations.  #2 acute on chronic diastolic CHF exacerbation Unknown etiology. Troponins  were slightly elevated however per cardiology not secondary to acute coronary syndrome. Patient was placed on IV diuretics with IV Bumex. Patient diuresed well during the hospitalization and subsequently transitioned to oral Bumex which she tolerated. Patient was -5.37 L during the hospitalization. Patient was maintained on regimen of aspirin, Cardizem, lisinopril, Toprol-XL, Aldactone. Per cardiology.  #3 elevated troponins Per cardiology doubt ACS. Patient was maintained on medical management. Patient was followed by cardiology. Outpatient follow-up.   #4 hyponatremia Likely secondary to volume overload secondary to acute on chronic diastolic CHF exacerbation. Improved slowly with diuresis. Continued on diuretics, and fluid restriction. On day of discharge patient's sodium was 130. Outpatient follow-up.   #5 chronic atrial fibrillation Currently rate controlled on Cardizem and Toprol-XL. Patient has declined anticoagulation in the past secondary to being a Jehovah's Witness. Aspirin. Per cardiology.  #6 chronic kidney disease stage III Stable. Patient's creatinine was noted to trend up slightly during the hospitalization and a such patient's ACE inhibitor dose was decreased as well as her Aldactone dose. Patient's renal function remained stable. Outpatient follow-up.  #7 mild to moderate aortic regurgitation/mild to moderate mitral regurgitation Stable. Continued current cardiac medications. Per cardiology.  #8 severe pulmonary hypertension Outpatient follow-up.  Procedures:  2-D echo 06/08/2014  Chest x-ray 06/07/2014    Consultations:  Cardiology: Dr. Stanford Breed 06/08/2014  Discharge Exam: Filed Vitals:   06/14/14 1429  BP: 110/56  Pulse: 66  Temp: 97.6 F (36.4 C)  Resp: 18    General: NAD Cardiovascular: RRR with 2/6 SEM Respiratory: CTAB Extremities: 2+ bilateral lower extremity edema.  Discharge Instructions  Discharge Instructions    Diet - low sodium heart  healthy    Complete by:  As directed      Discharge instructions    Complete by:  As directed   Follow up with Dr Mare Ferrari in 1-2 weeks. Follow up with Lilian Coma, MD in 1 week.     Increase activity slowly    Complete by:  As directed           Current Discharge Medication List    START taking these medications   Details  bumetanide (BUMEX) 1 MG tablet Take 1 tablet (1 mg total) by mouth daily. Qty: 30 tablet, Refills: 0    diltiazem (CARDIZEM LA) 120 MG 24 hr tablet Take 1 tablet (120 mg total) by mouth daily. Qty: 30 tablet, Refills: 0    metoprolol succinate (TOPROL-XL) 25 MG 24 hr tablet Take 1.5 tablets (37.5 mg total) by mouth daily. Qty: 60 tablet, Refills: 0      CONTINUE these medications which have CHANGED   Details  lisinopril (PRINIVIL,ZESTRIL) 2.5 MG tablet Take 1 tablet (2.5 mg total) by mouth daily. Qty: 30 tablet, Refills: 0    spironolactone (ALDACTONE) 12.5 mg TABS tablet Take 0.5 tablets (12.5 mg total) by mouth 2 (two) times daily. Qty: 62 tablet, Refills: 0      CONTINUE these medications which have NOT CHANGED   Details  aspirin 81 MG tablet Take 81 mg by mouth daily.    B Complex Vitamins (VITAMIN-B COMPLEX PO) Take by mouth.    cholecalciferol (VITAMIN D) 1000 UNITS tablet Take 1,000 Units by mouth daily.    latanoprost (XALATAN) 0.005 % ophthalmic solution Place 1 drop into both eyes daily.     levothyroxine (SYNTHROID, LEVOTHROID) 50 MCG tablet TAKE ONE TABLET BY MOUTH ONE TIME DAILY Qty: 30 tablet, Refills: 5    sodium chloride (OCEAN) 0.65 % SOLN nasal spray Place 1 spray into both nostrils at bedtime as needed for congestion.      STOP taking these medications     atenolol (TENORMIN) 25 MG tablet      digoxin (LANOXIN) 0.125 MG tablet      furosemide (LASIX) 40 MG tablet      triamterene-hydrochlorothiazide (MAXZIDE-25) 37.5-25 MG per tablet        Allergies  Allergen Reactions  . Sulfa Antibiotics   . Amlodipine      Pain in jaw, and peripheral edema  . Cozaar     insomnia  . Edecrin [Ethacrynic Acid] Nausea And Vomiting  . Lasix [Furosemide] Itching    Takes furosemide at home  . Ramipril     Dyspnea - Tolerates lisinopril  . Hctz [Hydrochlorothiazide] Rash    Takes Triamterene/HCTZ at home.   Follow-up Information    Follow up with [facility].   Why:  Nurse, physical therapy, Aide and Social worker   Contact information:   [address] [place] [address] [phone]       Follow up with Lilian Coma, MD. Schedule an appointment as soon as possible for a visit in 1 week.   Specialty:  Family Medicine   Contact information:   [address] [place] [address] [phone]       Follow up with Darlin Coco, MD. Schedule an appointment as soon as possible for a visit in 1 week.   Specialty:  Cardiology   Why:  f/u in 1 week   Contact information:   [address]  [place] [address] [phone]        The results of significant diagnostics from this hospitalization (including imaging, microbiology, ancillary and laboratory) are listed below for reference.    Significant Diagnostic Studies: Dg Chest 2 View (if Patient Has Fever And/or Copd)  06/07/2014   CLINICAL DATA:  Left lower chest pain  EXAM: CHEST  2 VIEW  COMPARISON:  09/06/2013  FINDINGS: Cardiac shadow remains enlarged. The lungs are well aerated bilaterally. No focal infiltrate or sizable effusion is seen. No acute bony abnormality is seen.  IMPRESSION: Stable cardiomegaly.  No acute abnormality is noted.   Electronically Signed   By: Inez Catalina M.D.   On: 06/07/2014 18:47    Microbiology: No results found for this or any previous visit (from the past 240 hour(s)).   Labs: Basic Metabolic Panel:  Recent Labs Lab 06/10/14 0437 06/11/14 0520 06/12/14 0424 06/13/14 0456 06/14/14 0419  NA 128* 128* 129* 128* 130*  K 4.3 4.6 4.5 4.3 4.6   CL 92* 92* 91* 93* 92*  CO2 30 31 30 27 30   GLUCOSE 96 97 111* 91 90  BUN 44* 43* 43* 49* 52*  CREATININE 1.51* 1.36* 1.43* 1.39* 1.63*  CALCIUM 9.0 9.2 9.1 9.1 9.1  MG  --   --   --  1.6  --    Liver Function Tests: No results for input(s): AST, ALT, ALKPHOS, BILITOT, PROT, ALBUMIN in the last 168 hours. No results for input(s): LIPASE, AMYLASE in the last 168 hours. No results for input(s): AMMONIA in the last 168 hours. CBC:  Recent Labs Lab 06/07/14 2207  06/10/14 0437 06/11/14 0520 06/12/14 0424 06/13/14 0456 06/14/14 0419  WBC 7.1  < > 6.6 7.1 8.1 6.1 7.4  NEUTROABS 6.0  --   --   --   --   --   --   HGB 14.8  < > 13.7 13.9 13.4 12.5 13.2  HCT 45.1  < > 41.1 42.1 40.3 38.7 40.4  MCV 90.7  < > 90.1 90.9 91.0 91.3 91.6  PLT 268  < > 216 193 193 210 192  < > = values in this interval not displayed. Cardiac Enzymes:  Recent Labs Lab 06/08/14 0045 06/08/14 0530 06/08/14 1225  TROPONINI 0.06* 0.06* 0.07*   BNP: BNP (last 3 results) No results for input(s): PROBNP in the last 8760 hours. CBG: No results for input(s): GLUCAP in the last 168 hours.     SignedIrine Seal MD [facility] 06/14/2014, 2:44 PM

## 2014-06-17 ENCOUNTER — Telehealth: Payer: Self-pay | Admitting: Cardiology

## 2014-06-17 MED ORDER — DILTIAZEM HCL ER COATED BEADS 120 MG PO CP24: 120.0000 mg | ORAL_CAPSULE | Freq: Every day | ORAL | Status: DC

## 2014-06-17 NOTE — Telephone Encounter (Signed)
Spoke with patient and she can not afford Rx sent over by hospital Spoke with pharmacist and patients Cardizem LA not available in generic Discussed with Brynda Rim PA and ok to change to Cardizem CD Called to pharmacy, advised patient

## 2014-06-17 NOTE — Telephone Encounter (Signed)
Follow up      No one has called pt.  I assured pt we had her messge and as soon as a physician tells the nurse what other medication she can take, we will cal her back.  She said ok

## 2014-06-17 NOTE — Telephone Encounter (Signed)
New Message       Pt calling stating that she was just d/c from the hospital and went to pick up her meds from the pharmacy and one of the medications she was prescribed is too expensive and she can't afford it and wants to know if she can take something else. Please call back and advise.

## 2014-06-23 ENCOUNTER — Telehealth: Payer: Self-pay | Admitting: Cardiology

## 2014-06-23 ENCOUNTER — Encounter: Payer: Self-pay | Admitting: Cardiology

## 2014-06-23 DIAGNOSIS — E871 Hypo-osmolality and hyponatremia: Secondary | ICD-10-CM

## 2014-06-23 NOTE — Telephone Encounter (Signed)
New Message        Office calling wanting to know if we can draw labs for pt repeat bmp per Dr. Cheron Schaumann when pt comes into the office on Friday. Please call back and advise.

## 2014-06-23 NOTE — Telephone Encounter (Signed)
Left message will add labs as requested

## 2014-06-24 ENCOUNTER — Encounter: Payer: Self-pay | Admitting: Cardiology

## 2014-06-24 ENCOUNTER — Telehealth: Payer: Self-pay | Admitting: *Deleted

## 2014-06-24 NOTE — Telephone Encounter (Signed)
Notes Recorded by Earvin Hansen on 06/24/2014 at 6:21 PM Patient actually not having lab recheck until 06/27/14 Notes Recorded by Earvin Hansen on 06/24/2014 at 6:20 PM Discussed with Dr. Mare Ferrari and will have patient decrease Spironolactone to 1/2 tablet every other day. Advised patient Lisinopril d/c by PCP

## 2014-06-24 NOTE — Telephone Encounter (Signed)
-----   Message from Darlin Coco, MD sent at 06/23/2014  6:10 PM EST ----- Patient is still hyponatremic.  We are rechecking a basal metabolic panel here in our office today.  Continue current medication

## 2014-06-27 ENCOUNTER — Encounter: Payer: Self-pay | Admitting: Physician Assistant

## 2014-06-27 ENCOUNTER — Ambulatory Visit (INDEPENDENT_AMBULATORY_CARE_PROVIDER_SITE_OTHER): Payer: Medicare Other | Admitting: Physician Assistant

## 2014-06-27 ENCOUNTER — Other Ambulatory Visit: Payer: Self-pay | Admitting: *Deleted

## 2014-06-27 ENCOUNTER — Other Ambulatory Visit (INDEPENDENT_AMBULATORY_CARE_PROVIDER_SITE_OTHER): Payer: Medicare Other | Admitting: *Deleted

## 2014-06-27 VITALS — BP 130/70 | HR 83 | Ht 60.0 in | Wt 103.0 lb

## 2014-06-27 DIAGNOSIS — E871 Hypo-osmolality and hyponatremia: Secondary | ICD-10-CM

## 2014-06-27 DIAGNOSIS — I270 Primary pulmonary hypertension: Secondary | ICD-10-CM

## 2014-06-27 DIAGNOSIS — I10 Essential (primary) hypertension: Secondary | ICD-10-CM

## 2014-06-27 DIAGNOSIS — I482 Chronic atrial fibrillation, unspecified: Secondary | ICD-10-CM

## 2014-06-27 DIAGNOSIS — I5033 Acute on chronic diastolic (congestive) heart failure: Secondary | ICD-10-CM

## 2014-06-27 DIAGNOSIS — I272 Pulmonary hypertension, unspecified: Secondary | ICD-10-CM

## 2014-06-27 DIAGNOSIS — Z79899 Other long term (current) drug therapy: Secondary | ICD-10-CM

## 2014-06-27 DIAGNOSIS — N183 Chronic kidney disease, stage 3 unspecified: Secondary | ICD-10-CM

## 2014-06-27 DIAGNOSIS — I340 Nonrheumatic mitral (valve) insufficiency: Secondary | ICD-10-CM | POA: Insufficient documentation

## 2014-06-27 DIAGNOSIS — I5032 Chronic diastolic (congestive) heart failure: Secondary | ICD-10-CM

## 2014-06-27 LAB — BASIC METABOLIC PANEL
BUN: 61 mg/dL — ABNORMAL HIGH (ref 6–23)
CALCIUM: 10.3 mg/dL (ref 8.4–10.5)
CO2: 29 meq/L (ref 19–32)
Chloride: 90 mEq/L — ABNORMAL LOW (ref 96–112)
Creatinine, Ser: 1.63 mg/dL — ABNORMAL HIGH (ref 0.40–1.20)
GFR: 32.04 mL/min — ABNORMAL LOW (ref >60.00)
Glucose, Bld: 83 mg/dL (ref 70–99)
POTASSIUM: 4.4 meq/L (ref 3.5–5.1)
SODIUM: 128 meq/L — AB (ref 135–145)

## 2014-06-27 NOTE — Assessment & Plan Note (Signed)
Controlled on Cardizem and metoprolol. Takes 81 mg of aspirin. She has declined anticoagulation in the past because she is a Jehovah witness.

## 2014-06-27 NOTE — Assessment & Plan Note (Signed)
Patient has significant lower extremity edema however her lungs are clear. She has no JVD.  Her weight is down a couple pounds from January 1 and has been stable at home. Continue current dose of Bumex and spironolactone (taking every other day)

## 2014-06-27 NOTE — Addendum Note (Signed)
Addended by: Eulis Foster on: 06/27/2014 10:36 AM   Modules accepted: Orders

## 2014-06-27 NOTE — Patient Instructions (Signed)
Your physician recommends that you continue on your current medications as directed. Please refer to the Current Medication list given to you today.  Lab Today: Bmet  Follow up with Dr.Brackbill as planned on 09/04/14 @ 2pm

## 2014-06-27 NOTE — Assessment & Plan Note (Addendum)
Checking a basic metabolic panel today.  Her ACE inhibitor was stopped in the hospital due to worsening kidney function.

## 2014-06-27 NOTE — Progress Notes (Signed)
Patient ID: [name] [name], female   DOB: 02-Dec-1929, 81 y.o.   MRN: [id number]    Date:  06/27/2014   ID:  [name] [name], DOB Oct 17, 1929, MRN [id number]  PCP:  Lilian Coma, MD  Primary Cardiologist:  Mare Ferrari  Chief Complaint  Patient presents with  . [facility]    EKG PERFORMED     History of Present Illness: [name] [name] is a 81 y.o. female with a history of, hypothyroidism, hypertension, diastolic heart failure, mild to moderate aortic regurgitation, moderate to severe mitral regurgitation, severe pulmonary hypertension, chronic atrial fibrillation, osteoarthritis, chronic kidney disease stage III.  She has really refused anticoagulation other than aspirin.   Patient presents today for posthospital evaluation. She reports her weight has been stable. Blood pressures running 119 to about 325 systolic. She continues to have considerable lower extremity edema however she says it is much better than it has been.   The patient currently denies nausea, vomiting, fever, chest pain, shortness of breath beyond baseline, orthopnea, dizziness, PND, cough, congestion, abdominal pain, hematochezia, melena,claudication.  Wt Readings from Last 3 Encounters:  06/27/14 103 lb (46.72 kg)  06/13/14 105 lb 4.8 oz (47.764 kg)  05/07/14 110 lb (49.896 kg)     Past Medical History  Diagnosis Date  . Mitral valve disease   . Mitral regurgitation   . Hypothyroidism   . B12 deficiency   . Heart murmur   . Allergy   . OA (osteoarthritis)   . Migraines   . Atrial fibrillation     pt declines anticoag (jehovah's witness)  . Cardiomegaly     on CT scan 03/2011    Current Outpatient Prescriptions  Medication Sig Dispense Refill  . aspirin 81 MG tablet Take 81 mg by mouth daily.    . B Complex Vitamins (VITAMIN-B COMPLEX PO) Take by mouth.    . bumetanide (BUMEX) 1 MG tablet Take 1 tablet (1 mg total) by mouth daily. 30 tablet 0  . cholecalciferol (VITAMIN D) 1000 UNITS tablet  Take 1,000 Units by mouth daily.    Marland Kitchen diltiazem (CARDIZEM CD) 120 MG 24 hr capsule Take 1 capsule (120 mg total) by mouth daily. 30 capsule 2  . latanoprost (XALATAN) 0.005 % ophthalmic solution Place 1 drop into both eyes daily.     Marland Kitchen levothyroxine (SYNTHROID, LEVOTHROID) 50 MCG tablet TAKE ONE TABLET BY MOUTH ONE TIME DAILY 30 tablet 5  . metoprolol succinate (TOPROL-XL) 25 MG 24 hr tablet Take 1.5 tablets (37.5 mg total) by mouth daily. 60 tablet 0  . sodium chloride (OCEAN) 0.65 % SOLN nasal spray Place 1 spray into both nostrils at bedtime as needed for congestion.    Marland Kitchen spironolactone (ALDACTONE) 25 MG tablet Take 25 mg by mouth as directed. 1/2 tablet every other day     No current facility-administered medications for this visit.    Allergies:    Allergies  Allergen Reactions  . Sulfa Antibiotics   . Amlodipine     Pain in jaw, and peripheral edema  . Cozaar     insomnia  . Edecrin [Ethacrynic Acid] Nausea And Vomiting  . Lasix [Furosemide] Itching    Takes furosemide at home  . Ramipril     Dyspnea - Tolerates lisinopril  . Hctz [Hydrochlorothiazide] Rash    Takes Triamterene/HCTZ at home.    Social History:  The patient  reports that she has quit smoking. She has never used smokeless tobacco. She reports that she drinks alcohol. She reports that she  does not use illicit drugs.   Family history:   Family History  Problem Relation Age of Onset  . Stroke Mother   . Arthritis Father   . Hypertension Mother   . Stomach cancer Father     ROS:  Please see the history of present illness.  All other systems reviewed and negative.   PHYSICAL EXAM: VS:  BP 130/70 mmHg  Pulse 83  Ht 5' (1.524 m)  Wt 103 lb (46.72 kg)  BMI 20.12 kg/m2 Well nourished, well developed, in no acute distress HEENT: Pupils are equal round react to light accommodation extraocular movements are intact.  Neck: no JVDNo cervical lymphadenopathy. Cardiac: Irregular rate and rhythm without murmurs  rubs or gallops. Lungs:  clear to auscultation bilaterally, no wheezing, rhonchi or rales Abd: soft, nontender, positive bowel sounds all quadrants, no hepatosplenomegaly Ext: 2+ lower extremity edema.  2+ radial and dorsalis pedis pulses. Skin: warm and dry Neuro:  Grossly normal  EKG:  Atrial fibrillation rate 83 bpm    ASSESSMENT AND PLAN:  Problem List Items Addressed This Visit    Pulmonary hypertension   Mitral regurgitation, moderate to severe    Will need to continue to follow is likely contributing to her pulmonary hypertension      Essential hypertension - Primary (Chronic)    Blood pressure controlled. She reports that at home and has been running 370-488 systolic. He is currently taking Cardizem and metoprolol      Relevant Orders   EKG 89-VQXI   Basic metabolic panel   Chronic kidney disease, stage III (moderate)    Checking a basic metabolic panel today.  Her ACE inhibitor was stopped in the hospital due to worsening kidney function.      Chronic diastolic heart failure    Patient has significant lower extremity edema however her lungs are clear. She has no JVD.  Her weight is down a couple pounds from January 1 and has been stable at home. Continue current dose of Bumex and spironolactone (taking every other day)      Chronic a-fib    Controlled on Cardizem and metoprolol. Takes 81 mg of aspirin. She has declined anticoagulation in the past because she is a Jehovah witness.      Acute on chronic diastolic CHF (congestive heart failure)    Other Visit Diagnoses    Acute on chronic diastolic heart failure        Relevant Orders    EKG 50-TUUE    Basic metabolic panel      Compression socks because she cannot put them on. Keep legs elevated as much as possible.

## 2014-06-27 NOTE — Assessment & Plan Note (Signed)
Blood pressure controlled. She reports that at home and has been running 092-330 systolic. He is currently taking Cardizem and metoprolol

## 2014-06-27 NOTE — Assessment & Plan Note (Signed)
Will need to continue to follow is likely contributing to her pulmonary hypertension

## 2014-06-30 ENCOUNTER — Telehealth: Payer: Self-pay | Admitting: Cardiology

## 2014-06-30 ENCOUNTER — Other Ambulatory Visit: Payer: Medicare Other

## 2014-06-30 NOTE — Telephone Encounter (Signed)
ERROR// Pt wanted to resch lab//sr

## 2014-07-01 ENCOUNTER — Other Ambulatory Visit: Payer: Medicare Other

## 2014-07-02 ENCOUNTER — Other Ambulatory Visit: Payer: Medicare Other

## 2014-07-09 ENCOUNTER — Telehealth: Payer: Self-pay | Admitting: Cardiology

## 2014-07-09 NOTE — Telephone Encounter (Signed)
New message     Talk to the nurse about "some things".  She would not tell me what she wanted

## 2014-07-09 NOTE — Telephone Encounter (Signed)
Patient coming tomorrow for labs Had to put off secondary to constipation issues. Saw MD who advised her to increase her water intake to 6 glasses a day, was restricted secondary to low sodium Patient will come tomorrow for labs

## 2014-07-10 ENCOUNTER — Other Ambulatory Visit (INDEPENDENT_AMBULATORY_CARE_PROVIDER_SITE_OTHER): Payer: Medicare Other | Admitting: *Deleted

## 2014-07-10 DIAGNOSIS — Z79899 Other long term (current) drug therapy: Secondary | ICD-10-CM

## 2014-07-10 LAB — BASIC METABOLIC PANEL
BUN: 43 mg/dL — AB (ref 6–23)
CHLORIDE: 88 meq/L — AB (ref 96–112)
CO2: 30 meq/L (ref 19–32)
CREATININE: 1.45 mg/dL — AB (ref 0.40–1.20)
Calcium: 9.4 mg/dL (ref 8.4–10.5)
GFR: 36.67 mL/min — ABNORMAL LOW (ref >60.00)
Glucose, Bld: 73 mg/dL (ref 70–99)
POTASSIUM: 3.6 meq/L (ref 3.5–5.1)
Sodium: 128 mEq/L — ABNORMAL LOW (ref 135–145)

## 2014-07-10 NOTE — Progress Notes (Signed)
Quick Note:  Please report to patient. The recent labs are stable. Continue same medication and careful diet. ______ 

## 2014-07-14 ENCOUNTER — Other Ambulatory Visit: Payer: Self-pay | Admitting: Cardiology

## 2014-07-15 ENCOUNTER — Observation Stay (HOSPITAL_COMMUNITY)
Admission: RE | Admit: 2014-07-15 | Discharge: 2014-07-17 | Disposition: A | Discharge status: Home / Self Care | Payer: Medicare Other | Attending: Internal Medicine | Admitting: Internal Medicine

## 2014-07-15 ENCOUNTER — Encounter (HOSPITAL_COMMUNITY): Payer: Self-pay | Admitting: Emergency Medicine

## 2014-07-15 DIAGNOSIS — E039 Hypothyroidism, unspecified: Secondary | ICD-10-CM | POA: Insufficient documentation

## 2014-07-15 DIAGNOSIS — I517 Cardiomegaly: Secondary | ICD-10-CM | POA: Diagnosis not present

## 2014-07-15 DIAGNOSIS — I129 Hypertensive chronic kidney disease with stage 1 through stage 4 chronic kidney disease, or unspecified chronic kidney disease: Secondary | ICD-10-CM | POA: Diagnosis not present

## 2014-07-15 DIAGNOSIS — I482 Chronic atrial fibrillation, unspecified: Secondary | ICD-10-CM | POA: Diagnosis present

## 2014-07-15 DIAGNOSIS — N183 Chronic kidney disease, stage 3 (moderate): Secondary | ICD-10-CM | POA: Diagnosis not present

## 2014-07-15 DIAGNOSIS — Z79899 Other long term (current) drug therapy: Secondary | ICD-10-CM | POA: Diagnosis not present

## 2014-07-15 DIAGNOSIS — R040 Epistaxis: Principal | ICD-10-CM | POA: Insufficient documentation

## 2014-07-15 DIAGNOSIS — Z87891 Personal history of nicotine dependence: Secondary | ICD-10-CM | POA: Diagnosis not present

## 2014-07-15 DIAGNOSIS — R197 Diarrhea, unspecified: Secondary | ICD-10-CM | POA: Insufficient documentation

## 2014-07-15 DIAGNOSIS — Z882 Allergy status to sulfonamides status: Secondary | ICD-10-CM | POA: Diagnosis not present

## 2014-07-15 DIAGNOSIS — G43909 Migraine, unspecified, not intractable, without status migrainosus: Secondary | ICD-10-CM | POA: Diagnosis not present

## 2014-07-15 DIAGNOSIS — I10 Essential (primary) hypertension: Secondary | ICD-10-CM

## 2014-07-15 DIAGNOSIS — M1990 Unspecified osteoarthritis, unspecified site: Secondary | ICD-10-CM | POA: Insufficient documentation

## 2014-07-15 DIAGNOSIS — I340 Nonrheumatic mitral (valve) insufficiency: Secondary | ICD-10-CM | POA: Insufficient documentation

## 2014-07-15 DIAGNOSIS — Z888 Allergy status to other drugs, medicaments and biological substances status: Secondary | ICD-10-CM | POA: Diagnosis not present

## 2014-07-15 DIAGNOSIS — I472 Ventricular tachycardia: Secondary | ICD-10-CM

## 2014-07-15 DIAGNOSIS — I447 Left bundle-branch block, unspecified: Secondary | ICD-10-CM | POA: Diagnosis not present

## 2014-07-15 DIAGNOSIS — E538 Deficiency of other specified B group vitamins: Secondary | ICD-10-CM | POA: Insufficient documentation

## 2014-07-15 DIAGNOSIS — I4729 Other ventricular tachycardia: Secondary | ICD-10-CM

## 2014-07-15 DIAGNOSIS — Z7982 Long term (current) use of aspirin: Secondary | ICD-10-CM | POA: Diagnosis not present

## 2014-07-15 LAB — BASIC METABOLIC PANEL
Anion gap: 12 (ref 5–15)
BUN: 64 mg/dL — ABNORMAL HIGH (ref 6–23)
CO2: 29 mmol/L (ref 19–32)
Calcium: 9.5 mg/dL (ref 8.4–10.5)
Chloride: 94 mmol/L — ABNORMAL LOW (ref 96–112)
Creatinine, Ser: 1.42 mg/dL — ABNORMAL HIGH (ref 0.50–1.10)
GFR calc Af Amer: 39 mL/min — ABNORMAL LOW (ref >90)
GFR calc non Af Amer: 33 mL/min — ABNORMAL LOW (ref >90)
Glucose, Bld: 124 mg/dL — ABNORMAL HIGH (ref 70–99)
POTASSIUM: 4.1 mmol/L (ref 3.5–5.1)
Sodium: 135 mmol/L (ref 135–145)

## 2014-07-15 LAB — CBC
HCT: 38.9 % (ref 36.0–46.0)
HEMOGLOBIN: 12.7 g/dL (ref 12.0–15.0)
MCH: 30.5 pg (ref 26.0–34.0)
MCHC: 32.6 g/dL (ref 30.0–36.0)
MCV: 93.3 fL (ref 78.0–100.0)
PLATELETS: 201 10*3/uL (ref 150–400)
RBC: 4.17 MIL/uL (ref 3.87–5.11)
RDW: 14.6 % (ref 11.5–15.5)
WBC: 9.2 10*3/uL (ref 4.0–10.5)

## 2014-07-15 LAB — PROTIME-INR
INR: 1.16 (ref 0.00–1.49)
PROTHROMBIN TIME: 14.9 s (ref 11.6–15.2)

## 2014-07-15 LAB — TROPONIN I: Troponin I: 0.1 ng/mL — ABNORMAL HIGH (ref <0.031)

## 2014-07-15 MED ORDER — CETYLPYRIDINIUM CHLORIDE 0.05 % MT LIQD
7.0000 mL | Freq: Two times a day (BID) | OROMUCOSAL | Status: DC
  Administered 2014-07-16 – 2014-07-17 (×3): 7 mL via OROMUCOSAL

## 2014-07-15 MED ORDER — LEVOTHYROXINE SODIUM 50 MCG PO TABS
50.0000 ug | ORAL_TABLET | Freq: Every day | ORAL | Status: DC
  Filled 2014-07-15: qty 1

## 2014-07-15 MED ORDER — HYDRALAZINE HCL 20 MG/ML IJ SOLN
10.0000 mg | INTRAMUSCULAR | Status: DC | PRN
  Administered 2014-07-15: 10 mg via INTRAVENOUS
  Filled 2014-07-15: qty 1

## 2014-07-15 MED ORDER — LATANOPROST 0.005 % OP SOLN
1.0000 [drp] | Freq: Every day | OPHTHALMIC | Status: DC
  Administered 2014-07-15 – 2014-07-17 (×3): 1 [drp] via OPHTHALMIC
  Filled 2014-07-15 (×2): qty 2.5

## 2014-07-15 MED ORDER — SODIUM CHLORIDE 0.9 % IJ SOLN
3.0000 mL | Freq: Two times a day (BID) | INTRAMUSCULAR | Status: DC
  Administered 2014-07-15 – 2014-07-17 (×3): 3 mL via INTRAVENOUS

## 2014-07-15 MED ORDER — CHLORHEXIDINE GLUCONATE 0.12 % MT SOLN
15.0000 mL | Freq: Two times a day (BID) | OROMUCOSAL | Status: DC
  Administered 2014-07-16 – 2014-07-17 (×4): 15 mL via OROMUCOSAL
  Filled 2014-07-15 (×6): qty 15

## 2014-07-15 MED ORDER — ONDANSETRON HCL 4 MG/2ML IJ SOLN
4.0000 mg | Freq: Four times a day (QID) | INTRAMUSCULAR | Status: DC | PRN
  Administered 2014-07-15: 4 mg via INTRAVENOUS
  Filled 2014-07-15: qty 2

## 2014-07-15 MED ORDER — HYDRALAZINE HCL 20 MG/ML IJ SOLN: 5.0000 mg | INTRAMUSCULAR | Status: DC | PRN

## 2014-07-15 MED ORDER — VITAMIN D3 25 MCG (1000 UNIT) PO TABS
1000.0000 [IU] | ORAL_TABLET | Freq: Every day | ORAL | Status: DC
  Administered 2014-07-15 – 2014-07-17 (×3): 1000 [IU] via ORAL
  Filled 2014-07-15 (×4): qty 1

## 2014-07-15 MED ORDER — METOPROLOL SUCCINATE ER 25 MG PO TB24
37.5000 mg | ORAL_TABLET | Freq: Every day | ORAL | Status: DC
  Administered 2014-07-16 – 2014-07-17 (×2): 37.5 mg via ORAL
  Filled 2014-07-15 (×2): qty 1

## 2014-07-15 MED ORDER — LEVOTHYROXINE SODIUM 50 MCG PO TABS
50.0000 ug | ORAL_TABLET | Freq: Every day | ORAL | Status: DC
  Administered 2014-07-15 – 2014-07-17 (×2): 50 ug via ORAL
  Filled 2014-07-15 (×4): qty 1

## 2014-07-15 MED ORDER — METOPROLOL SUCCINATE ER 25 MG PO TB24
25.0000 mg | ORAL_TABLET | Freq: Once | ORAL | Status: AC
  Administered 2014-07-15: 25 mg via ORAL
  Filled 2014-07-15: qty 1

## 2014-07-15 MED ORDER — SODIUM CHLORIDE 0.9 % IV SOLN
INTRAVENOUS | Status: DC
  Administered 2014-07-16: 01:00:00 via INTRAVENOUS

## 2014-07-15 MED ORDER — SODIUM CHLORIDE 0.9 % IV BOLUS (SEPSIS)
500.0000 mL | Freq: Once | INTRAVENOUS | Status: AC
  Administered 2014-07-16: 500 mL via INTRAVENOUS

## 2014-07-15 MED ORDER — DOCUSATE SODIUM 100 MG PO CAPS: 100.0000 mg | ORAL_CAPSULE | Freq: Every day | ORAL | Status: DC | PRN

## 2014-07-15 MED ORDER — DILTIAZEM HCL ER COATED BEADS 120 MG PO CP24
120.0000 mg | ORAL_CAPSULE | Freq: Every day | ORAL | Status: DC
  Administered 2014-07-15 – 2014-07-17 (×3): 120 mg via ORAL
  Filled 2014-07-15 (×4): qty 1

## 2014-07-15 NOTE — H&P (Addendum)
[facility] History and Physical  Zakirah Weingart GUY:[id number] DOB: 1930-01-12 DOA: 07/15/2014  Referring physician: EDP PCP: Lilian Coma, MD   Chief Complaint: Epistaxis   HPI: [name] [name] is a 81 y.o. female who presents to the ED for evaluation of L nostril nose bleed.  Heavy bleeding from L side of nose onset at 9:30 AM this morning.  She does admit she hasnt taken her BP meds today.  Holding pressure intermittently but has not stopped all day so she presented to the ED.  Does have h/o A.Fib but not on any blood thinners other than 81 ASA daily.  Review of Systems: Systems reviewed.  As above, otherwise negative  Past Medical History  Diagnosis Date  . Mitral valve disease   . Mitral regurgitation   . Hypothyroidism   . B12 deficiency   . Heart murmur   . Allergy   . OA (osteoarthritis)   . Migraines   . Atrial fibrillation     pt declines anticoag (jehovah's witness)  . Cardiomegaly     on CT scan 03/2011   Past Surgical History  Procedure Laterality Date  . Tonsillectomy    . Nasal endoscopy with epistaxis control  07/15/2012    Procedure: NASAL ENDOSCOPY WITH EPISTAXIS CONTROL;  Surgeon: Izora Gala, MD;  Location: [facility];  Service: ENT;  Laterality: N/A;   Social History:  reports that she has quit smoking. She has never used smokeless tobacco. She reports that she drinks alcohol. She reports that she does not use illicit drugs.  Allergies  Allergen Reactions  . Sulfa Antibiotics Itching  . Amlodipine     Pain in jaw, and peripheral edema  . Cozaar Other (See Comments)    insomnia  . Edecrin [Ethacrynic Acid] Nausea And Vomiting  . Lasix [Furosemide] Itching    Takes furosemide at home  . Ramipril Other (See Comments)    Dyspnea - Tolerates lisinopril  . Hctz [Hydrochlorothiazide] Rash    Takes Triamterene/HCTZ at home.    Family History  Problem Relation Age of Onset  . Stroke Mother   . Arthritis Father   . Hypertension Mother   .  Stomach cancer Father      Prior to Admission medications   Medication Sig Start Date End Date Taking? Authorizing Provider  aspirin 81 MG tablet Take 81 mg by mouth daily.   Yes Historical Provider, MD  B Complex Vitamins (VITAMIN-B COMPLEX PO) Take by mouth.   Yes Historical Provider, MD  bumetanide (BUMEX) 1 MG tablet Take 1 tablet (1 mg total) by mouth daily. 06/14/14  Yes Eugenie Filler, MD  cholecalciferol (VITAMIN D) 1000 UNITS tablet Take 1,000 Units by mouth daily.   Yes Historical Provider, MD  diltiazem (CARDIZEM CD) 120 MG 24 hr capsule Take 1 capsule (120 mg total) by mouth daily. 06/17/14  Yes Darlin Coco, MD  docusate sodium (COLACE) 100 MG capsule Take 100 mg by mouth daily as needed for mild constipation.   Yes Historical Provider, MD  latanoprost (XALATAN) 0.005 % ophthalmic solution Place 1 drop into both eyes daily.  04/16/14  Yes Historical Provider, MD  levothyroxine (SYNTHROID, LEVOTHROID) 50 MCG tablet TAKE ONE TABLET BY MOUTH ONE TIME DAILY 07/09/11  Yes Rowe Clack, MD  metoprolol succinate (TOPROL-XL) 25 MG 24 hr tablet Take 1.5 tablets (37.5 mg total) by mouth daily. 06/14/14  Yes Eugenie Filler, MD  sodium chloride (OCEAN) 0.65 % SOLN nasal spray Place 1 spray into both nostrils at  bedtime as needed for congestion.   Yes Historical Provider, MD  spironolactone (ALDACTONE) 25 MG tablet Take 12.5 mg by mouth every other day.    Yes Historical Provider, MD   Physical Exam: Filed Vitals:   07/15/14 1920  BP: 191/101  Pulse: 110  Temp: 97.9 F (36.6 C)  Resp: 18    BP 191/101 mmHg  Pulse 110  Temp(Src) 97.9 F (36.6 C) (Oral)  Resp 18  SpO2 97%  General Appearance:    Alert, oriented, no distress, appears stated age  Head:    Normocephalic, atraumatic  Eyes:    PERRL, EOMI, sclera non-icteric        Nose:   Rhino rocket in place L nostril  Throat:   Moist mucous membranes. Oropharynx without erythema or exudate.  Neck:   Supple. No carotid  bruits.  No thyromegaly.  No lymphadenopathy.   Back:     No CVA tenderness, no spinal tenderness  Lungs:     Clear to auscultation bilaterally, without wheezes, rhonchi or rales  Chest wall:    No tenderness to palpitation  Heart:    Regular rate and rhythm without murmurs, gallops, rubs  Abdomen:     Soft, non-tender, nondistended, normal bowel sounds, no organomegaly  Genitalia:    deferred  Rectal:    deferred  Extremities:   No clubbing, cyanosis or edema.  Pulses:   2+ and symmetric all extremities  Skin:   Skin color, texture, turgor normal, no rashes or lesions  Lymph nodes:   Cervical, supraclavicular, and axillary nodes normal  Neurologic:   CNII-XII intact. Normal strength, sensation and reflexes      throughout    Labs on Admission:  Basic Metabolic Panel:  Recent Labs Lab 07/10/14 1106 07/15/14 1524  NA 128* 135  K 3.6 4.1  CL 88* 94*  CO2 30 29  GLUCOSE 73 124*  BUN 43* 64*  CREATININE 1.45* 1.42*  CALCIUM 9.4 9.5   Liver Function Tests: No results for input(s): AST, ALT, ALKPHOS, BILITOT, PROT, ALBUMIN in the last 168 hours. No results for input(s): LIPASE, AMYLASE in the last 168 hours. No results for input(s): AMMONIA in the last 168 hours. CBC:  Recent Labs Lab 07/15/14 1524  WBC 9.2  HGB 12.7  HCT 38.9  MCV 93.3  PLT 201   Cardiac Enzymes: No results for input(s): CKTOTAL, CKMB, CKMBINDEX, TROPONINI in the last 168 hours.  BNP (last 3 results) No results for input(s): PROBNP in the last 8760 hours. CBG: No results for input(s): GLUCAP in the last 168 hours.  Radiological Exams on Admission: No results found.  EKG: Independently reviewed.  Assessment/Plan Principal Problem:   Acute posterior epistaxis Active Problems:   Essential hypertension   1. Acute posterior epistaxis - likely a hypertensive bleed given that patients BP on arrival to ED today is 191/101. 1. Admit patient to cone 2. Dr. Aalyiah Holster to eval in AM, patient likely will  need scope and cauterization which is what she required the last time she had a nose bleed like this. 3. BP control: 1. Resume home metoprolol and Cardizem 2. Adding PRN hydralazine 3. Spoke with patient regarding risk for recurrent bleed or even hemorrhagic CVA with uncontrolled HTN. 4. 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 5. Tele monitor, also patient had run of NSVT while in ED. 2. Aberrant conduction of A.Fib - aberrant conduction of A.Fib this occured while  I was in the room with the patient, monitor appears to be reading this as V.Tach but this is far too slow for V.Tach 1. Patient was completely asymptomatic during the run and denied symptoms even while runs are going on in ED, no chest pain, no SOB, she is talking on phone in room during several of the runs. 2. Tele monitor 3. Day team may wish to talk with cards after issue #1 resolved tomorrow, I briefly spoke with the fellow on call who confirmed that as long as this was a-symptomatic that there wasn't much to do. 4. Will check troponin-I but ACS seems less likely given that she is asymptomatic 5. Last echo was done on 06/08/14, has normal EF although severe LVH and massive dilation of both atria as well as pulmonary HTN.    Code Status: Full  Family Communication: No family in room Disposition Plan: Admit to Inpatient   Time spent: 70 min  Alyene Predmore M. [facility] Pager [phone]  If 7AM-7PM, please contact the day team taking care of the patient [email or web address] Password [facility] 07/15/2014, 7:50 PM

## 2014-07-15 NOTE — ED Provider Notes (Signed)
CSN: [id number]     Arrival date & time 07/15/14  1411 History   First MD Initiated Contact with Patient 07/15/14 1507     Chief Complaint  Patient presents with  . Epistaxis     (Consider location/radiation/quality/duration/timing/severity/associated sxs/prior Treatment) HPI Comments: Patient presents to the ER for evaluation of nosebleed. Patient reports that she started having heavy bleeding from the left side of her nose at 9:30 this morning. She has been holding pressure intermittently, but has not stopped all day. Patient is not experiencing any weakness, dizziness, chest pain, shortness of breath, palpitations. She denies any injury to the nose. She is not on blood thinners.  Patient is a 81 y.o. female presenting with nosebleeds.  Epistaxis   Past Medical History  Diagnosis Date  . Mitral valve disease   . Mitral regurgitation   . Hypothyroidism   . B12 deficiency   . Heart murmur   . Allergy   . OA (osteoarthritis)   . Migraines   . Atrial fibrillation     pt declines anticoag (jehovah's witness)  . Cardiomegaly     on CT scan 03/2011   Past Surgical History  Procedure Laterality Date  . Tonsillectomy    . Nasal endoscopy with epistaxis control  07/15/2012    Procedure: NASAL ENDOSCOPY WITH EPISTAXIS CONTROL;  Surgeon: Izora Gala, MD;  Location: [facility];  Service: ENT;  Laterality: N/A;   Family History  Problem Relation Age of Onset  . Stroke Mother   . Arthritis Father   . Hypertension Mother   . Stomach cancer Father    History  Substance Use Topics  . Smoking status: Former Research scientist (life sciences)  . Smokeless tobacco: Never Used  . Alcohol Use: Yes     Comment: Social   OB History    No data available     Review of Systems  HENT: Positive for nosebleeds.   All other systems reviewed and are negative.     Allergies  Sulfa antibiotics; Amlodipine; Cozaar; Edecrin; Lasix; Ramipril; and Hctz  Home Medications   Prior to Admission medications   Medication Sig  Start Date End Date Taking? Authorizing Provider  aspirin 81 MG tablet Take 81 mg by mouth daily.   Yes Historical Provider, MD  B Complex Vitamins (VITAMIN-B COMPLEX PO) Take by mouth.   Yes Historical Provider, MD  bumetanide (BUMEX) 1 MG tablet Take 1 tablet (1 mg total) by mouth daily. 06/14/14  Yes Eugenie Filler, MD  cholecalciferol (VITAMIN D) 1000 UNITS tablet Take 1,000 Units by mouth daily.   Yes Historical Provider, MD  diltiazem (CARDIZEM CD) 120 MG 24 hr capsule Take 1 capsule (120 mg total) by mouth daily. 06/17/14  Yes Darlin Coco, MD  docusate sodium (COLACE) 100 MG capsule Take 100 mg by mouth daily as needed for mild constipation.   Yes Historical Provider, MD  latanoprost (XALATAN) 0.005 % ophthalmic solution Place 1 drop into both eyes daily.  04/16/14  Yes Historical Provider, MD  levothyroxine (SYNTHROID, LEVOTHROID) 50 MCG tablet TAKE ONE TABLET BY MOUTH ONE TIME DAILY 07/09/11  Yes Rowe Clack, MD  metoprolol succinate (TOPROL-XL) 25 MG 24 hr tablet Take 1.5 tablets (37.5 mg total) by mouth daily. 06/14/14  Yes Eugenie Filler, MD  sodium chloride (OCEAN) 0.65 % SOLN nasal spray Place 1 spray into both nostrils at bedtime as needed for congestion.   Yes Historical Provider, MD  spironolactone (ALDACTONE) 25 MG tablet Take 12.5 mg by mouth every other  day.    Yes Historical Provider, MD   BP 195/110 mmHg  Pulse 101  Temp(Src) 97.5 F (36.4 C) (Oral)  Resp 20  SpO2 98% Physical Exam  Constitutional: She is oriented to person, place, and time. She appears well-developed and well-nourished. No distress.  HENT:  Head: Normocephalic and atraumatic.  Right Ear: Hearing normal.  Left Ear: Hearing normal.  Nose: Epistaxis (left side, from posterior region) is observed.  Mouth/Throat: Oropharynx is clear and moist and mucous membranes are normal.  Eyes: Conjunctivae and EOM are normal. Pupils are equal, round, and reactive to light.  Neck: Normal range of motion.  Neck supple.  Cardiovascular: Regular rhythm, S1 normal and S2 normal.  Exam reveals no gallop and no friction rub.   No murmur heard. Pulmonary/Chest: Effort normal and breath sounds normal. No respiratory distress. She exhibits no tenderness.  Abdominal: Soft. Normal appearance and bowel sounds are normal. There is no hepatosplenomegaly. There is no tenderness. There is no rebound, no guarding, no tenderness at McBurney's point and negative Murphy's sign. No hernia.  Musculoskeletal: Normal range of motion.  Neurological: She is alert and oriented to person, place, and time. She has normal strength. No cranial nerve deficit or sensory deficit. Coordination normal. GCS eye subscore is 4. GCS verbal subscore is 5. GCS motor subscore is 6.  Skin: Skin is warm, dry and intact. No rash noted. No cyanosis.  Psychiatric: She has a normal mood and affect. Her speech is normal and behavior is normal. Thought content normal.  Nursing note and vitals reviewed.   ED Course  Procedures (including critical care time) Labs Review Labs Reviewed  BASIC METABOLIC PANEL - Abnormal; Notable for the following:    Chloride 94 (*)    Glucose, Bld 124 (*)    BUN 64 (*)    Creatinine, Ser 1.42 (*)    GFR calc non Af Amer 33 (*)    GFR calc Af Amer 39 (*)    All other components within normal limits  CBC  PROTIME-INR    Imaging Review No results found.   EKG Interpretation   Date/Time:  Tuesday July 15 2014 19:09:20 EST Ventricular Rate:  111 PR Interval:    QRS Duration: 131 QT Interval:  362 QTC Calculation: 492 R Axis:   -71 Text Interpretation:  Atrial fibrillation Paired ventricular premature  complexes Left bundle branch block No significant change since last  tracing Confirmed by Dayle Sherpa  MD, Tiffanni Scarfo [phone]) on 07/15/2014 7:19:40  PM      MDM   Final diagnoses:  Epistaxis    Patient presents to the ER for evaluation of nosebleed. Patient reports a previous nosebleed  requiring intervention by Dr. Elaysha Holster, ENT. She reports that the bleeding started early this morning and has been continuous through the day. Examination revealed moderate bleeding from the posterior aspect of the nasal passage on the left side. A 7.5 rapid Rhino nasal balloon was placed in the left nare and inflated. Patient still had moderate bleeding around the balloon. This was removed and a 9.0 rapid Rhino anterior/posterior balloon was placed in the left nare. Both balloons were inflated with 3-4 mL of air. Bleeding did slow but continued. Additional air was placed in both chambers. Patient still has a small amount of bleeding, but has significantly slowed.  Case was discussed with Dr. Deeanna Holster, ENT. He does feel that the patient will likely require intervention for cauterization. Patient will therefore be admitted to the hospitalist overnight to be observed  and he will see the patient in the morning for procedure. If patient worsens overnight, he is available.    Orpah Greek, MD 07/15/14 [phone]

## 2014-07-15 NOTE — ED Notes (Signed)
Patient has history of nose bleed with nasal endoscopy.

## 2014-07-15 NOTE — ED Notes (Signed)
[facility] called for transport. 

## 2014-07-15 NOTE — ED Notes (Addendum)
Pt c/o spontaneous epistaxis onset 0930 this morning, pt denies injury, denies heavy blood flow, states that "it just won't stop."

## 2014-07-15 NOTE — ED Notes (Signed)
Dr Justin Mend sending pt to ED for nose bleed. Pt has hx of nosebleeds, had to be cauterized by ENT 2 years ago. Nosebleed started this morning around 0930.

## 2014-07-15 NOTE — Progress Notes (Addendum)
Pt has arrived on floor. Pt is A&Ox4, transferred to bed, bed low and locked. Vital signs taken, last BP 184/111, prn hydralazine ordered.    Justine Null, RN 07/15/2014

## 2014-07-16 ENCOUNTER — Encounter (HOSPITAL_COMMUNITY): Payer: Self-pay | Admitting: General Practice

## 2014-07-16 LAB — MAGNESIUM: Magnesium: 1.7 mg/dL (ref 1.5–2.5)

## 2014-07-16 LAB — CLOSTRIDIUM DIFFICILE BY PCR: Toxigenic C. Difficile by PCR: NEGATIVE

## 2014-07-16 LAB — TROPONIN I
TROPONIN I: 0.33 ng/mL — AB (ref <0.031)
Troponin I: 0.32 ng/mL — ABNORMAL HIGH (ref <0.031)
Troponin I: 0.28 ng/mL — ABNORMAL HIGH (ref <0.031)

## 2014-07-16 MED ORDER — TORSEMIDE 20 MG/2ML IV SOLN
20.0000 mg | Freq: Two times a day (BID) | INTRAVENOUS | Status: DC
  Filled 2014-07-16: qty 2

## 2014-07-16 MED ORDER — BUMETANIDE 0.25 MG/ML IJ SOLN
1.0000 mg | Freq: Two times a day (BID) | INTRAMUSCULAR | Status: AC
  Administered 2014-07-16 – 2014-07-17 (×2): 1 mg via INTRAVENOUS
  Filled 2014-07-16 (×2): qty 4

## 2014-07-16 NOTE — Consult Note (Signed)
ELECTROPHYSIOLOGY CONSULT NOTE  Patient ID: [name] [name], MRN: [id number], DOB/AGE: 1929/07/27 81 y.o. Admit date: 07/15/2014 Date of Consult: 07/16/2014  Primary Physician: Lilian Coma, MD Primary Cardiologist: TB  Chief Complaint: atrial fibrillation   HPI [name] [name] is a 81 y.o. female  With hx of permanent atrial fibrillation, not on anticoagulation as she is Jehovah's Witness and is concerned about bleeding. She has hx of hypertension and normal LV function and is managed with rate control with dig and atenolol  She is admitted with epistaxis  Tn borderline elevated 0.33-0.10 ( notably also minimally elevate (0.06-07) when admiitted 12/15  Echocardiogram on 12/15 showing an ejection fraction of 81-82% with diastolic dysfunction with severe LVH(18/15) There was a restrictive mitral inflow. Her peak PA pressure was elevated at 74 there was moderate tricuspid regurgitation. Both of her right and left atria were severely dilated. She has mod-severe mitral regurgitation and mild- modaortic regurgitation. At that time she had been admitted with chest apin after a fall  She has chronic 2 pillow orthopnea and pedal edema with DOE   She has had problems with renal function which resulted in the stopping of dig and lisinopril  For reasons not clear her atenolol was changed to metoprolol at last visit  She has had problems with reaccumulating edema post discharge     Past Medical History  Diagnosis Date  . Mitral valve disease   . Mitral regurgitation   . Hypothyroidism   . B12 deficiency   . Heart murmur   . Allergy   . OA (osteoarthritis)   . Migraines   . Atrial fibrillation     pt declines anticoag (jehovah's witness)  . Cardiomegaly     on CT scan 03/2011      Surgical History:  Past Surgical History  Procedure Laterality Date  . Tonsillectomy    . Nasal endoscopy with epistaxis control  07/15/2012    Procedure: NASAL ENDOSCOPY WITH EPISTAXIS CONTROL;   Surgeon: Izora Gala, MD;  Location: [facility];  Service: ENT;  Laterality: N/A;     Home Meds: Prior to Admission medications   Medication Sig Start Date End Date Taking? Authorizing Provider  aspirin 81 MG tablet Take 81 mg by mouth daily.   Yes Historical Provider, MD  B Complex Vitamins (VITAMIN-B COMPLEX PO) Take by mouth.   Yes Historical Provider, MD  cholecalciferol (VITAMIN D) 1000 UNITS tablet Take 1,000 Units by mouth daily.   Yes Historical Provider, MD  diltiazem (CARDIZEM CD) 120 MG 24 hr capsule Take 1 capsule (120 mg total) by mouth daily. 06/17/14  Yes Darlin Coco, MD  docusate sodium (COLACE) 100 MG capsule Take 100 mg by mouth daily as needed for mild constipation.   Yes Historical Provider, MD  latanoprost (XALATAN) 0.005 % ophthalmic solution Place 1 drop into both eyes daily.  04/16/14  Yes Historical Provider, MD  levothyroxine (SYNTHROID, LEVOTHROID) 50 MCG tablet TAKE ONE TABLET BY MOUTH ONE TIME DAILY 07/09/11  Yes Rowe Clack, MD  metoprolol succinate (TOPROL-XL) 25 MG 24 hr tablet Take 1.5 tablets (37.5 mg total) by mouth daily. 06/14/14  Yes Eugenie Filler, MD  sodium chloride (OCEAN) 0.65 % SOLN nasal spray Place 1 spray into both nostrils at bedtime as needed for congestion.   Yes Historical Provider, MD  spironolactone (ALDACTONE) 25 MG tablet Take 12.5 mg by mouth every other day.    Yes Historical Provider, MD  bumetanide (BUMEX) 1 MG tablet TAKE 1 TABLET BY  MOUTH DAILY 07/16/14   Darlin Coco, MD    Inpatient Medications:  . antiseptic oral rinse  7 mL Mouth Rinse q12n4p  . chlorhexidine  15 mL Mouth Rinse BID  . cholecalciferol  1,000 Units Oral Daily  . diltiazem  120 mg Oral Daily  . latanoprost  1 drop Both Eyes Daily  . levothyroxine  50 mcg Oral QAC breakfast  . metoprolol succinate  37.5 mg Oral Daily  . sodium chloride  3 mL Intravenous Q12H     Allergies:  Allergies  Allergen Reactions  . Sulfa Antibiotics Itching  . Amlodipine      Pain in jaw, and peripheral edema  . Cozaar Other (See Comments)    insomnia  . Edecrin [Ethacrynic Acid] Nausea And Vomiting  . Lasix [Furosemide] Itching    Takes furosemide at home  . Other     PT IS OF [place]. NO BLOOD PRODUCTS.  . Ramipril Other (See Comments)    Dyspnea - Tolerates lisinopril  . Hctz [Hydrochlorothiazide] Rash    Takes Triamterene/HCTZ at home.    History   Social History  . Marital Status: Single    Spouse Name: N/A    Number of Children: N/A  . Years of Education: N/A   Occupational History  . Retired     Pharmacist, hospital   Social History Main Topics  . Smoking status: Former Research scientist (life sciences)  . Smokeless tobacco: Never Used  . Alcohol Use: Yes     Comment: Social  . Drug Use: No  . Sexual Activity: Not on file   Other Topics Concern  . Not on file   Social History Narrative   Patient is Jehovah Witness, no blood products allowed.     Family History  Problem Relation Age of Onset  . Stroke Mother   . Arthritis Father   . Hypertension Mother   . Stomach cancer Father      ROS:  Please see the history of present illness.     All other systems reviewed and negative.    Physical Exam:   Blood pressure 143/70, pulse 108, temperature 98.1 F (36.7 C), temperature source Oral, resp. rate 20, height 5' (1.524 m), weight 101 lb 9.6 oz (46.085 kg), SpO2 92 %. General: Well developed, cachetic  female in no acute distress. Head: Normocephalic, atraumatic, sclera non-icteric, no xanthomas, nares are without discharge. EENT: normal Lymph Nodes:  none Back: with yphosis , no CVA tendersness Neck: Negative for carotid bruits. JVD 10 Lungs: Clear bilaterally to auscultation without wheezes, rales, or rhonchi. Breathing is unlabored. Heart: Irregularly irregular rate  2/6 systolic murmur , no rubs, or gallops appreciated. Abdomen: Soft, non-tender, non-distended with normoactive bowel sounds. No hepatomegaly. No rebound/guarding. No obvious  abdominal masses. Msk:  Strength and tone appear normal for age. Extremities: No clubbing or cyanosis. 2+ edema.  Distal pedal pulses are 2+ and equal bilaterally. Skin: Warm and Dry Neuro: Alert and oriented X 3. CN III-XII intact Grossly normal sensory and motor function . Psych:  Responds to questions appropriately with a normal affect.      Labs: Cardiac Enzymes  Recent Labs  07/15/14 2017 07/16/14 1139  TROPONINI 0.10* 0.32*   CBC Lab Results  Component Value Date   WBC 9.2 07/15/2014   HGB 12.7 07/15/2014   HCT 38.9 07/15/2014   MCV 93.3 07/15/2014   PLT 201 07/15/2014   PROTIME:  Recent Labs  07/15/14 1524  LABPROT 14.9  INR 1.16   Chemistry  Recent Labs Lab 07/15/14 1524  NA 135  K 4.1  CL 94*  CO2 29  BUN 64*  CREATININE 1.42*  CALCIUM 9.5  GLUCOSE 124*   Lipids Lab Results  Component Value Date   CHOL 190 06/28/2010   HDL 84.10 06/28/2010   LDLCALC 92 06/28/2010   TRIG 71.0 06/28/2010   BNP No results found for: PROBNP Miscellaneous No results found for: DDIMER  Radiology/Studies:  No results found.  EKG: afib 105  LBBB  133 msec With Vent couplet RBBB  pattern   Assessment and Plan:  Atrial fibrillation permanent  Now with RVR  Hypertensive Urgency  Epistaxis  ASA  Rx  Elevated troponin  Renal Insufficiency gd 3  Ventricular Tachycardia  Elevated BUN  The wide complex tachycardia is most consistent with VT-- it is not closely coupled which we would expect from aberration.  It is irregular although it forms a pattern of bigeminy, again supportive of something more organized than atrial fibrillation.  Furthermore the bundle branch block pattern shifts  Would recheck echo, but if LV function still close to normal, would Rx W betablockers as you are, and increase as tolerated  Suspect troponin elevatation is related to hypertensive urgency;  No evidence  At this point clinically of ACS HR control is rapid and with  normal LV function would also  not use dig.  The best time for uptitration of rate control is now as she is on telemetry  This will also help with hypertension  I am also not a great fan of aspirin for atrial fibrillation.  Guidelines now note it as optional and data from [place] suggests if it is being used for atrial fibrillation it may increase mortality in most subgroups.  If Troponin rises would consider using it for this indication but not for afib  For now 1- increase betablocker as you are doing 2- follow telemetry 3- repeat echo for LV function  4.  Check Mg with Vt 5.  Out pt disscussions with Dr TB re apixaban versus aspirin 6. Continue diuresis  She is volume overloaded and I suspect elevated BUN may be related more to blood in the GI tract from her epistaxis   Virl Axe

## 2014-07-16 NOTE — Progress Notes (Addendum)
[facility] PROGRESS NOTE  [name] [name] SWH:[id number] DOB: Sep 25, 1929 DOA: 07/15/2014 PCP: [name] Coma, MD  Assessment/Plan: 1-A fib RVR;  Continue with Cardizem and metoprolol.  ? SVT  Cardiology consulted.  Was not on anticoagulation.  Check mg level.   2-Acute posterior Epistaxis; Continue with nose packing. Continue with packing for 5 days. Needs to follow up with Dr [name] [name] on Monday for packing removal.   3-Mild elevated troponin;  In setting A fib.  Continue to cycle. Cardiology consulted.   4-Diarrhea; c diff negative.   5-CKD; stage III; cr baseline 1.6 Stable. Monitor.   Code Status: full code.  Family Communication: care discussed with patient.  Disposition Plan: awaiting cardiology evaluation   Consultants:  Cardiology  Dr [name] [name]  Procedures:  none  Antibiotics:  none  HPI/Subjective: No further nose bleed.   Objective: Filed Vitals:   07/16/14 0504  BP: 134/69  Pulse: 94  Temp: 98.9 F (37.2 C)  Resp: 18    Intake/Output Summary (Last 24 hours) at 07/16/14 1034 Last data filed at 07/16/14 0728  Gross per 24 hour  Intake      0 ml  Output    700 ml  Net   -700 ml   Filed Weights   07/16/14 0300  Weight: 46.085 kg (101 lb 9.6 oz)    Exam:   General: Alert in no distress. nasal packing in place.   Cardiovascular: S 1,. S 2 RRR  Respiratory: CTA  Abdomen: BS present, soft, nt  Musculoskeletal: trace edema.   Data Reviewed: Basic Metabolic Panel:  Recent Labs Lab 07/10/14 1106 07/15/14 1524  NA 128* 135  K 3.6 4.1  CL 88* 94*  CO2 30 29  GLUCOSE 73 124*  BUN 43* 64*  CREATININE 1.45* 1.42*  CALCIUM 9.4 9.5   Liver Function Tests: No results for input(s): AST, ALT, ALKPHOS, BILITOT, PROT, ALBUMIN in the last 168 hours. No results for input(s): LIPASE, AMYLASE in the last 168 hours. No results for input(s): AMMONIA in the last 168 hours. CBC:  Recent Labs Lab 07/15/14 1524  WBC 9.2  HGB 12.7   HCT 38.9  MCV 93.3  PLT 201   Cardiac Enzymes:  Recent Labs Lab 07/15/14 2017  TROPONINI 0.10*   BNP (last 3 results)  Recent Labs  06/10/14 0437 06/13/14 0456 06/14/14 0419  BNP 715.3* 506.5* 428.8*    ProBNP (last 3 results) No results for input(s): PROBNP in the last 8760 hours.  CBG: No results for input(s): GLUCAP in the last 168 hours.  Recent Results (from the past 240 hour(s))  Clostridium Difficile by PCR     Status: None   Collection Time: 07/15/14 11:44 PM  Result Value Ref Range Status   C difficile by pcr NEGATIVE NEGATIVE Final     Studies: No results found.  Scheduled Meds: . antiseptic oral rinse  7 mL Mouth Rinse q12n4p  . chlorhexidine  15 mL Mouth Rinse BID  . cholecalciferol  1,000 Units Oral Daily  . diltiazem  120 mg Oral Daily  . latanoprost  1 drop Both Eyes Daily  . levothyroxine  50 mcg Oral QAC breakfast  . metoprolol succinate  37.5 mg Oral Daily  . sodium chloride  3 mL Intravenous Q12H   Continuous Infusions: . sodium chloride 75 mL/hr at 07/16/14 0122    Principal Problem:   Acute posterior epistaxis Active Problems:   Essential hypertension   Left bundle branch block   Chronic a-fib  Time spent: 35 minutes.     [name] [name] A  [facility] Pager [phone]. If 7PM-7AM, please contact night-coverage at [email or web address], password [facility] 07/16/2014, 10:34 AM  LOS: 1 day

## 2014-07-16 NOTE — Progress Notes (Signed)
[facility]  Patient Status: Active (receiving services up to time of hospitalization)  [facility] is providing the following services: RN, PT, MSW and HHA  If patient discharges after hours, please call [phone].   Consepcion Hearing 07/16/2014, 8:57 AM

## 2014-07-16 NOTE — Consult Note (Signed)
Reason for Consult:Epistaxis Referring Physician: Elmarie Shiley, MD  [name] [name] is an 81 y.o. female.  HPI: Heavy bleeding from the left side yesterday. Similar to what she had a couple years ago. Bleeding has stopped with packing in place.  Past Medical History  Diagnosis Date  . Mitral valve disease   . Mitral regurgitation   . Hypothyroidism   . B12 deficiency   . Heart murmur   . Allergy   . OA (osteoarthritis)   . Migraines   . Atrial fibrillation     pt declines anticoag (jehovah's witness)  . Cardiomegaly     on CT scan 03/2011    Past Surgical History  Procedure Laterality Date  . Tonsillectomy    . Nasal endoscopy with epistaxis control  07/15/2012    Procedure: NASAL ENDOSCOPY WITH EPISTAXIS CONTROL;  Surgeon: Izora Gala, MD;  Location: [facility];  Service: ENT;  Laterality: N/A;    Family History  Problem Relation Age of Onset  . Stroke Mother   . Arthritis Father   . Hypertension Mother   . Stomach cancer Father     Social History:  reports that she has quit smoking. She has never used smokeless tobacco. She reports that she drinks alcohol. She reports that she does not use illicit drugs.  Allergies:  Allergies  Allergen Reactions  . Sulfa Antibiotics Itching  . Amlodipine     Pain in jaw, and peripheral edema  . Cozaar Other (See Comments)    insomnia  . Edecrin [Ethacrynic Acid] Nausea And Vomiting  . Lasix [Furosemide] Itching    Takes furosemide at home  . Other     PT IS OF [place]. NO BLOOD PRODUCTS.  . Ramipril Other (See Comments)    Dyspnea - Tolerates lisinopril  . Hctz [Hydrochlorothiazide] Rash    Takes Triamterene/HCTZ at home.    Medications: Reviewed  Results for orders placed or performed during the hospital encounter of 07/15/14 (from the past 48 hour(s))  CBC     Status: None   Collection Time: 07/15/14  3:24 PM  Result Value Ref Range   WBC 9.2 4.0 - 10.5 K/uL   RBC 4.17 3.87 - 5.11 MIL/uL    Hemoglobin 12.7 12.0 - 15.0 g/dL   HCT 38.9 36.0 - 46.0 %   MCV 93.3 78.0 - 100.0 fL   MCH 30.5 26.0 - 34.0 pg   MCHC 32.6 30.0 - 36.0 g/dL   RDW 14.6 11.5 - 15.5 %   Platelets 201 150 - 400 K/uL  Basic metabolic panel     Status: Abnormal   Collection Time: 07/15/14  3:24 PM  Result Value Ref Range   Sodium 135 135 - 145 mmol/L   Potassium 4.1 3.5 - 5.1 mmol/L   Chloride 94 (L) 96 - 112 mmol/L   CO2 29 19 - 32 mmol/L   Glucose, Bld 124 (H) 70 - 99 mg/dL   BUN 64 (H) 6 - 23 mg/dL   Creatinine, Ser 1.42 (H) 0.50 - 1.10 mg/dL   Calcium 9.5 8.4 - 10.5 mg/dL   GFR calc non Af Amer 33 (L) >90 mL/min   GFR calc Af Amer 39 (L) >90 mL/min    Comment: (NOTE) The eGFR has been calculated using the CKD EPI equation. This calculation has not been validated in all clinical situations. eGFR's persistently <90 mL/min signify possible Chronic Kidney Disease.    Anion gap 12 5 - 15  Protime-INR  Status: None   Collection Time: 07/15/14  3:24 PM  Result Value Ref Range   Prothrombin Time 14.9 11.6 - 15.2 seconds   INR 1.16 0.00 - 1.49  Troponin I (q 6hr x 3)     Status: Abnormal   Collection Time: 07/15/14  8:17 PM  Result Value Ref Range   Troponin I 0.10 (H) <0.031 ng/mL    Comment:        PERSISTENTLY INCREASED TROPONIN VALUES IN THE RANGE OF 0.04-0.49 ng/mL CAN BE SEEN IN:       -UNSTABLE ANGINA       -CONGESTIVE HEART FAILURE       -MYOCARDITIS       -CHEST TRAUMA       -ARRYHTHMIAS       -LATE PRESENTING MYOCARDIAL INFARCTION       -COPD   CLINICAL FOLLOW-UP RECOMMENDED.   Clostridium Difficile by PCR     Status: None   Collection Time: 07/15/14 11:44 PM  Result Value Ref Range   C difficile by pcr NEGATIVE NEGATIVE    No results found.  SWF:UXNATFTD except as listed in admit H&P  Blood pressure 134/69, pulse 94, temperature 98.9 F (37.2 C), temperature source Oral, resp. rate 18, height 5' (1.524 m), weight 46.085 kg (101 lb 9.6 oz), SpO2 96 %.  PHYSICAL  EXAM: Overall appearance:  Healthy appearing, in no distress Head:  Normocephalic, atraumatic. Ears: External ears normal. Nose: External nose is healthy in appearance.Inflatable packing in place on the left with dried blood but no active bleeding. Oral Cavity:  There are no mucosal lesions or masses identified. Neuro:  No identifiable neurologic deficits. Neck: No palpable neck masses.  Studies Reviewed: none  Procedures: none   Assessment/Plan: Recommend observe throughout the day. If no more bleeding, then keep pack in for 5 days, remove in the office next Monday. If any further bleeding, then I will bring her to the OR today or tomorrow am. She may eat for now.  [name] [name] 07/16/2014, 9:07 AM

## 2014-07-16 NOTE — Care Management Note (Unsigned)
    Page 1 of 2   07/16/2014     1:55:42 PM CARE MANAGEMENT NOTE 07/16/2014  Patient:  [name],[name]   Account Number:  [id number]  Date Initiated:  07/16/2014  Documentation initiated by:  [name] [name]  Subjective/Objective Assessment:   dx uncontrollable epitaxis  admit as observation- lives with sister.  patient is active with [facility] for RN, Pt, SW and aide, will resume.     Action/Plan:   patient has rhino rocket in- will observe today to make sure not bleeding.   Anticipated DC Date:  07/17/2014   Anticipated DC Plan:  [place]  CM consult      [facility] Choice  Resumption Of Svcs/PTA Provider   Choice offered to / List presented to:  C-1 Patient        [place] arranged  HH-1 RN  [place]      [place].   Status of service:  In process, will continue to follow Medicare Important Message given?   (If response is "NO", the following Medicare IM given date fields will be blank) Date Medicare IM given:   Medicare IM given by:   Date Additional Medicare IM given:   Additional Medicare IM given by:    Discharge Disposition:    Per UR Regulation:    If discussed at Long Length of Stay Meetings, dates discussed:    Comments:  07/16/14 [place], BSN [phone] patient is from home with sister, patient with [employer], per DR. Corbin [name] observe today to make no bleed.  Poss dc tomorrow.  Patient is active with [facility] for RN, PT, SW and aide, will resume services at dc.

## 2014-07-16 NOTE — Progress Notes (Signed)
UR completed 

## 2014-07-16 NOTE — Progress Notes (Addendum)
On call practitioner paged about patient's elevated BP of 184/111, HR in 120s-130s, nausea, and 3 episodes of diarrhea. Hydralazine order was clarified by Johnette Abraham Schoor, NP and patient given 10mg  IV hydralazine. Pt's BP came down to 131/70. C-diff protocol started per protocol, sample sent results pending. Nausea medication was also ordered and administered with positive results.      Justine Null, RN

## 2014-07-17 LAB — CBC
HCT: 32.9 % — ABNORMAL LOW (ref 36.0–46.0)
Hemoglobin: 11 g/dL — ABNORMAL LOW (ref 12.0–15.0)
MCH: 30.6 pg (ref 26.0–34.0)
MCHC: 33.4 g/dL (ref 30.0–36.0)
MCV: 91.4 fL (ref 78.0–100.0)
PLATELETS: 168 10*3/uL (ref 150–400)
RBC: 3.6 MIL/uL — ABNORMAL LOW (ref 3.87–5.11)
RDW: 15 % (ref 11.5–15.5)
WBC: 7.9 10*3/uL (ref 4.0–10.5)

## 2014-07-17 LAB — BASIC METABOLIC PANEL
Anion gap: 12 (ref 5–15)
BUN: 52 mg/dL — ABNORMAL HIGH (ref 6–23)
CO2: 25 mmol/L (ref 19–32)
Calcium: 9.2 mg/dL (ref 8.4–10.5)
Chloride: 98 mmol/L (ref 96–112)
Creatinine, Ser: 1.53 mg/dL — ABNORMAL HIGH (ref 0.50–1.10)
GFR calc non Af Amer: 31 mL/min — ABNORMAL LOW (ref >90)
GFR, EST AFRICAN AMERICAN: 35 mL/min — AB (ref >90)
GLUCOSE: 104 mg/dL — AB (ref 70–99)
POTASSIUM: 3.6 mmol/L (ref 3.5–5.1)
Sodium: 135 mmol/L (ref 135–145)

## 2014-07-17 MED ORDER — SPIRONOLACTONE 12.5 MG HALF TABLET
12.5000 mg | ORAL_TABLET | ORAL | Status: DC
  Administered 2014-07-17: 12.5 mg via ORAL
  Filled 2014-07-17: qty 1

## 2014-07-17 MED ORDER — MAGNESIUM SULFATE 2 GM/50ML IV SOLN
2.0000 g | Freq: Once | INTRAVENOUS | Status: AC
  Administered 2014-07-17: 2 g via INTRAVENOUS
  Filled 2014-07-17: qty 50

## 2014-07-17 NOTE — Progress Notes (Signed)
Patient Name: [name] [name]      SUBJECTIVE: feels better  Past Medical History  Diagnosis Date  . Mitral valve disease   . Mitral regurgitation   . Hypothyroidism   . B12 deficiency   . Heart murmur   . Allergy   . OA (osteoarthritis)   . Migraines   . Atrial fibrillation     pt declines anticoag (jehovah's witness)  . Cardiomegaly     on CT scan 03/2011    Scheduled Meds:  Scheduled Meds: . antiseptic oral rinse  7 mL Mouth Rinse q12n4p  . bumetanide  1 mg Intravenous Q12H  . chlorhexidine  15 mL Mouth Rinse BID  . cholecalciferol  1,000 Units Oral Daily  . diltiazem  120 mg Oral Daily  . latanoprost  1 drop Both Eyes Daily  . levothyroxine  50 mcg Oral QAC breakfast  . metoprolol succinate  37.5 mg Oral Daily  . sodium chloride  3 mL Intravenous Q12H   Continuous Infusions:  docusate sodium, hydrALAZINE, ondansetron    PHYSICAL EXAM Filed Vitals:   07/16/14 1157 07/16/14 1322 07/16/14 2120 07/17/14 0535  BP: 152/75 143/70 132/77 121/63  Pulse: 104 108 97 89  Temp:  98.1 F (36.7 C) 98.7 F (37.1 C) 98.8 F (37.1 C)  TempSrc:  Oral Oral Oral  Resp:  20 15 16   Height:      Weight:      SpO2:  92% 96% 95%    Well developed and cachetic in no acute distress HENT normal  Plug in nose Neck supple   Carotids brisk and full without bruits Clear Irregularly irregular rate and rhythm with controlled  ventricular response, no murmurs or gallops Abd-soft with active BS without hepatomegaly No Clubbing cyanosis edema Skin-warm and dry A & Oriented  Grossly normal sensory and motor function   TELEMETRY: Reviewed telemetry pt in *afib   Intake/Output Summary (Last 24 hours) at 07/17/14 0717 Last data filed at 07/17/14 0542  Gross per 24 hour  Intake    120 ml  Output   1250 ml  Net  -1130 ml    LABS: Basic Metabolic Panel:  Recent Labs Lab 07/10/14 1106 07/15/14 1524 07/16/14 1925  NA 128* 135  --   K 3.6 4.1  --   CL 88*  94*  --   CO2 30 29  --   GLUCOSE 73 124*  --   BUN 43* 64*  --   CREATININE 1.45* 1.42*  --   CALCIUM 9.4 9.5  --   MG  --   --  1.7   Cardiac Enzymes:  Recent Labs  07/16/14 1139 07/16/14 1654 07/16/14 2230  TROPONINI 0.32* 0.33* 0.28*   CBC:  Recent Labs Lab 07/15/14 1524  WBC 9.2  HGB 12.7  HCT 38.9  MCV 93.3  PLT 201   PROTIME:  Recent Labs  07/15/14 1524  LABPROT 14.9  INR 1.16   Liver Function Tests: No results for input(s): AST, ALT, ALKPHOS, BILITOT, PROT, ALBUMIN in the last 72 hours. No results for input(s): LIPASE, AMYLASE in the last 72 hours. BNP: BNP (last 3 results)  Recent Labs  06/10/14 0437 06/13/14 0456 06/14/14 0419  BNP 715.3* 506.5* 428.8*   Mg 1.7    ASSESSMENT AND PLAN:  Principal Problem:   Acute posterior epistaxis Active Problems:   Essential hypertension   Left bundle branch block   Chronic a-fib  Continue rate control  HR improved OK to discharge Has followup already with cardiology  Signed, Virl Axe MD  07/17/2014

## 2014-07-17 NOTE — Discharge Summary (Signed)
Physician Discharge Summary  [name] [name] [name] EUM:[id number] DOB: 1930/05/15 DOA: 07/15/2014  PCP: [name] [name] [name]  Admit date: 07/15/2014 Discharge date: 07/17/2014  Time spent: 35 minutes  Recommendations for Outpatient Follow-up:  Need B-met to follow renal function.  Needs to follow up with Dr Susanne Holster for nasal packing removal.   Discharge Diagnoses:    Acute posterior epistaxis   [name] [name] [name]   Essential hypertension   Left bundle branch block   Chronic [name] [name] [name]   Discharge Condition: Stable.   Diet recommendation: Heart Healthy  Filed Weights   07/16/14 0300  Weight: 46.085 kg (101 lb 9.6 oz)    History of present illness:  [name] [name] [name] is [name] [name] 80 y.o. female who presents to the ED for evaluation of L nostril nose bleed. Heavy bleeding from L side of nose onset at 9:30 AM this morning. She does admit she hasnt taken her BP meds today. Holding pressure intermittently but has not stopped all day so she presented to the ED. Does have h/o [name] [name].[name] but not on any blood thinners other than 81 ASA daily.  Hospital Course:  1-[name] [name] [name];  Continue with Cardizem and metoprolol.  SVT  Cardiology consulted. No further episodes. Ok to discharge per cardiology.  Was not on anticoagulation.  Mg replaced.   2-Acute posterior Epistaxis; Continue with nose packing. Continue with packing for 5 days. Needs to follow up with Dr Alivya Holster on Monday for packing removal.   3-Mild elevated troponin;  In setting [name] [name] [name].  Cardiology consulted.  No further evaluation.   4-Diarrhea; c diff negative.   5-CKD; stage III; cr baseline 1.6 Stable. Monitor.  Procedures:  none  Consultations:  Cardiology  Dr Sejal Holster.   Discharge Exam: Filed Vitals:   07/17/14 0535  BP: 121/63  Pulse: 89  Temp: 98.8 F (37.1 C)  Resp: 16    General: Alert in no distress. Nasal packing in place.  Cardiovascular: S 1, S 2 RRR Respiratory: CTA  Discharge Instructions   Discharge Instructions     Diet - low sodium heart healthy    Complete by:  As directed      Increase activity slowly    Complete by:  As directed           Current Discharge Medication List    CONTINUE these medications which have NOT CHANGED   Details  B Complex Vitamins (VITAMIN-B COMPLEX PO) Take by mouth.    cholecalciferol (VITAMIN D) 1000 UNITS tablet Take 1,000 Units by mouth daily.    diltiazem (CARDIZEM CD) 120 MG 24 hr capsule Take 1 capsule (120 mg total) by mouth daily. Qty: 30 capsule, Refills: 2    docusate sodium (COLACE) 100 MG capsule Take 100 mg by mouth daily as needed for mild constipation.    latanoprost (XALATAN) 0.005 % ophthalmic solution Place 1 drop into both eyes daily.     levothyroxine (SYNTHROID, LEVOTHROID) 50 MCG tablet TAKE ONE TABLET BY MOUTH ONE TIME DAILY Qty: 30 tablet, Refills: 5    metoprolol succinate (TOPROL-XL) 25 MG 24 hr tablet Take 1.5 tablets (37.5 mg total) by mouth daily. Qty: 60 tablet, Refills: 0    sodium chloride (OCEAN) 0.65 % SOLN nasal spray Place 1 spray into both nostrils at bedtime as needed for congestion.    spironolactone (ALDACTONE) 25 MG tablet Take 12.5 mg by mouth every other day.     bumetanide (BUMEX) 1 MG tablet TAKE 1 TABLET BY MOUTH DAILY Qty: 30 tablet, Refills: 6  STOP taking these medications     aspirin 81 MG tablet        Allergies  Allergen Reactions  . Sulfa Antibiotics Itching  . Amlodipine     Pain in jaw, and peripheral edema  . Cozaar Other (See Comments)    insomnia  . Edecrin [Ethacrynic Acid] Nausea And Vomiting  . Lasix [Furosemide] Itching  . Other     PT IS OF [place]. NO BLOOD PRODUCTS.  . Ramipril Other (See Comments)    Dyspnea - Tolerates lisinopril  . Hctz [Hydrochlorothiazide] Rash    Takes Triamterene/HCTZ at home.   Follow-up Information    Follow up with [name] [name] [name].   Specialty:  Family Medicine   Contact information:   [place]  [address] [place] [address] [phone]       Follow up with Izora Gala, [name].   Specialty:  Otolaryngology   Why:  make appointment for monday   Contact information:   [address] [place]  [address] [phone]        The results of significant diagnostics from this hospitalization (including imaging, microbiology, ancillary and laboratory) are listed below for reference.    Significant Diagnostic Studies: No results found.  Microbiology: Recent Results (from the past 240 hour(s))  Clostridium Difficile by PCR     Status: None   Collection Time: 07/15/14 11:44 PM  Result Value Ref Range Status   C difficile by pcr NEGATIVE NEGATIVE Final     Labs: Basic Metabolic Panel:  Recent Labs Lab 07/15/14 1524 07/16/14 1925 07/17/14 0829  NA 135  --  135  K 4.1  --  3.6  CL 94*  --  98  CO2 29  --  25  GLUCOSE 124*  --  104*  BUN 64*  --  52*  CREATININE 1.42*  --  1.53*  CALCIUM 9.5  --  9.2  MG  --  1.7  --    Liver Function Tests: No results for input(s): AST, ALT, ALKPHOS, BILITOT, PROT, ALBUMIN in the last 168 hours. No results for input(s): LIPASE, AMYLASE in the last 168 hours. No results for input(s): AMMONIA in the last 168 hours. CBC:  Recent Labs Lab 07/15/14 1524 07/17/14 0829  WBC 9.2 7.9  HGB 12.7 11.0*  HCT 38.9 32.9*  MCV 93.3 91.4  PLT 201 168   Cardiac Enzymes:  Recent Labs Lab 07/15/14 2017 07/16/14 1139 07/16/14 1654 07/16/14 2230  TROPONINI 0.10* 0.32* 0.33* 0.28*   BNP: BNP (last 3 results)  Recent Labs  06/10/14 0437 06/13/14 0456 06/14/14 0419  BNP 715.3* 506.5* 428.8*    ProBNP (last 3 results) No results for input(s): PROBNP in the last 8760 hours.  CBG: No results for input(s): GLUCAP in the last 168 hours.     SignedNiel Hummer [name] [name]  [facility] 07/17/2014, 12:30 PM

## 2014-09-04 ENCOUNTER — Ambulatory Visit (INDEPENDENT_AMBULATORY_CARE_PROVIDER_SITE_OTHER): Payer: Medicare Other | Admitting: Cardiology

## 2014-09-04 ENCOUNTER — Encounter: Payer: Self-pay | Admitting: Cardiology

## 2014-09-04 VITALS — BP 126/64 | HR 82 | Ht 60.0 in | Wt 105.4 lb

## 2014-09-04 DIAGNOSIS — I10 Essential (primary) hypertension: Secondary | ICD-10-CM | POA: Diagnosis not present

## 2014-09-04 DIAGNOSIS — I160 Hypertensive urgency: Secondary | ICD-10-CM

## 2014-09-04 LAB — BASIC METABOLIC PANEL
BUN: 37 mg/dL — AB (ref 6–23)
CO2: 35 mEq/L — ABNORMAL HIGH (ref 19–32)
CREATININE: 1.35 mg/dL — AB (ref 0.40–1.20)
Calcium: 9.2 mg/dL (ref 8.4–10.5)
Chloride: 90 mEq/L — ABNORMAL LOW (ref 96–112)
GFR: 39.81 mL/min — AB (ref >60.00)
GLUCOSE: 103 mg/dL — AB (ref 70–99)
Potassium: 3.5 mEq/L (ref 3.5–5.1)
Sodium: 131 mEq/L — ABNORMAL LOW (ref 135–145)

## 2014-09-04 NOTE — Progress Notes (Signed)
Quick Note:  Please report to patient. The recent labs are stable. Continue same medication and careful diet. Kidney function is better. ______ 

## 2014-09-04 NOTE — Progress Notes (Signed)
Cardiology Office Note   Date:  09/04/2014   ID:  [name] [name], DOB 06-06-1930, MRN [id number]  PCP:  Lilian Coma, MD  Cardiologist:   Darlin Coco, MD   No chief complaint on file.     History of Present Illness: [name] [name] is a 81 y.o. female who presents for a four-month follow-up office visit This pleasant 81 year old woman is seen for followup of her chronic atrial fibrillation. She is a medical patient of Dr. Stephanie Acre. She has a history of chronic atrial fibrillation and essential hypertension. She also has pulmonary hypertension. She is a Restaurant manager, fast food. She refuses anticoagulation for her atrial fibrillation because she is concerned about bleeding.  The patient was hospitalized at the end of December 2015 after having severe epistaxis.  Her echocardiogram on 06/08/14 showed an ejection fraction of 50-55% with LVH.  She was found to have mild to moderate aortic insufficiency, moderate to severe mitral regurgitation, and severe tricuspid regurgitation with pulmonary artery pressure of 64.  She also had marked biatrial enlargement. She should be on long-term anticoagulation but she declines anticoagulation.  I spoke with her about Eliquis and she does not want to be on anticoagulation.  Part of her hesitancy is the fact that she has a concern about blood transfusions because of her religion. On her last hospitalization she had a slight bump in her troponins.  She remains on low-dose aspirin 81 mg daily.  She has not had any recurrent epistaxis.   Past Medical History  Diagnosis Date  . Mitral valve disease   . Mitral regurgitation   . Hypothyroidism   . B12 deficiency   . Heart murmur   . Allergy   . OA (osteoarthritis)   . Migraines   . Atrial fibrillation     pt declines anticoag (jehovah's witness)  . Cardiomegaly     on CT scan 03/2011    Past Surgical History  Procedure Laterality Date  . Tonsillectomy    . Nasal endoscopy with epistaxis  control  07/15/2012    Procedure: NASAL ENDOSCOPY WITH EPISTAXIS CONTROL;  Surgeon: Izora Gala, MD;  Location: [facility];  Service: ENT;  Laterality: N/A;     Current Outpatient Prescriptions  Medication Sig Dispense Refill  . aspirin 81 MG tablet Take 81 mg by mouth as directed. ONE TABLET Monday, Wednesday, AND Friday ONLY    . B Complex Vitamins (VITAMIN-B COMPLEX PO) Take by mouth.    . bumetanide (BUMEX) 1 MG tablet TAKE 1 TABLET BY MOUTH DAILY 30 tablet 6  . cholecalciferol (VITAMIN D) 1000 UNITS tablet Take 1,000 Units by mouth daily.    . Coenzyme Q10 (COQ10 PO) Take by mouth daily.    Marland Kitchen diltiazem (CARDIZEM CD) 120 MG 24 hr capsule Take 1 capsule (120 mg total) by mouth daily. 30 capsule 2  . docusate sodium (COLACE) 100 MG capsule Take 100 mg by mouth daily as needed for mild constipation.    Marland Kitchen latanoprost (XALATAN) 0.005 % ophthalmic solution Place 1 drop into both eyes daily.     Marland Kitchen levothyroxine (SYNTHROID, LEVOTHROID) 50 MCG tablet TAKE ONE TABLET BY MOUTH ONE TIME DAILY 30 tablet 5  . metoprolol succinate (TOPROL-XL) 25 MG 24 hr tablet Take 1.5 tablets (37.5 mg total) by mouth daily. 60 tablet 0  . sodium chloride (OCEAN) 0.65 % SOLN nasal spray Place 1 spray into both nostrils at bedtime as needed for congestion.    Marland Kitchen spironolactone (ALDACTONE) 25 MG tablet Take 12.5 mg  by mouth every other day.      No current facility-administered medications for this visit.    Allergies:   Sulfa antibiotics; Amlodipine; Cozaar; Edecrin; Lasix; Other; Ramipril; and Hctz    Social History:  The patient  reports that she has quit smoking. She has never used smokeless tobacco. She reports that she drinks alcohol. She reports that she does not use illicit drugs.   Family History:  The patient's family history includes Arthritis in her father; Hypertension in her mother; Stomach cancer in her father; Stroke in her mother.    ROS:  Please see the history of present illness.   Otherwise, review of  systems are positive for none.   All other systems are reviewed and negative.    PHYSICAL EXAM: VS:  BP 126/64 mmHg  Pulse 82  Ht 5' (1.524 m)  Wt 105 lb 6.4 oz (47.809 kg)  BMI 20.58 kg/m2 , BMI Body mass index is 20.58 kg/(m^2). GEN: Well nourished, well developed, in no acute distress HEENT: normal Neck: no JVD, carotid bruits, or masses Cardiac: Irregularly irregular rhythm.  Grade 3/6 systolic murmur.  Soft aortic insufficiency murmur.  No S3 gallop. Respiratory:  clear to auscultation bilaterally, normal work of breathing GI: soft, nontender, nondistended, + BS MS: no deformity or atrophy Skin: warm and dry, no rash Neuro:  Strength and sensation are intact Psych: euthymic mood, full affect   EKG:  EKG is ordered today. The ekg ordered today demonstrates atrial fibrillation with left axis deviation and left bundle-branch block.  Since previous EKG of 07/15/14, heart rate is slower.   Recent Labs: 06/14/2014: B Natriuretic Peptide 428.8* 07/16/2014: Magnesium 1.7 07/17/2014: Hemoglobin 11.0*; Platelets 168 09/04/2014: BUN 37*; Creatinine 1.35*; Potassium 3.5; Sodium 131*    Lipid Panel    Component Value Date/Time   CHOL 190 06/28/2010 1054   TRIG 71.0 06/28/2010 1054   HDL 84.10 06/28/2010 1054   CHOLHDL 2 06/28/2010 1054   VLDL 14.2 06/28/2010 1054   LDLCALC 92 06/28/2010 1054      Wt Readings from Last 3 Encounters:  09/04/14 105 lb 6.4 oz (47.809 kg)  07/16/14 101 lb 9.6 oz (46.085 kg)  06/27/14 103 lb (46.72 kg)        ASSESSMENT AND PLAN:  1. permanent atrial fibrillation not on anticoagulation because of patient preference (Jehovah's Witness) 2. multi-valvular heart disease with tricuspid regurgitation, pulmonary hypertension, aortic insufficiency, and mitral regurgitation. 3. Hypertensive heart disease 4. peripheral edema 5. normal left ventricular function by echocardiogram 06/08/14 with ejection fraction 50-55% and with LVH.  Plan: Continue current  medication. She wonders if she can add coenzyme Q10 over-the-counter and this will be okay.  We are also cutting back on her baby aspirin to just 3 days a week.  We are checking electrolytes today.  She will be rechecked in 4 months for office visit EKG and basal metabolic panel   Current medicines are reviewed at length with the patient today.  The patient does not have concerns regarding medicines.  The following changes have been made:  no change  Labs/ tests ordered today include:   Orders Placed This Encounter  Procedures  . Basic metabolic panel  . Basic metabolic panel  . EKG 12-Lead      Signed, Darlin Coco, MD  09/04/2014 6:18 PM    [facility] [address], [place], [place]  [address] Phone: [phone]; Fax: [phone]

## 2014-09-04 NOTE — Patient Instructions (Signed)
Will obtain labs today and call you with the results (BMET)  START COENZYME Q10 DAILY   DECREASE YOUR ASPIRIN TO Monday, Wednesday, AND Friday ONLY  Your physician wants you to follow-up in: 4 MONTH OV/BMET/EKG  You will receive a reminder letter in the mail two months in advance. If you don't receive a letter, please call our office to schedule the follow-up appointment.

## 2014-09-12 ENCOUNTER — Other Ambulatory Visit: Payer: Self-pay | Admitting: Cardiology

## 2014-09-24 ENCOUNTER — Telehealth: Payer: Self-pay | Admitting: Cardiology

## 2014-09-24 NOTE — Telephone Encounter (Signed)
New message     Pt C/O Shortness Of Breath: STAT if SOB developed within the last 24 hours or pt is noticeably SOB on the phone  1. Are you currently SOB (can you hear that pt is SOB on the phone)? Home Health   nurse calling    2. How long have you been experiencing SOB? Couple of weeks , today worse . Weight 103 / 104.   3. Are you SOB when sitting or when up moving around? Both - sob just talking to nurse.   4. Are you currently experiencing any other symptoms? Fatigue - constantly. Having to sleep on 2-3 pillow at night or in the recliner.     Vital signs today 120/80, 82, 22, 97 % RA 97.9

## 2014-09-24 NOTE — Telephone Encounter (Signed)
Notified [name] [name] that Dr. Mare Ferrari not going to make any changes since VS and Wt are stable.  Advised that if she seems to continue to decline to call office.  She understands and will notify Ms. Leh to continue same medications.

## 2014-09-24 NOTE — Telephone Encounter (Signed)
Tiffany, HHN, calling stating that Ms. Bergsma seems to be "going down hill" since her last hospitalization.  States she is very weak, can't do ADL, is SOB while talking and when walks short distance., can't sleep.  States she has referred pt to her PCP but he feels like her symptoms are heart related.  She has had some incontinence but he has done a urinalysis and was clear.  States she has had some pitted ankle edema but not any worse than what she has had.  Wt stable between 103-104 lbs. BP in normal range and HR has been in 80's-90. States her lungs are clear. States pt just "looks miserable" because she hasn't been able to sleep and is so weak. Is taking medications as prescribed.  Advised Dr. Mare Ferrari not in office today but will send message to him for any recommendations.

## 2014-09-24 NOTE — Telephone Encounter (Signed)
With weight and vital signs stable I would suggest continuing her same home meds.

## 2014-09-25 ENCOUNTER — Other Ambulatory Visit: Payer: Self-pay | Admitting: *Deleted

## 2014-09-25 MED ORDER — METOPROLOL SUCCINATE ER 25 MG PO TB24: 37.5000 mg | ORAL_TABLET | Freq: Every day | ORAL | Status: DC

## 2014-09-30 ENCOUNTER — Telehealth: Payer: Self-pay | Admitting: Cardiology

## 2014-09-30 NOTE — Telephone Encounter (Signed)
Will forward to  Dr. Brackbill for review 

## 2014-09-30 NOTE — Telephone Encounter (Signed)
Increase the Bumex to twice a day for the next 3 days.  Then go back to once a day if she gets rid of a lot of the extra fluid.  I agree with the increase of the spironolactone that her PCP advised.

## 2014-09-30 NOTE — Telephone Encounter (Signed)
New problem       Reporting pt has a weight gain 3.5 lbs she has 2 plus edema in feet and legs and increase sob. Pt has reported these symptoms to her PCP yesterday and they increased her spironolactone to 1/2 tablet twice a day starting yesterday and pt has gain 1/2 lb since last night. Please advise.

## 2014-10-01 NOTE — Telephone Encounter (Signed)
Spoke with patient yesterday afternoon and she stated she was feeling better and felt as though she had gotten rid of some fluid. Patient to keep medications as they were.  Called Tiffany this am to follow up on patient. Patients weight today down a pound but having shortness of breath with very little exertion. Called and spoke with patient and advised to increase Bumex to BID for 3 days, verbalized understanding Tiffany aware and will call back tomorrow after visit with update

## 2014-10-02 NOTE — Telephone Encounter (Signed)
Will forward to  Dr. Brackbill for review 

## 2014-10-02 NOTE — Telephone Encounter (Signed)
Agree with plan 

## 2014-10-02 NOTE — Telephone Encounter (Signed)
Follow up      FYI Pt is back to baseline wt---103-5.  Symptoms have improved.  Pt will go back to regular diuretic therapy schedule,

## 2014-10-10 ENCOUNTER — Telehealth: Payer: Self-pay | Admitting: Cardiology

## 2014-10-10 NOTE — Telephone Encounter (Signed)
New Message   Rn case manager for [facility] wanted to speak w/ Rn about ejection fraction and last BP and Heart rate. Please call back and discuss.

## 2014-10-13 NOTE — Telephone Encounter (Signed)
Left message to call back  

## 2014-10-22 NOTE — Telephone Encounter (Signed)
Never received call back

## 2014-11-04 ENCOUNTER — Telehealth: Payer: Self-pay | Admitting: Cardiology

## 2014-11-04 NOTE — Telephone Encounter (Signed)
New Message  Pt called for the EF factor and precentages of her heart. Last BP and Heart rate reading.

## 2014-11-04 NOTE — Telephone Encounter (Signed)
Last EF, heart rate, and blood pressure given to patient

## 2014-11-22 ENCOUNTER — Other Ambulatory Visit: Payer: Self-pay | Admitting: Cardiology

## 2014-12-04 ENCOUNTER — Telehealth: Payer: Self-pay | Admitting: Cardiology

## 2014-12-04 NOTE — Telephone Encounter (Signed)
New message     Calling to give report on pt congestive heart failure  Pt c/o Shortness Of Breath: STAT if SOB developed within the last 24 hours or pt is noticeably SOB on the phone  1. Are you currently SOB (can you hear that pt is SOB on the phone)? no  2. How long have you been experiencing SOB? 2 days  3. Are you SOB when sitting or when up moving around? Not sure  4. Are you currently experiencing any other symptoms? Tiredness  Olivia Mackie w/[employer] faxing order to [place],  If changes need to be made call pt.

## 2014-12-04 NOTE — Telephone Encounter (Signed)
Called Nurse Case Manager [name] [name] with [facility] and left message to call back.  Called patient about her SOB. Patient states she has been SOB for a couple of weeks. Patient did not sound SOB while resting, but states she gets SOB with minimal activity.  Patient denies any chest pain and does not feel that she needs to see anyone. Home health nurse was there yesterday and had patient take any extra Bumex. Patient's weight yesterday was 108.5 lbs, and today her weight is 106 lbs.  Patient was directed to take Bumex 1 mg BID from Nurse Case Manager [name] [name]. Informed patient that this is what she took in April when she had excess fluid. Encouraged patient to call the office if she doesn't feel any improvement after the extra dose of Bumex. Patient stated it helped some yesterday. Informed patient that this message would be sent to Dr. Mare Ferrari and his nurse and if there are any changes that the office will call her. Patient verbalized understanding.

## 2014-12-04 NOTE — Telephone Encounter (Signed)
Agree with advice given

## 2014-12-16 ENCOUNTER — Emergency Department (HOSPITAL_COMMUNITY)
Admission: EM | Admit: 2014-12-16 | Discharge: 2014-12-16 | Disposition: A | Discharge status: Home / Self Care | Payer: Medicare Other | Attending: Emergency Medicine | Admitting: Emergency Medicine

## 2014-12-16 ENCOUNTER — Encounter (HOSPITAL_COMMUNITY): Payer: Self-pay | Admitting: Emergency Medicine

## 2014-12-16 ENCOUNTER — Emergency Department (HOSPITAL_COMMUNITY): Payer: Medicare Other

## 2014-12-16 DIAGNOSIS — R011 Cardiac murmur, unspecified: Secondary | ICD-10-CM | POA: Insufficient documentation

## 2014-12-16 DIAGNOSIS — Y999 Unspecified external cause status: Secondary | ICD-10-CM | POA: Diagnosis not present

## 2014-12-16 DIAGNOSIS — Y92009 Unspecified place in unspecified non-institutional (private) residence as the place of occurrence of the external cause: Secondary | ICD-10-CM | POA: Diagnosis not present

## 2014-12-16 DIAGNOSIS — S0993XA Unspecified injury of face, initial encounter: Secondary | ICD-10-CM | POA: Diagnosis present

## 2014-12-16 DIAGNOSIS — Y9389 Activity, other specified: Secondary | ICD-10-CM | POA: Diagnosis not present

## 2014-12-16 DIAGNOSIS — S3992XA Unspecified injury of lower back, initial encounter: Secondary | ICD-10-CM | POA: Diagnosis not present

## 2014-12-16 DIAGNOSIS — E039 Hypothyroidism, unspecified: Secondary | ICD-10-CM | POA: Insufficient documentation

## 2014-12-16 DIAGNOSIS — Z87828 Personal history of other (healed) physical injury and trauma: Secondary | ICD-10-CM | POA: Diagnosis not present

## 2014-12-16 DIAGNOSIS — W109XXA Fall (on) (from) unspecified stairs and steps, initial encounter: Secondary | ICD-10-CM | POA: Diagnosis not present

## 2014-12-16 DIAGNOSIS — Z87891 Personal history of nicotine dependence: Secondary | ICD-10-CM | POA: Insufficient documentation

## 2014-12-16 DIAGNOSIS — M545 Low back pain: Secondary | ICD-10-CM

## 2014-12-16 DIAGNOSIS — W19XXXA Unspecified fall, initial encounter: Secondary | ICD-10-CM

## 2014-12-16 DIAGNOSIS — Z79899 Other long term (current) drug therapy: Secondary | ICD-10-CM | POA: Diagnosis not present

## 2014-12-16 DIAGNOSIS — S0083XA Contusion of other part of head, initial encounter: Secondary | ICD-10-CM

## 2014-12-16 DIAGNOSIS — S0012XA Contusion of left eyelid and periocular area, initial encounter: Secondary | ICD-10-CM | POA: Insufficient documentation

## 2014-12-16 DIAGNOSIS — S20221A Contusion of right back wall of thorax, initial encounter: Secondary | ICD-10-CM | POA: Diagnosis not present

## 2014-12-16 NOTE — ED Notes (Signed)
Patient transported to X-ray 

## 2014-12-16 NOTE — ED Notes (Signed)
MD at bedside. 

## 2014-12-16 NOTE — ED Provider Notes (Signed)
CSN: [id number]     Arrival date & time 12/16/14  1822 History   First MD Initiated Contact with Patient 12/16/14 1959     Chief Complaint  Patient presents with  . Fall     (Consider location/radiation/quality/duration/timing/severity/associated sxs/prior Treatment) HPI  [name] [name] is a 81 y.o. female who presents for evaluation of injuries from multiple falls. Her last fall was 2 days ago when she was sitting on some steps,  and fell forward striking the wall.  because she went to sleep. The fall caused her to wake up, whereupon she sat back up. She also fell, 3 days ago when she was sitting on the bed, stood up, and tripped because it was dark. At that time she hurt her right shoulder. She has ongoing chronic tight feeling in her lower back. She went to see a chiropractor, last week, about the low back pain. He advised her to get x-rays before he would do manipulation and that her insurance would not pay for x-rays, if he did them. She would like back x-rays here. She denies recent fever, chills, nausea, vomiting, cough, shortness of breath, chest pain, or change in bowel or urinary habits. She states that she saw her PCP today, who sent her here, and recommended imaging with CT scan and x-rays. There are no other known modifying factors.  Past Medical History  Diagnosis Date  . Mitral valve disease   . Mitral regurgitation   . Hypothyroidism   . B12 deficiency   . Heart murmur   . Allergy   . OA (osteoarthritis)   . Migraines   . Atrial fibrillation     pt declines anticoag (jehovah's witness)  . Cardiomegaly     on CT scan 03/2011   Past Surgical History  Procedure Laterality Date  . Tonsillectomy    . Nasal endoscopy with epistaxis control  07/15/2012    Procedure: NASAL ENDOSCOPY WITH EPISTAXIS CONTROL;  Surgeon: Izora Gala, MD;  Location: [facility];  Service: ENT;  Laterality: N/A;   Family History  Problem Relation Age of Onset  . Stroke Mother   . Arthritis Father    . Hypertension Mother   . Stomach cancer Father    History  Substance Use Topics  . Smoking status: Former Research scientist (life sciences)  . Smokeless tobacco: Never Used  . Alcohol Use: Yes     Comment: Social   OB History    No data available     Review of Systems  All other systems reviewed and are negative.     Allergies  Sulfa antibiotics; Amlodipine; Cozaar; Edecrin; Lasix; Other; Ramipril; and Hctz  Home Medications   Prior to Admission medications   Medication Sig Start Date End Date Taking? Authorizing Provider  aspirin 81 MG tablet Take 81 mg by mouth as directed. ONE TABLET Monday, Wednesday, AND Friday ONLY    Historical Provider, MD  B Complex Vitamins (VITAMIN-B COMPLEX PO) Take by mouth.    Historical Provider, MD  bumetanide (BUMEX) 1 MG tablet TAKE 1 TABLET BY MOUTH DAILY 07/16/14   Darlin Coco, MD  CARTIA XT 120 MG 24 hr capsule TAKE 1 TABLET BY MOUTH DAILY 09/12/14   Darlin Coco, MD  cholecalciferol (VITAMIN D) 1000 UNITS tablet Take 1,000 Units by mouth daily.    Historical Provider, MD  Coenzyme Q10 (COQ10 PO) Take by mouth daily.    Historical Provider, MD  docusate sodium (COLACE) 100 MG capsule Take 100 mg by mouth daily as needed for mild  constipation.    Historical Provider, MD  latanoprost (XALATAN) 0.005 % ophthalmic solution Place 1 drop into both eyes daily.  04/16/14   Historical Provider, MD  levothyroxine (SYNTHROID, LEVOTHROID) 50 MCG tablet TAKE ONE TABLET BY MOUTH ONE TIME DAILY 07/09/11   Rowe Clack, MD  metoprolol succinate (TOPROL-XL) 25 MG 24 hr tablet TAKE 1 AND 1/2 TABLETS(37.5 MG) BY MOUTH DAILY 11/24/14   Darlin Coco, MD  sodium chloride (OCEAN) 0.65 % SOLN nasal spray Place 1 spray into both nostrils at bedtime as needed for congestion.    Historical Provider, MD  spironolactone (ALDACTONE) 25 MG tablet Take 12.5 mg by mouth every other day.     Historical Provider, MD   BP 174/98 mmHg  Pulse 95  Temp(Src) 98 F (36.7 C) (Oral)  Resp  16  Ht 5' (1.524 m)  Wt 104 lb 4.8 oz (47.31 kg)  BMI 20.37 kg/m2  SpO2 97% Physical Exam  Constitutional: She is oriented to person, place, and time. She appears well-developed.  Frail, elderly.  HENT:  Head: Normocephalic.  Right Ear: External ear normal.  Left Ear: External ear normal.  No injury to scalp. No crepitation of cranium. Left periorbital tissue, with flat, ecchymosis, more medial than lateral. No associated crepitation or deformity. No midface tenderness. Normal range of motion. TMJs bilaterally.  Eyes: Conjunctivae and EOM are normal. Pupils are equal, round, and reactive to light.  Neck: Normal range of motion and phonation normal. Neck supple.  Cardiovascular: Normal rate, regular rhythm and normal heart sounds.   Pulmonary/Chest: Effort normal and breath sounds normal. No respiratory distress. She exhibits no tenderness and no bony tenderness.  No deformity or crepitation of the chest wall  Abdominal: Soft. There is no tenderness.  Musculoskeletal: Normal range of motion.  Normal range of motion, arms and legs bilaterally. No limitation of movement of either scapula. Very small bruise noted over the right upper parathoracic vertebrae. She has moderate kyphosis of the thoracic spine. Normal range of motion, lumbar spine. No tenderness of the paralumbar musculature.  Neurological: She is alert and oriented to person, place, and time. No cranial nerve deficit or sensory deficit. She exhibits normal muscle tone. Coordination normal.  Skin: Skin is warm, dry and intact.  Psychiatric: She has a normal mood and affect. Her behavior is normal. Judgment and thought content normal.  Nursing note and vitals reviewed.   ED Course  Procedures (including critical care time)  Medications - No data to display  Patient Vitals for the past 24 hrs:  BP Temp Temp src Pulse Resp SpO2 Height Weight  12/16/14 1831 174/98 mmHg 98 F (36.7 C) Oral 95 16 97 % 5' (1.524 m) -  12/16/14  1829 - - - - - - - 104 lb 4.8 oz (47.31 kg)    8:26 PM Reevaluation with update and discussion. After initial assessment and treatment, an updated evaluation reveals no additional c/o. There was no documentation from her PCP about concerns or directions for evaluation. [place] Review Labs Reviewed - No data to display  Imaging Review No results found.   EKG Interpretation None      MDM   Final diagnoses:  Fall, initial encounter  Contusion of face, initial encounter  Low back pain without sciatica, unspecified back pain laterality    Fall, without serious injury. Doubt facial fracture, scapular fracture, long bone fracture or new spine fracture or problem.  Nursing Notes Reviewed/ Care Coordinated Applicable  Imaging Reviewed Interpretation of Laboratory Data incorporated into ED treatment  The patient appears reasonably screened and/or stabilized for discharge and I doubt any other medical condition or other [facility] requiring further screening, evaluation, or treatment in the ED at this time prior to discharge.  Plan: Home Medications- usual; Home Treatments- rest; return here if the recommended treatment, does not improve the symptoms; Recommended follow up- PCP prn     Daleen Bo, MD 12/16/14 2157

## 2014-12-16 NOTE — ED Notes (Signed)
Pt from home for eval of fall x2 days ago, pt states she fell asleep sitting on the stairs in her house when she started to fall forward. Pt states she woke up and tried to catch herself but landed on her face. Pt noted to have bruising to left eye, no LOC reported by pt. Pt axo x4. Pt denies any pain at this time. nad noted.

## 2014-12-16 NOTE — Discharge Instructions (Signed)
Back Pain, Adult °Low back pain is very common. About 1 in 5 people have back pain. The cause of low back pain is rarely dangerous. The pain often gets better over time. About half of people with a sudden onset of back pain feel better in just 2 weeks. About 8 in 10 people feel better by 6 weeks.  °CAUSES °Some common causes of back pain include: °· Strain of the muscles or ligaments supporting the spine. °· Wear and tear (degeneration) of the spinal discs. °· Arthritis. °· Direct injury to the back. °DIAGNOSIS °Most of the time, the direct cause of low back pain is not known. However, back pain can be treated effectively even when the exact cause of the pain is unknown. Answering your caregiver's questions about your overall health and symptoms is one of the most accurate ways to make sure the cause of your pain is not dangerous. If your caregiver needs more information, he or she may order lab work or imaging tests (X-rays or MRIs). However, even if imaging tests show changes in your back, this usually does not require surgery. °HOME CARE INSTRUCTIONS °For many people, back pain returns. Since low back pain is rarely dangerous, it is often a condition that people can learn to manage on their own.  °· Remain active. It is stressful on the back to sit or stand in one place. Do not sit, drive, or stand in one place for more than 30 minutes at a time. Take short walks on level surfaces as soon as pain allows. Try to increase the length of time you walk each day. °· Do not stay in bed. Resting more than 1 or 2 days can delay your recovery. °· Do not avoid exercise or work. Your body is made to move. It is not dangerous to be active, even though your back may hurt. Your back will likely heal faster if you return to being active before your pain is gone. °· Pay attention to your body when you  bend and lift. Many people have less discomfort when lifting if they bend their knees, keep the load close to their bodies, and  avoid twisting. Often, the most comfortable positions are those that put less stress on your recovering back. °· Find a comfortable position to sleep. Use a firm mattress and lie on your side with your knees slightly bent. If you lie on your back, put a pillow under your knees. °· Only take over-the-counter or prescription medicines as directed by your caregiver. Over-the-counter medicines to reduce pain and inflammation are often the most helpful. Your caregiver may prescribe muscle relaxant drugs. These medicines help dull your pain so you can more quickly return to your normal activities and healthy exercise. °· Put ice on the injured area. °¨ Put ice in a plastic bag. °¨ Place a towel between your skin and the bag. °¨ Leave the ice on for 15-20 minutes, 03-04 times a day for the first 2 to 3 days. After that, ice and heat may be alternated to reduce pain and spasms. °· Ask your caregiver about trying back exercises and gentle massage. This may be of some benefit. °· Avoid feeling anxious or stressed. Stress increases muscle tension and can worsen back pain. It is important to recognize when you are anxious or stressed and learn ways to manage it. Exercise is a great option. °SEEK MEDICAL CARE IF: °· You have pain that is not relieved with rest or medicine. °· You have pain that does not improve in 1 week. °· You have new symptoms. °· You are generally not feeling well. °SEEK   IMMEDIATE MEDICAL CARE IF:   You have pain that radiates from your back into your legs.  You develop new bowel or bladder control problems.  You have unusual weakness or numbness in your arms or legs.  You develop nausea or vomiting.  You develop abdominal pain.  You feel faint. Document Released: 05/30/2005 Document Revised: 11/29/2011 Document Reviewed: 10/01/2013 [facility] Patient Information 2015 [place], [place]. This information is not intended to replace advice given to you by your health care provider. Make sure you  discuss any questions you have with your health care provider.  Contusion A contusion is a deep bruise. Contusions are the result of an injury that caused bleeding under the skin. The contusion may turn blue, purple, or yellow. Minor injuries will give you a painless contusion, but more severe contusions may stay painful and swollen for a few weeks.  CAUSES  A contusion is usually caused by a blow, trauma, or direct force to an area of the body. SYMPTOMS   Swelling and redness of the injured area.  Bruising of the injured area.  Tenderness and soreness of the injured area.  Pain. DIAGNOSIS  The diagnosis can be made by taking a history and physical exam. An X-ray, CT scan, or MRI may be needed to determine if there were any associated injuries, such as fractures. TREATMENT  Specific treatment will depend on what area of the body was injured. In general, the best treatment for a contusion is resting, icing, elevating, and applying cold compresses to the injured area. Over-the-counter medicines may also be recommended for pain control. Ask your caregiver what the best treatment is for your contusion. HOME CARE INSTRUCTIONS   Put ice on the injured area.  Put ice in a plastic bag.  Place a towel between your skin and the bag.  Leave the ice on for 15-20 minutes, 3-4 times a day, or as directed by your health care provider.  Only take over-the-counter or prescription medicines for pain, discomfort, or fever as directed by your caregiver. Your caregiver may recommend avoiding anti-inflammatory medicines (aspirin, ibuprofen, and naproxen) for 48 hours because these medicines may increase bruising.  Rest the injured area.  If possible, elevate the injured area to reduce swelling. SEEK IMMEDIATE MEDICAL CARE IF:   You have increased bruising or swelling.  You have pain that is getting worse.  Your swelling or pain is not relieved with medicines. MAKE SURE YOU:   Understand these  instructions.  Will watch your condition.  Will get help right away if you are not doing well or get worse. Document Released: 03/09/2005 Document Revised: 06/04/2013 Document Reviewed: 04/04/2011 [facility] Patient Information 2015 [place], [place]. This information is not intended to replace advice given to you by your health care provider. Make sure you discuss any questions you have with your health care provider.      DG Thoracic Spine 2 View (Final result) Result time: 12/16/14 21:32:00   Final result by Rad Results In Interface (12/16/14 21:32:00)   Narrative:   CLINICAL DATA: Back pain, fall down stairs at home 2 days ago  EXAM: THORACIC SPINE - 2-3 VIEWS  COMPARISON: 06/07/2014  FINDINGS: Three views of thoracic spine submitted. There is diffuse osteopenia. No acute fracture or subluxation.  IMPRESSION: Diffuse osteopenia. No acute fracture or subluxation   Electronically Signed By: Lahoma Crocker M.D. On: 12/16/2014 21:32          DG Lumbar Spine Complete (Final result) Result time: 12/16/14 21:34:26   Final  result by Rad Results In Interface (12/16/14 21:34:26)   Narrative:   CLINICAL DATA: Back pain, fall at home 2 days ago  EXAM: LUMBAR SPINE - COMPLETE 4+ VIEW  COMPARISON: 04/01/2011  FINDINGS: Five views of lumbar spine submitted. Study is limited by diffuse osteopenia. Again noted levoscoliosis upper lumbar spine. There is disc space flattening with right lateral osteophytes at L1-L2 level. Disc space flattening with left lateral osteophytes noted at L2-L3 level. Significant disc space flattening with vacuum disc phenomenon and left lateral osteophytes at L3-L4 level. Disc space flattening at L4-L5 level. Facet degenerative changes L4 and L5 level. Diffuse osteopenia. No acute fracture or subluxation.  IMPRESSION: No acute fracture or subluxation. Diffuse osteopenia. There is levoscoliosis upper lumbar spine again noted.  Multilevel degenerative changes as described above.   Electronically Signed By: Lahoma Crocker M.D. On: 12/16/2014 21:34

## 2014-12-22 ENCOUNTER — Other Ambulatory Visit: Payer: Self-pay | Admitting: Family Medicine

## 2014-12-22 DIAGNOSIS — IMO0001 Reserved for inherently not codable concepts without codable children: Secondary | ICD-10-CM

## 2014-12-22 DIAGNOSIS — K219 Gastro-esophageal reflux disease without esophagitis: Principal | ICD-10-CM

## 2014-12-30 ENCOUNTER — Ambulatory Visit
Admission: RE | Admit: 2014-12-30 | Discharge: 2014-12-30 | Disposition: A | Discharge status: Home / Self Care | Payer: Medicare Other | Source: Ambulatory Visit | Attending: Family Medicine | Admitting: Family Medicine

## 2014-12-30 DIAGNOSIS — IMO0001 Reserved for inherently not codable concepts without codable children: Secondary | ICD-10-CM

## 2014-12-30 DIAGNOSIS — K219 Gastro-esophageal reflux disease without esophagitis: Principal | ICD-10-CM

## 2015-01-06 ENCOUNTER — Encounter: Payer: Self-pay | Admitting: Cardiology

## 2015-01-06 ENCOUNTER — Ambulatory Visit (INDEPENDENT_AMBULATORY_CARE_PROVIDER_SITE_OTHER): Payer: Medicare Other | Admitting: Cardiology

## 2015-01-06 VITALS — BP 126/82 | HR 72 | Ht 60.0 in | Wt 109.0 lb

## 2015-01-06 DIAGNOSIS — I340 Nonrheumatic mitral (valve) insufficiency: Secondary | ICD-10-CM

## 2015-01-06 DIAGNOSIS — I5033 Acute on chronic diastolic (congestive) heart failure: Secondary | ICD-10-CM | POA: Diagnosis not present

## 2015-01-06 DIAGNOSIS — I482 Chronic atrial fibrillation, unspecified: Secondary | ICD-10-CM

## 2015-01-06 NOTE — Patient Instructions (Signed)
Medication Instructions:  Your physician recommends that you continue on your current medications as directed. Please refer to the Current Medication list given to you today.  Labwork: NONE  Testing/Procedures: NONE3  Follow-Up: Your physician recommends that you schedule a follow-up appointment in: [place]

## 2015-01-06 NOTE — Progress Notes (Signed)
Cardiology Office Note   Date:  01/06/2015   ID:  [name] [name], DOB January 21, 1930, MRN [id number]  PCP:  Lilian Coma, MD  Cardiologist: Darlin Coco MD  No chief complaint on file.     History of Present Illness: [name] [name] is a 81 y.o. female who presents for scheduled follow-up visit  [name] [name] is a 81 y.o. female who presents for a four-month follow-up office visit This pleasant 81 year old woman is seen for followup of her chronic atrial fibrillation. She is a medical patient of Dr. Stephanie Acre. She has a history of chronic atrial fibrillation and essential hypertension. She also has pulmonary hypertension. She is a Restaurant manager, fast food. She refuses anticoagulation for her atrial fibrillation because she is concerned about bleeding. The patient was hospitalized at the end of December 2015 after having severe epistaxis. Her echocardiogram on 06/08/14 showed an ejection fraction of 50-55% with LVH. She was found to have mild to moderate aortic insufficiency, moderate to severe mitral regurgitation, and severe tricuspid regurgitation with pulmonary artery pressure of 64. She also had marked biatrial enlargement. She should be on long-term anticoagulation but she declines anticoagulation. I spoke with her about Eliquis and she does not want to be on anticoagulation. Part of her hesitancy is the fact that she has a concern about blood transfusions because of her religion. On her last hospitalization she had a slight bump in her troponins. She remains on low-dose aspirin 81 mg daily. She has not had any recurrent epistaxis. Since last visit her breathing has been a little better.  She states that she has good days and bad days.  Some days her energy level is quite low.  Since last visit she has gained 4 pounds.  On 7/19 she had a swallowing test which showed only trace reflux but she does have esophageal hypomotility problems.  Past Medical History  Diagnosis Date    . Mitral valve disease   . Mitral regurgitation   . Hypothyroidism   . B12 deficiency   . Heart murmur   . Allergy   . OA (osteoarthritis)   . Migraines   . Atrial fibrillation     pt declines anticoag (jehovah's witness)  . Cardiomegaly     on CT scan 03/2011    Past Surgical History  Procedure Laterality Date  . Tonsillectomy    . Nasal endoscopy with epistaxis control  07/15/2012    Procedure: NASAL ENDOSCOPY WITH EPISTAXIS CONTROL;  Surgeon: Izora Gala, MD;  Location: [facility];  Service: ENT;  Laterality: N/A;     Current Outpatient Prescriptions  Medication Sig Dispense Refill  . aspirin 81 MG tablet Take 81 mg by mouth. Mondays, Wednesdays, and Fridays    . B Complex Vitamins (VITAMIN-B COMPLEX PO) Take 1 capsule by mouth daily.     . bumetanide (BUMEX) 1 MG tablet TAKE 1 TABLET BY MOUTH DAILY 30 tablet 6  . CARTIA XT 120 MG 24 hr capsule TAKE 1 TABLET BY MOUTH DAILY 30 capsule 3  . cholecalciferol (VITAMIN D) 1000 UNITS tablet Take 1,000 Units by mouth daily.    . Coenzyme Q10 (COQ10 PO) Take 1 capsule by mouth daily.     Marland Kitchen latanoprost (XALATAN) 0.005 % ophthalmic solution Place 1 drop into both eyes at bedtime.     Marland Kitchen levothyroxine (SYNTHROID, LEVOTHROID) 50 MCG tablet TAKE ONE TABLET BY MOUTH ONE TIME DAILY 30 tablet 5  . metoprolol succinate (TOPROL-XL) 25 MG 24 hr tablet TAKE 1 AND 1/2 TABLETS(37.5 MG)  BY MOUTH DAILY 135 tablet 0  . sodium chloride (OCEAN) 0.65 % SOLN nasal spray Place 1 spray into both nostrils at bedtime as needed for congestion.    Marland Kitchen spironolactone (ALDACTONE) 25 MG tablet Take 12.5 mg by mouth every other day.      No current facility-administered medications for this visit.    Allergies:   Sulfa antibiotics; Amlodipine; Cozaar; Edecrin; Lasix; Other; Ramipril; and Hctz    Social History:  The patient  reports that she has quit smoking. She has never used smokeless tobacco. She reports that she drinks alcohol. She reports that she does not use  illicit drugs.   Family History:  The patient's family history includes Arthritis in her father; Hypertension in her mother; Stomach cancer in her father; Stroke in her mother.    ROS:  Please see the history of present illness.   Otherwise, review of systems are positive for none.   All other systems are reviewed and negative.    PHYSICAL EXAM: VS:  BP 126/82 mmHg  Pulse 72  Ht 5' (1.524 m)  Wt 109 lb (49.442 kg)  BMI 21.29 kg/m2 , BMI Body mass index is 21.29 kg/(m^2). GEN: Well nourished, well developed, in no acute distress HEENT: normal Neck: The jugular venous pressure is elevated. Cardiac: Irregularly irregular rhythm.  Grade 2/6 apical systolic murmur of mitral regurgitation.  Low pitched diastolic murmur of aortic insufficiency.  There is marked bilateral pitting edema. Respiratory:  clear to auscultation bilaterally, normal work of breathing GI: soft, nontender, nondistended, + BS MS: no deformity or atrophy Skin: warm and dry, no rash Neuro:  Strength and sensation are intact Psych: euthymic mood, full affect   EKG:  EKG is ordered today. The ekg ordered today demonstrates atrial fibrillation with controlled ventricular response of 72 bpm.  There is right superior axis deviation with nonspecific interventricular block.  Since previous tracing of 09/04/14, the rate is slower.   Recent Labs: 06/14/2014: B Natriuretic Peptide 428.8* 07/16/2014: Magnesium 1.7 07/17/2014: Hemoglobin 11.0*; Platelets 168 09/04/2014: BUN 37*; Creatinine, Ser 1.35*; Potassium 3.5; Sodium 131*    Lipid Panel    Component Value Date/Time   CHOL 190 06/28/2010 1054   TRIG 71.0 06/28/2010 1054   HDL 84.10 06/28/2010 1054   CHOLHDL 2 06/28/2010 1054   VLDL 14.2 06/28/2010 1054   LDLCALC 92 06/28/2010 1054      Wt Readings from Last 3 Encounters:  01/06/15 109 lb (49.442 kg)  12/16/14 104 lb 4.8 oz (47.31 kg)  09/04/14 105 lb 6.4 oz (47.809 kg)        ASSESSMENT AND PLAN:  1.  permanent atrial fibrillation not on anticoagulation because of patient preference (Jehovah's Witness) 2. multi-valvular heart disease with tricuspid regurgitation, pulmonary hypertension, aortic insufficiency, and mitral regurgitation. 3. Hypertensive heart disease 4. peripheral edema, chronic 5. normal left ventricular function by echocardiogram 06/08/14 with ejection fraction 50-55% and with LVH. 6.  Chronic renal insufficiency  Plan: Continue current medication.   Recheck in 4 months for follow-up office visit   Current medicines are reviewed at length with the patient today. The patient does not have concerns regarding medicines.  The following changes have been made: no change   Current medicines are reviewed at length with the patient today.  The patient does not have concerns regarding medicines.  The following changes have been made:  no change  Labs/ tests ordered today include:   Orders Placed This Encounter  Procedures  . EKG 12-Lead  Berna Spare MD 01/06/2015 6:55 PM    [address], [place], [place]  [address] Phone: [phone]; Fax: [phone]

## 2015-01-07 ENCOUNTER — Other Ambulatory Visit: Payer: Self-pay | Admitting: Cardiology

## 2015-01-07 ENCOUNTER — Telehealth: Payer: Self-pay | Admitting: Cardiology

## 2015-01-07 NOTE — Telephone Encounter (Signed)
New message      Nurse states pt weight has increased Weight on July 20th was 104.4 Weight today is 106.8 Nurse states pt has a couple of questions that where not addressed yesterday at her appointment Please call to address

## 2015-01-07 NOTE — Telephone Encounter (Signed)
In view of weight gain I would recommend that she increase her bumex 1 mg to twice a day for the next 3 days then go back to original dose.

## 2015-01-07 NOTE — Telephone Encounter (Signed)
Will forward to  Dr. Brackbill for review 

## 2015-01-08 NOTE — Telephone Encounter (Signed)
Spoke with patient and her weight today is down to 105.4 Advised to take the Bumex twice today and call back in the morning with an updated weight, verbalized understanding

## 2015-01-09 ENCOUNTER — Other Ambulatory Visit: Payer: Self-pay

## 2015-01-09 MED ORDER — SPIRONOLACTONE 25 MG PO TABS: 12.5000 mg | ORAL_TABLET | ORAL | Status: DC

## 2015-01-09 NOTE — Telephone Encounter (Signed)
Spoke with patient and she states wt 105 today but she is constipated She will take another Bumex today and will call back if has any more issue with weight gain

## 2015-01-09 NOTE — Telephone Encounter (Signed)
Follow up ° ° °Pt returning your call °

## 2015-01-09 NOTE — Telephone Encounter (Signed)
Patient did not call this am, left message to call back

## 2015-01-23 ENCOUNTER — Telehealth: Payer: Self-pay | Admitting: Cardiology

## 2015-01-23 NOTE — Telephone Encounter (Signed)
New message      Pt states fluid in legs is "leaking" through a sore Pt wanting to how to treat Please call to discuss

## 2015-01-23 NOTE — Telephone Encounter (Signed)
Would suggest that she see her PCP about the oozing sore.

## 2015-01-23 NOTE — Telephone Encounter (Signed)
Patient phoned c/o sore that has been "oozing" for the last couple of 2 weeks Decreased energy and thinks she is having shortness of breath  No change in weight, leg swelling may be down a little Patient states she just does not feel good but does have problems all the time with constipation and could be coming from that.  Will forward to  Dr. Mare Ferrari for review

## 2015-01-23 NOTE — Telephone Encounter (Signed)
Advised patient, verbalized understanding  

## 2015-01-29 ENCOUNTER — Other Ambulatory Visit: Payer: Self-pay | Admitting: Cardiology

## 2015-02-05 ENCOUNTER — Telehealth: Payer: Self-pay | Admitting: Cardiology

## 2015-02-05 NOTE — Telephone Encounter (Signed)
Weight has been staying around 104 and up to 107 Swelling in legs a little worse today Today is a Spironolactone day Would like to know if she should take an extra Bumex today and have ranges if she could take an extra Will forward to  Dr. Mare Ferrari for review

## 2015-02-05 NOTE — Telephone Encounter (Signed)
New message     Pt c/o swelling: STAT is pt has developed SOB within 24 hours  1. How long have you been experiencing swelling? This week  2. Where is the swelling located? Weight gain  3.  Are you currently taking a "fluid pill"? bumetanide?  4.  Are you currently SOB? Not really  5.  Have you traveled recently?No Pt weight is 107 today Pt wanting to know if she needs to increase her medication

## 2015-02-05 NOTE — Telephone Encounter (Signed)
Advised patient, verbalized understanding  

## 2015-02-05 NOTE — Telephone Encounter (Signed)
Yes, taken at her Bumex today.  Parameters can be to take an extra Bumex if her weight is up 2 pounds over her usual baseline

## 2015-02-10 ENCOUNTER — Encounter: Payer: Self-pay | Admitting: Cardiology

## 2015-02-12 ENCOUNTER — Telehealth: Payer: Self-pay | Admitting: Cardiology

## 2015-02-12 NOTE — Telephone Encounter (Signed)
New Message  Pt is being dc from [facility] and being setup w/ Telemetry. RN requested to speak w/ Rip Harbour. Please call back and discuss.

## 2015-02-12 NOTE — Telephone Encounter (Signed)
Spoke with [facility] regarding patient and PCP wanted her to consider Hospice  Discussed with  Dr. Mare Ferrari and no significant change in her

## 2015-02-12 NOTE — Telephone Encounter (Signed)
Spoke with patient and she declined hospice, does not feel she needs   Did offer her a sooner appointment with  Dr. Mare Ferrari, refused, thinks she is stable

## 2015-02-17 ENCOUNTER — Telehealth: Payer: Self-pay | Admitting: Cardiology

## 2015-02-17 NOTE — Telephone Encounter (Signed)
New message    Pt c/o swelling: STAT is pt has developed SOB within 24 hours  1. How long have you been experiencing swelling? Several days  2. Where is the swelling located? Both legs  3.  Are you currently taking a "fluid pill"? Bumex  4.  Are you currently SOB? No  5.  Have you traveled recently?No  Pt also has a couple questions

## 2015-02-17 NOTE — Telephone Encounter (Signed)
Left message to call back  

## 2015-02-17 NOTE — Telephone Encounter (Signed)
Would increase spironolactone to half every day.

## 2015-02-17 NOTE — Telephone Encounter (Signed)
Spoke with patient and her baseline weight 104 and up to 109 Per patient breathing not bad but not as good as it is at baseline weight  Denies any increased swelling and has been eating more sweets Patient has been taking Bumex twice a day and is taking her Spironolactone 25 mg 1/2 every other day Also, patient wanted to let  Dr. Mare Ferrari know that her PCP wants her to start getting her legs wrapped and has placed order for that  Will forward to  Dr. Mare Ferrari for review

## 2015-02-18 NOTE — Telephone Encounter (Signed)
Advised patient, verbalized understanding  

## 2015-02-23 ENCOUNTER — Telehealth: Payer: Self-pay | Admitting: Cardiology

## 2015-02-23 NOTE — Telephone Encounter (Signed)
NewMessage  Pt wanted to speak w/ RN- pt stated the fluid is till building up. Please call back and discuss.

## 2015-02-23 NOTE — Telephone Encounter (Signed)
Spoke with patient and she has had a lot of swelling in LE  Discussed with  Dr. Mare Ferrari and will have patient take extra Bumex x 2 days and check bmet next week Call back if no improvement  Advised patient, verbalized understanding

## 2015-03-03 ENCOUNTER — Other Ambulatory Visit: Payer: Medicare Other

## 2015-03-05 ENCOUNTER — Telehealth: Payer: Self-pay | Admitting: Cardiology

## 2015-03-05 NOTE — Telephone Encounter (Signed)
Spoke with patient and nurse out today to wrap legs  Not much change in weight  Will follow up with patient tomorrow to see how she is doing

## 2015-03-05 NOTE — Telephone Encounter (Signed)
Pt moed lab work to Friday wanted to let you know-also has question to run by you-didn't want to tell me

## 2015-03-06 ENCOUNTER — Other Ambulatory Visit: Payer: Self-pay | Admitting: Cardiology

## 2015-03-06 ENCOUNTER — Other Ambulatory Visit (INDEPENDENT_AMBULATORY_CARE_PROVIDER_SITE_OTHER): Payer: Medicare Other

## 2015-03-06 DIAGNOSIS — I10 Essential (primary) hypertension: Secondary | ICD-10-CM

## 2015-03-06 DIAGNOSIS — I160 Hypertensive urgency: Secondary | ICD-10-CM

## 2015-03-06 LAB — BASIC METABOLIC PANEL
BUN: 38 mg/dL — ABNORMAL HIGH (ref 6–23)
CALCIUM: 9.5 mg/dL (ref 8.4–10.5)
CHLORIDE: 89 meq/L — AB (ref 96–112)
CO2: 34 mEq/L — ABNORMAL HIGH (ref 19–32)
Creatinine, Ser: 1.66 mg/dL — ABNORMAL HIGH (ref 0.40–1.20)
GFR: 31.32 mL/min — ABNORMAL LOW (ref >60.00)
GLUCOSE: 94 mg/dL (ref 70–99)
Potassium: 3.6 mEq/L (ref 3.5–5.1)
SODIUM: 132 meq/L — AB (ref 135–145)

## 2015-03-06 NOTE — Telephone Encounter (Signed)
Spoke with patient and swelling in her legs is down a little from yesterday Patient will continue to monitor and call back if any changes

## 2015-03-06 NOTE — Telephone Encounter (Signed)
Please advise on how this should be sent in. Thanks, MI

## 2015-03-06 NOTE — Telephone Encounter (Signed)
Rx sent to pharmacy   

## 2015-03-09 ENCOUNTER — Other Ambulatory Visit: Payer: Self-pay | Admitting: Family Medicine

## 2015-03-09 DIAGNOSIS — W19XXXA Unspecified fall, initial encounter: Secondary | ICD-10-CM

## 2015-03-10 ENCOUNTER — Telehealth: Payer: Self-pay | Admitting: *Deleted

## 2015-03-10 NOTE — Telephone Encounter (Signed)
Agree with advice given

## 2015-03-10 NOTE — Telephone Encounter (Signed)
Advised patient, verbalized understanding Patient is currently only taking Bumex daily and an extra as needed for weight gain. Only took one Bumex today and does not think that her breathing is as good as it has been, swelling about the same. Advised to take an extra Bumex today and tomorrow, call back with update Thursday. If breathing worse go to ED. Patient verbalized understanding

## 2015-03-10 NOTE — Telephone Encounter (Signed)
-----   Message from Darlin Coco, MD sent at 03/10/2015  9:59 AM EDT ----- BUN about the same.  Creatinine higher because she is requiring more diuretic to handle fluid retention. Potassium is okay on spironolactone. CSD.

## 2015-03-11 NOTE — Telephone Encounter (Signed)
I suggest she take an extra dose of bumex today and tomorrow.

## 2015-03-11 NOTE — Telephone Encounter (Addendum)
Patient's home health nurse Levada Dy saw patient today. Levada Dy stated patient was SOB and O2 at 89% with activity. Patient O2 came up to  94% with rest. Patient's weight yesterday was 111lbs, and now 110lbs. Patient has some edema on BLE, and her swelling has gone down some. Patient has had some improvement with the extra dose of Bumex, but still states she feels bad. Will forward to Dr. Mare Ferrari for further recommendations. Home Health nurse is aware that Dr. Mare Ferrari is not in the office today, and she is also waiting for a call back from patient's PCP.

## 2015-03-11 NOTE — Telephone Encounter (Signed)
Follow up      Calling to talk to the nurse to give an update on pt

## 2015-03-11 NOTE — Telephone Encounter (Signed)
Called patient about Dr. Sherryl Barters recommendation. Patient verbalized understanding.

## 2015-03-12 ENCOUNTER — Telehealth: Payer: Self-pay | Admitting: Cardiology

## 2015-03-12 ENCOUNTER — Other Ambulatory Visit: Payer: Self-pay | Admitting: *Deleted

## 2015-03-12 MED ORDER — SPIRONOLACTONE 25 MG PO TABS: 12.5000 mg | ORAL_TABLET | Freq: Every day | ORAL | Status: DC

## 2015-03-12 NOTE — Telephone Encounter (Signed)
New message      Pt c/o Shortness Of Breath: STAT if SOB developed within the last 24 hours or pt is noticeably SOB on the phone  1. Are you currently SOB (can you hear that pt is SOB on the phone)? Pt states she is currently sob but I could not hear that over the phone  2. How long have you been experiencing SOB? Today only 3. Are you SOB when sitting or when up moving around? Pt states both  4. Are you currently experiencing any other symptoms? No---pt does not feel well

## 2015-03-12 NOTE — Telephone Encounter (Signed)
Spoke with patient and her weight is down a pound and shortness of breath comes and goes Shortness of breath is not any different than it has been over the last few days Scheduled for ov with  Dr. Mare Ferrari tomorrow

## 2015-03-13 ENCOUNTER — Encounter: Payer: Self-pay | Admitting: Cardiology

## 2015-03-13 ENCOUNTER — Ambulatory Visit (INDEPENDENT_AMBULATORY_CARE_PROVIDER_SITE_OTHER): Payer: Medicare Other | Admitting: Cardiology

## 2015-03-13 VITALS — BP 146/90 | HR 90 | Ht 60.0 in | Wt 112.8 lb

## 2015-03-13 DIAGNOSIS — N183 Chronic kidney disease, stage 3 unspecified: Secondary | ICD-10-CM

## 2015-03-13 DIAGNOSIS — I340 Nonrheumatic mitral (valve) insufficiency: Secondary | ICD-10-CM | POA: Diagnosis not present

## 2015-03-13 DIAGNOSIS — I5033 Acute on chronic diastolic (congestive) heart failure: Secondary | ICD-10-CM | POA: Diagnosis not present

## 2015-03-13 DIAGNOSIS — I482 Chronic atrial fibrillation, unspecified: Secondary | ICD-10-CM

## 2015-03-13 LAB — BASIC METABOLIC PANEL
BUN: 34 mg/dL — ABNORMAL HIGH (ref 6–23)
CALCIUM: 9.9 mg/dL (ref 8.4–10.5)
CO2: 38 mEq/L — ABNORMAL HIGH (ref 19–32)
CREATININE: 1.39 mg/dL — AB (ref 0.40–1.20)
Chloride: 86 mEq/L — ABNORMAL LOW (ref 96–112)
GFR: 38.44 mL/min — ABNORMAL LOW (ref >60.00)
Glucose, Bld: 90 mg/dL (ref 70–99)
Potassium: 3.6 mEq/L (ref 3.5–5.1)
Sodium: 131 mEq/L — ABNORMAL LOW (ref 135–145)

## 2015-03-13 NOTE — Progress Notes (Signed)
Cardiology Office Note   Date:  03/13/2015   ID:  Donica Derouin, DOB 09-Apr-1930, MRN [id number]  PCP:  Lilian Coma, MD  Cardiologist: Darlin Coco MD  No chief complaint on file.     History of Present Illness: [name] [name] is a 81 y.o. female who presents for a work in follow-up office visit.  This pleasant 81 year old woman is seen for followup of her chronic atrial fibrillation. She is a medical patient of Dr. Stephanie Acre. She has a history of chronic atrial fibrillation and essential hypertension. She also has pulmonary hypertension. She is a Restaurant manager, fast food. She refuses anticoagulation for her atrial fibrillation because she is concerned about bleeding. The patient was hospitalized at the end of December 2015 after having severe epistaxis. Her echocardiogram on 06/08/14 showed an ejection fraction of 50-55% with LVH. She was found to have mild to moderate aortic insufficiency, moderate to severe mitral regurgitation, and severe tricuspid regurgitation with pulmonary artery pressure of 64. She also had marked biatrial enlargement. She should be on long-term anticoagulation but she declines anticoagulation. I spoke with her about Eliquis and she does not want to be on anticoagulation. Part of her hesitancy is the fact that she has a concern about blood transfusions because of her religion. On her last hospitalization she had a slight bump in her troponins. She remains on low-dose aspirin 81 mg daily. She has not had any recurrent epistaxis.  On 7/19 she had a swallowing test which showed only trace reflux but she does have esophageal hypomotility problems. Recently she has been having increasing problems with fluid in her legs.  She has had some intermittent dyspnea at rest.  Her weight is up 212, up 3 pounds since July.   Past Medical History  Diagnosis Date  . Mitral valve disease   . Mitral regurgitation   . Hypothyroidism   . B12 deficiency   . Heart  murmur   . Allergy   . OA (osteoarthritis)   . Migraines   . Atrial fibrillation     pt declines anticoag (jehovah's witness)  . Cardiomegaly     on CT scan 03/2011    Past Surgical History  Procedure Laterality Date  . Tonsillectomy    . Nasal endoscopy with epistaxis control  07/15/2012    Procedure: NASAL ENDOSCOPY WITH EPISTAXIS CONTROL;  Surgeon: Izora Gala, MD;  Location: [facility];  Service: ENT;  Laterality: N/A;     Current Outpatient Prescriptions  Medication Sig Dispense Refill  . aspirin 81 MG tablet Take 81 mg by mouth. Mondays, Wednesdays, and Fridays    . B Complex Vitamins (VITAMIN-B COMPLEX PO) Take 1 capsule by mouth daily.     . bumetanide (BUMEX) 1 MG tablet Take 1 mg by mouth 2 (two) times daily.    Marland Kitchen CARTIA XT 120 MG 24 hr capsule TAKE 1 CAPSULE BY MOUTH DAILY 30 capsule 11  . cholecalciferol (VITAMIN D) 1000 UNITS tablet Take 1,000 Units by mouth daily.    . Coenzyme Q10 (COQ10 PO) Take 1 capsule by mouth daily.     Marland Kitchen latanoprost (XALATAN) 0.005 % ophthalmic solution Place 1 drop into both eyes at bedtime.     Marland Kitchen levothyroxine (SYNTHROID, LEVOTHROID) 50 MCG tablet TAKE ONE TABLET BY MOUTH ONE TIME DAILY 30 tablet 5  . metoprolol succinate (TOPROL-XL) 25 MG 24 hr tablet TAKE 1 AND 1/2 TABLETS(37.5 MG) BY MOUTH DAILY 135 tablet 0  . sodium chloride (OCEAN) 0.65 % SOLN nasal spray Place  1 spray into both nostrils at bedtime as needed for congestion.    Marland Kitchen spironolactone (ALDACTONE) 25 MG tablet Take 0.5 tablets (12.5 mg total) by mouth daily. 15 tablet 1   No current facility-administered medications for this visit.    Allergies:   Sulfa antibiotics; Amlodipine; Cozaar; Edecrin; Lasix; Other; Ramipril; and Hctz    Social History:  The patient  reports that she has quit smoking. She has never used smokeless tobacco. She reports that she drinks alcohol. She reports that she does not use illicit drugs.   Family History:  The patient's family history includes  Arthritis in her father; Hypertension in her mother; Stomach cancer in her father; Stroke in her mother.    ROS:  Please see the history of present illness.   Otherwise, review of systems are positive for none.   All other systems are reviewed and negative.    PHYSICAL EXAM: VS:  BP 146/90 mmHg  Pulse 90  Ht 5' (1.524 m)  Wt 112 lb 12.8 oz (51.166 kg)  BMI 22.03 kg/m2 , BMI Body mass index is 22.03 kg/(m^2). GEN: Well nourished, well developed, in no acute distress HEENT: normal Neck: no JVD, carotid bruits, or masses Cardiac: Irregularly irregular.;  Grade 2/6 holosystolic murmur of mitral regurgitation at apex.  There is also a murmur of tricuspid regurgitation at the lower right sternal edge.  The jugular venous pressure is elevated.  The liver is enlarged and pulsatile consistent with her known severe tricuspid regurgitation.  She has marked pretibial and pedal edema. Respiratory:  clear to auscultation bilaterally, normal work of breathing GI: soft, nontender, nondistended, + BS MS: no deformity or atrophy Skin: warm and dry, no rash Neuro:  Strength and sensation are intact Psych: euthymic mood, full affect   EKG:  EKG is ordered today. The ekg ordered today demonstrates atrial flutter/ fibrillation with right superior axis deviation, unchanged since 01/08/15   Recent Labs: 06/14/2014: B Natriuretic Peptide 428.8* 07/16/2014: Magnesium 1.7 07/17/2014: Hemoglobin 11.0*; Platelets 168 03/13/2015: BUN 34*; Creatinine, Ser 1.39*; Potassium 3.6; Sodium 131*    Lipid Panel    Component Value Date/Time   CHOL 190 06/28/2010 1054   TRIG 71.0 06/28/2010 1054   HDL 84.10 06/28/2010 1054   CHOLHDL 2 06/28/2010 1054   VLDL 14.2 06/28/2010 1054   LDLCALC 92 06/28/2010 1054      Wt Readings from Last 3 Encounters:  03/13/15 112 lb 12.8 oz (51.166 kg)  01/06/15 109 lb (49.442 kg)  12/16/14 104 lb 4.8 oz (47.31 kg)         ASSESSMENT AND PLAN:  1. permanent atrial  fibrillation not on anticoagulation because of patient preference (Jehovah's Witness) 2. multi-valvular heart disease with tricuspid regurgitation, pulmonary hypertension, aortic insufficiency, and mitral regurgitation. 3. Hypertensive heart disease 4. peripheral edema, chronic, secondary to right heart failure. 5. normal left ventricular function by echocardiogram 06/08/14 with ejection fraction 50-55% and with LVH. 6. Chronic renal insufficiency 7.  Pulsatile hepatomegaly secondary to tricuspid regurgitation   Current medicines are reviewed at length with the patient today.  The patient does not have concerns regarding medicines.  The following changes have been made:  no change  Labs/ tests ordered today include:   Orders Placed This Encounter  Procedures  . Basic metabolic panel  . EKG 12-Lead    Disposition: We talked about the fact that her condition is not favorable and would be expected to worsen slowly over time.  She is not a candidate for  surgery because of her multiple regurgitant valves and her frailty.  And refusal to allow anticoagulation would also pose a problem. Strategy will be treated try to continue to manage her peripheral edema with adequate doses of Bumex and spironolactone.  We will need to accept a certain degree of renal insufficiency. She will continue current medication including taking the Bumex twice a day and she will keep her appointment in November   Signed, Thomas Brackbill MD 03/13/2015 5:08 PM    [facility] [address], [place], [place]  [address] Phone: [phone]; Fax: [phone]

## 2015-03-13 NOTE — Patient Instructions (Signed)
Medication Instructions:  Your physician recommends that you continue on your current medications as directed. Please refer to the Current Medication list given to you today.  Labwork: bmet  Testing/Procedures: none  Follow-Up: Keep your November appointment

## 2015-03-18 ENCOUNTER — Inpatient Hospital Stay (HOSPITAL_COMMUNITY): Payer: Medicare Other

## 2015-03-18 ENCOUNTER — Inpatient Hospital Stay (HOSPITAL_COMMUNITY)
Admission: EM | Admit: 2015-03-18 | Discharge: 2015-03-21 | DRG: 291 | Disposition: A | Discharge status: Skilled Nursing Facility | Payer: Medicare Other | Attending: Internal Medicine | Admitting: Internal Medicine

## 2015-03-18 ENCOUNTER — Emergency Department (HOSPITAL_COMMUNITY): Payer: Medicare Other

## 2015-03-18 ENCOUNTER — Encounter (HOSPITAL_COMMUNITY): Payer: Self-pay | Admitting: Emergency Medicine

## 2015-03-18 DIAGNOSIS — I5033 Acute on chronic diastolic (congestive) heart failure: Principal | ICD-10-CM | POA: Diagnosis present

## 2015-03-18 DIAGNOSIS — I480 Paroxysmal atrial fibrillation: Secondary | ICD-10-CM | POA: Diagnosis not present

## 2015-03-18 DIAGNOSIS — I270 Primary pulmonary hypertension: Secondary | ICD-10-CM | POA: Diagnosis present

## 2015-03-18 DIAGNOSIS — I083 Combined rheumatic disorders of mitral, aortic and tricuspid valves: Secondary | ICD-10-CM | POA: Diagnosis present

## 2015-03-18 DIAGNOSIS — E871 Hypo-osmolality and hyponatremia: Secondary | ICD-10-CM | POA: Insufficient documentation

## 2015-03-18 DIAGNOSIS — I071 Rheumatic tricuspid insufficiency: Secondary | ICD-10-CM

## 2015-03-18 DIAGNOSIS — R443 Hallucinations, unspecified: Secondary | ICD-10-CM | POA: Diagnosis present

## 2015-03-18 DIAGNOSIS — Z79899 Other long term (current) drug therapy: Secondary | ICD-10-CM | POA: Diagnosis not present

## 2015-03-18 DIAGNOSIS — N183 Chronic kidney disease, stage 3 unspecified: Secondary | ICD-10-CM | POA: Diagnosis present

## 2015-03-18 DIAGNOSIS — G9341 Metabolic encephalopathy: Secondary | ICD-10-CM | POA: Diagnosis present

## 2015-03-18 DIAGNOSIS — I340 Nonrheumatic mitral (valve) insufficiency: Secondary | ICD-10-CM

## 2015-03-18 DIAGNOSIS — I482 Chronic atrial fibrillation, unspecified: Secondary | ICD-10-CM | POA: Diagnosis present

## 2015-03-18 DIAGNOSIS — Z531 Procedure and treatment not carried out because of patient's decision for reasons of belief and group pressure: Secondary | ICD-10-CM | POA: Diagnosis present

## 2015-03-18 DIAGNOSIS — T68XXXA Hypothermia, initial encounter: Secondary | ICD-10-CM | POA: Diagnosis present

## 2015-03-18 DIAGNOSIS — R0602 Shortness of breath: Secondary | ICD-10-CM | POA: Diagnosis present

## 2015-03-18 DIAGNOSIS — I4891 Unspecified atrial fibrillation: Secondary | ICD-10-CM | POA: Diagnosis present

## 2015-03-18 DIAGNOSIS — E039 Hypothyroidism, unspecified: Secondary | ICD-10-CM | POA: Diagnosis present

## 2015-03-18 DIAGNOSIS — K219 Gastro-esophageal reflux disease without esophagitis: Secondary | ICD-10-CM | POA: Diagnosis present

## 2015-03-18 DIAGNOSIS — I130 Hypertensive heart and chronic kidney disease with heart failure and stage 1 through stage 4 chronic kidney disease, or unspecified chronic kidney disease: Secondary | ICD-10-CM | POA: Diagnosis present

## 2015-03-18 DIAGNOSIS — I272 Other secondary pulmonary hypertension: Secondary | ICD-10-CM | POA: Diagnosis present

## 2015-03-18 DIAGNOSIS — R680 Hypothermia, not associated with low environmental temperature: Secondary | ICD-10-CM | POA: Diagnosis present

## 2015-03-18 DIAGNOSIS — Z87891 Personal history of nicotine dependence: Secondary | ICD-10-CM

## 2015-03-18 DIAGNOSIS — I10 Essential (primary) hypertension: Secondary | ICD-10-CM

## 2015-03-18 DIAGNOSIS — R0600 Dyspnea, unspecified: Secondary | ICD-10-CM | POA: Diagnosis not present

## 2015-03-18 DIAGNOSIS — Z7982 Long term (current) use of aspirin: Secondary | ICD-10-CM

## 2015-03-18 DIAGNOSIS — G9340 Encephalopathy, unspecified: Secondary | ICD-10-CM

## 2015-03-18 LAB — BASIC METABOLIC PANEL
Anion gap: 12 (ref 5–15)
Anion gap: 11 (ref 5–15)
BUN: 33 mg/dL — ABNORMAL HIGH (ref 6–20)
BUN: 32 mg/dL — ABNORMAL HIGH (ref 6–20)
CO2: 31 mmol/L (ref 22–32)
CO2: 33 mmol/L — ABNORMAL HIGH (ref 22–32)
Calcium: 9.3 mg/dL (ref 8.9–10.3)
Calcium: 9.2 mg/dL (ref 8.9–10.3)
Chloride: 77 mmol/L — ABNORMAL LOW (ref 101–111)
Chloride: 78 mmol/L — ABNORMAL LOW (ref 101–111)
Creatinine, Ser: 1.37 mg/dL — ABNORMAL HIGH (ref 0.44–1.00)
Creatinine, Ser: 1.34 mg/dL — ABNORMAL HIGH (ref 0.44–1.00)
GFR calc Af Amer: 40 mL/min — ABNORMAL LOW (ref >60)
GFR calc Af Amer: 41 mL/min — ABNORMAL LOW (ref >60)
GFR calc non Af Amer: 35 mL/min — ABNORMAL LOW (ref >60)
GFR calc non Af Amer: 36 mL/min — ABNORMAL LOW (ref >60)
Glucose, Bld: 113 mg/dL — ABNORMAL HIGH (ref 65–99)
Glucose, Bld: 91 mg/dL (ref 65–99)
Potassium: 3.5 mmol/L (ref 3.5–5.1)
Potassium: 3.6 mmol/L (ref 3.5–5.1)
Sodium: 120 mmol/L — ABNORMAL LOW (ref 135–145)
Sodium: 122 mmol/L — ABNORMAL LOW (ref 135–145)

## 2015-03-18 LAB — TROPONIN I: Troponin I: 0.08 ng/mL — ABNORMAL HIGH (ref <0.031)

## 2015-03-18 LAB — URINALYSIS, ROUTINE W REFLEX MICROSCOPIC
Bilirubin Urine: NEGATIVE
Glucose, UA: NEGATIVE mg/dL
Hgb urine dipstick: NEGATIVE
Ketones, ur: NEGATIVE mg/dL
LEUKOCYTES UA: NEGATIVE
NITRITE: NEGATIVE
PROTEIN: NEGATIVE mg/dL
Specific Gravity, Urine: <1.005 — ABNORMAL LOW (ref 1.005–1.030)
UROBILINOGEN UA: 0.2 mg/dL (ref 0.0–1.0)
pH: 7 (ref 5.0–8.0)

## 2015-03-18 LAB — I-STAT TROPONIN, ED: Troponin i, poc: 0.07 ng/mL (ref 0.00–0.08)

## 2015-03-18 LAB — CBC
HCT: 37.1 % (ref 36.0–46.0)
Hemoglobin: 12.2 g/dL (ref 12.0–15.0)
MCH: 26.9 pg (ref 26.0–34.0)
MCHC: 32.9 g/dL (ref 30.0–36.0)
MCV: 81.9 fL (ref 78.0–100.0)
Platelets: 163 10*3/uL (ref 150–400)
RBC: 4.53 MIL/uL (ref 3.87–5.11)
RDW: 15.7 % — ABNORMAL HIGH (ref 11.5–15.5)
WBC: 5.7 10*3/uL (ref 4.0–10.5)

## 2015-03-18 LAB — BRAIN NATRIURETIC PEPTIDE: B Natriuretic Peptide: 1123 pg/mL — ABNORMAL HIGH (ref 0.0–100.0)

## 2015-03-18 LAB — URIC ACID: Uric Acid, Serum: 8.5 mg/dL — ABNORMAL HIGH (ref 2.3–6.6)

## 2015-03-18 LAB — TSH: TSH: 2.889 u[IU]/mL (ref 0.350–4.500)

## 2015-03-18 LAB — CREATININE, URINE, RANDOM: Creatinine, Urine: 19.95 mg/dL

## 2015-03-18 LAB — SODIUM, URINE, RANDOM: Sodium, Ur: 56 mmol/L

## 2015-03-18 LAB — MRSA PCR SCREENING: MRSA BY PCR: NEGATIVE

## 2015-03-18 LAB — CORTISOL: CORTISOL PLASMA: 20.8 ug/dL

## 2015-03-18 MED ORDER — ONDANSETRON HCL 4 MG/2ML IJ SOLN: 4.0000 mg | Freq: Four times a day (QID) | INTRAMUSCULAR | Status: DC | PRN

## 2015-03-18 MED ORDER — METOPROLOL SUCCINATE ER 25 MG PO TB24
37.5000 mg | ORAL_TABLET | Freq: Every day | ORAL | Status: DC
  Administered 2015-03-19: 37.5 mg via ORAL
  Filled 2015-03-18: qty 1

## 2015-03-18 MED ORDER — SENNOSIDES-DOCUSATE SODIUM 8.6-50 MG PO TABS
1.0000 | ORAL_TABLET | Freq: Every day | ORAL | Status: DC
  Administered 2015-03-18: 1 via ORAL
  Filled 2015-03-18 (×3): qty 1

## 2015-03-18 MED ORDER — SODIUM CHLORIDE 3 % IV SOLN
Freq: Once | INTRAVENOUS | Status: DC
  Filled 2015-03-18: qty 100

## 2015-03-18 MED ORDER — FUROSEMIDE 10 MG/ML IJ SOLN
40.0000 mg | Freq: Two times a day (BID) | INTRAMUSCULAR | Status: DC
  Administered 2015-03-18 – 2015-03-19 (×2): 40 mg via INTRAVENOUS
  Filled 2015-03-18 (×2): qty 4

## 2015-03-18 MED ORDER — SODIUM CHLORIDE 3 % IV SOLN: 100.0000 mL/h | Freq: Once | INTRAVENOUS | Status: DC

## 2015-03-18 MED ORDER — SODIUM CHLORIDE 0.9 % IV SOLN: 250.0000 mL | INTRAVENOUS | Status: DC | PRN

## 2015-03-18 MED ORDER — SODIUM CHLORIDE 3 % IV SOLN
Freq: Once | INTRAVENOUS | Status: DC
  Filled 2015-03-18: qty 500

## 2015-03-18 MED ORDER — ACETAMINOPHEN 325 MG PO TABS: 650.0000 mg | ORAL_TABLET | Freq: Four times a day (QID) | ORAL | Status: DC | PRN

## 2015-03-18 MED ORDER — SODIUM CHLORIDE 3 % IV SOLN
100.0000 mL/h | Freq: Once | INTRAVENOUS | Status: DC
  Filled 2015-03-18: qty 500

## 2015-03-18 MED ORDER — DILTIAZEM HCL ER COATED BEADS 120 MG PO CP24
120.0000 mg | ORAL_CAPSULE | Freq: Every day | ORAL | Status: DC
  Administered 2015-03-19 – 2015-03-21 (×3): 120 mg via ORAL
  Filled 2015-03-18 (×3): qty 1

## 2015-03-18 MED ORDER — ASPIRIN EC 81 MG PO TBEC
81.0000 mg | DELAYED_RELEASE_TABLET | ORAL | Status: DC
  Administered 2015-03-20: 81 mg via ORAL
  Filled 2015-03-18 (×2): qty 1

## 2015-03-18 MED ORDER — ENOXAPARIN SODIUM 30 MG/0.3ML ~~LOC~~ SOLN
30.0000 mg | SUBCUTANEOUS | Status: DC
  Administered 2015-03-18 – 2015-03-20 (×3): 30 mg via SUBCUTANEOUS
  Filled 2015-03-18 (×3): qty 0.3

## 2015-03-18 MED ORDER — SORBITOL 70 % SOLN
30.0000 mL | Freq: Every day | Status: DC | PRN
  Filled 2015-03-18: qty 30

## 2015-03-18 MED ORDER — ONDANSETRON HCL 4 MG PO TABS: 4.0000 mg | ORAL_TABLET | Freq: Four times a day (QID) | ORAL | Status: DC | PRN

## 2015-03-18 MED ORDER — SODIUM CHLORIDE 0.9 % IJ SOLN: 3.0000 mL | INTRAMUSCULAR | Status: DC | PRN

## 2015-03-18 MED ORDER — TOLVAPTAN 15 MG PO TABS
15.0000 mg | ORAL_TABLET | ORAL | Status: DC
  Administered 2015-03-18: 15 mg via ORAL
  Filled 2015-03-18 (×2): qty 1

## 2015-03-18 MED ORDER — SODIUM CHLORIDE 0.9 % IJ SOLN
3.0000 mL | Freq: Two times a day (BID) | INTRAMUSCULAR | Status: DC
  Administered 2015-03-18: 3 mL via INTRAVENOUS

## 2015-03-18 MED ORDER — SODIUM CHLORIDE 0.9 % IJ SOLN: 3.0000 mL | Freq: Two times a day (BID) | INTRAMUSCULAR | Status: DC

## 2015-03-18 MED ORDER — ACETAMINOPHEN 650 MG RE SUPP: 650.0000 mg | Freq: Four times a day (QID) | RECTAL | Status: DC | PRN

## 2015-03-18 MED ORDER — IPRATROPIUM-ALBUTEROL 0.5-2.5 (3) MG/3ML IN SOLN: 3.0000 mL | RESPIRATORY_TRACT | Status: DC | PRN

## 2015-03-18 MED ORDER — FUROSEMIDE 10 MG/ML IJ SOLN: 40.0000 mg | Freq: Once | INTRAMUSCULAR | Status: DC

## 2015-03-18 MED ORDER — SODIUM CHLORIDE 0.9 % IJ SOLN
3.0000 mL | Freq: Two times a day (BID) | INTRAMUSCULAR | Status: DC
Start: 2015-03-18 — End: 2015-03-21
  Administered 2015-03-19 – 2015-03-21 (×5): 3 mL via INTRAVENOUS

## 2015-03-18 MED ORDER — LEVOTHYROXINE SODIUM 50 MCG PO TABS
50.0000 ug | ORAL_TABLET | Freq: Every day | ORAL | Status: DC
  Administered 2015-03-19 – 2015-03-21 (×3): 50 ug via ORAL
  Filled 2015-03-18 (×3): qty 1

## 2015-03-18 MED ORDER — ACETAMINOPHEN 325 MG PO TABS: 650.0000 mg | ORAL_TABLET | ORAL | Status: DC | PRN

## 2015-03-18 MED ORDER — ALUM & MAG HYDROXIDE-SIMETH 200-200-20 MG/5ML PO SUSP: 30.0000 mL | Freq: Four times a day (QID) | ORAL | Status: DC | PRN

## 2015-03-18 NOTE — ED Provider Notes (Signed)
CSN: [id number]     Arrival date & time 03/18/15  1314 History   First MD Initiated Contact with Patient 03/18/15 1524     Chief Complaint  Patient presents with  . Shortness of Breath     (Consider location/radiation/quality/duration/timing/severity/associated sxs/prior Treatment) HPI Comments: Patient presents to the ED with a chief complaint of SOB.  Patient states that she saw her PCP, Dr. Stephanie Acre today, and was told to come to the ED for worsening SOB.  Additionally, patient states that she has been hallucinating for the past day.  She states that she [place], but knew that she was hallucinating at the time.  She currently complains of shortness of breath, but denies any chest pain.  She denies any worsening peripheral edema.  She denies and fevers, chills, nausea, or vomiting.  The history is provided by the patient. No language interpreter was used.    Past Medical History  Diagnosis Date  . Mitral valve disease   . Mitral regurgitation   . Hypothyroidism   . B12 deficiency   . Heart murmur   . Allergy   . OA (osteoarthritis)   . Migraines   . Atrial fibrillation ([place])     pt declines anticoag (jehovah's witness)  . Cardiomegaly     on CT scan 03/2011   Past Surgical History  Procedure Laterality Date  . Tonsillectomy    . Nasal endoscopy with epistaxis control  07/15/2012    Procedure: NASAL ENDOSCOPY WITH EPISTAXIS CONTROL;  Surgeon: Izora Gala, MD;  Location: [facility];  Service: ENT;  Laterality: N/A;   Family History  Problem Relation Age of Onset  . Stroke Mother   . Hypertension Mother   . Arthritis Father   . Stomach cancer Father    Social History  Substance Use Topics  . Smoking status: Former Research scientist (life sciences)  . Smokeless tobacco: Never Used  . Alcohol Use: Yes     Comment: Social   OB History    No data available     Review of Systems  Constitutional: Negative for fever and chills.  Respiratory: Positive for  shortness of breath.   Cardiovascular: Negative for chest pain.  Gastrointestinal: Negative for nausea, vomiting, diarrhea and constipation.  Genitourinary: Negative for dysuria.  All other systems reviewed and are negative.     Allergies  Sulfa antibiotics; Amlodipine; Cozaar; Edecrin; Lasix; Other; Ramipril; and Hctz  Home Medications   Prior to Admission medications   Medication Sig Start Date End Date Taking? Authorizing Provider  aspirin 81 MG tablet Take 81 mg by mouth. Mondays, Wednesdays, and Fridays    Historical Provider, MD  B Complex Vitamins (VITAMIN-B COMPLEX PO) Take 1 capsule by mouth daily.     Historical Provider, MD  bumetanide (BUMEX) 1 MG tablet Take 1 mg by mouth 2 (two) times daily.    Historical Provider, MD  CARTIA XT 120 MG 24 hr capsule TAKE 1 CAPSULE BY MOUTH DAILY 01/07/15   Darlin Coco, MD  cholecalciferol (VITAMIN D) 1000 UNITS tablet Take 1,000 Units by mouth daily.    Historical Provider, MD  Coenzyme Q10 (COQ10 PO) Take 1 capsule by mouth daily.     Historical Provider, MD  latanoprost (XALATAN) 0.005 % ophthalmic solution Place 1 drop into both eyes at bedtime.  04/16/14   Historical Provider, MD  levothyroxine (SYNTHROID, LEVOTHROID) 50 MCG tablet TAKE ONE TABLET BY MOUTH ONE TIME DAILY 07/09/11   Rowe Clack, MD  metoprolol succinate (TOPROL-XL) 25 MG 24 hr tablet TAKE 1 AND 1/2 TABLETS(37.5 MG) BY MOUTH DAILY 11/24/14   Darlin Coco, MD  sodium chloride (OCEAN) 0.65 % SOLN nasal spray Place 1 spray into both nostrils at bedtime as needed for congestion.    Historical Provider, MD  spironolactone (ALDACTONE) 25 MG tablet Take 0.5 tablets (12.5 mg total) by mouth daily. 03/12/15   Darlin Coco, MD   BP 164/93 mmHg  Pulse 90  Ht 5\' 2"  (1.575 m)  Wt 111 lb (50.349 kg)  BMI 20.30 kg/m2  SpO2 96% Physical Exam  Constitutional: She is oriented to person, place, and time. She appears well-developed and well-nourished.  HENT:  Head:  Normocephalic and atraumatic.  Eyes: Conjunctivae and EOM are normal. Pupils are equal, round, and reactive to light.  Neck: Normal range of motion. Neck supple.  Cardiovascular: Normal rate and regular rhythm.  Exam reveals no gallop and no friction rub.   No murmur heard. Pulmonary/Chest: Effort normal and breath sounds normal. No respiratory distress. She has no wheezes. She has no rales. She exhibits no tenderness.  Abdominal: Soft. Bowel sounds are normal. She exhibits no distension and no mass. There is no tenderness. There is no rebound and no guarding.  Musculoskeletal: Normal range of motion. She exhibits no edema or tenderness.  Neurological: She is alert and oriented to person, place, and time.  Skin: Skin is warm and dry.  Psychiatric: She has a normal mood and affect. Her behavior is normal. Judgment and thought content normal.  Nursing note and vitals reviewed.   ED Course  Procedures (including critical care time) Labs Review Labs Reviewed  BASIC METABOLIC PANEL - Abnormal; Notable for the following:    Sodium 120 (*)    Chloride 77 (*)    Glucose, Bld 113 (*)    BUN 33 (*)    Creatinine, Ser 1.37 (*)    GFR calc non Af Amer 35 (*)    GFR calc Af Amer 40 (*)    All other components within normal limits  CBC - Abnormal; Notable for the following:    RDW 15.7 (*)    All other components within normal limits    Imaging Review Dg Chest 2 View  03/18/2015   CLINICAL DATA:  Shortness of breath several days, weakness, history of CHF, valvular heart disease, pulmonary hypertension, previous history of tobacco use.  EXAM: CHEST  2 VIEW  COMPARISON:  PA and lateral chest x-ray of June 07, 2014  FINDINGS: The lungs remain hyperinflated with hemidiaphragm flattening and increased AP dimension of the thorax. There is minimal density projecting over the right lateral costophrenic gutter. The cardiac silhouette remains enlarged. The pulmonary vascularity is not engorged. There  is no significant pleural effusion. There is prominent thoracic kyphosis and there is mild levocurvature of the thoracolumbar junction.  IMPRESSION: COPD and cardiomegaly. There is no pulmonary edema or alveolar pneumonia. Subsegmental atelectasis is present laterally overlying right cardiophrenic angle.   Electronically Signed   By: David  Martinique M.D.   On: 03/18/2015 14:37   I have personally reviewed and evaluated these images and lab results as part of my medical decision-making.   EKG Interpretation   Date/Time:  Wednesday March 18 2015 15:48:41 EDT Ventricular Rate:  67 PR Interval:    QRS Duration: 139 QT Interval:  482 QTC Calculation: 509 R Axis:   -76 Text Interpretation:  Atrial fibrillation Nonspecific IVCD with LAD LVH  with secondary repolarization abnormality Anterior infarct,  old No  significant change since last tracing Confirmed by Wilson Singer  MD, [place]  [phone]) on 03/18/2015 4:13:23 PM      MDM   Final diagnoses:  Hyponatremia   Patient sent to ED by PCP (Dr Stephanie Acre) for worsening SOB.  Patient also has had some hallucinations.  Labs are remarkable for Na of 120.  Suspect that this is cause of patient's hallucinations.  Patient initially alert and oriented, but when reassessed is more lethargic.  Patient seen by and discussed with Dr. Wilson Singer, who recommends giving 100cc of hypertonic saline.  No evidence of infection, but patient is hypothermic.  Patient on bear hugger.  UA is pending, along with additional urine labs for hyponatremia.  Will consult hospitalist for admission.  PCP spoke with hospice, but it is unclear at this time as to what the plan for end of life care is.  POA is Jaynie Collins [phone], [phone].  BNP is 1123, this is higher than normal for the patient.  Patient discussed with Dr. Grandville Silos, from hospitalist service, who will admit the patient.  Medications  sodium chloride (hypertonic) 3 % solution (not administered)  sodium chloride  (hypertonic) 3 % solution (not administered)    CRITICAL CARE Performed by: Montine Circle   Total critical care time: 45  Critical care time was exclusive of separately billable procedures and treating other patients.  Critical care was necessary to treat or prevent imminent or life-threatening deterioration.  Critical care was time spent personally by me on the following activities: development of treatment plan with patient and/or surrogate as well as nursing, discussions with consultants, evaluation of patient's response to treatment, examination of patient, obtaining history from patient or surrogate, ordering and performing treatments and interventions, ordering and review of laboratory studies, ordering and review of radiographic studies, pulse oximetry and re-evaluation of patient's condition.     Montine Circle, PA-C 03/18/15 1745  Virgel Manifold, MD 03/19/15 [phone]

## 2015-03-18 NOTE — H&P (Signed)
[facility] History and Physical  [name] [name] JQB:[id number] DOB: 03/18/1930 DOA: 03/18/2015  Referring physician: Dr. Wilson Singer PCP: Lilian Coma, MD  Cardiologist: Dr. Mare Ferrari  Chief Complaint: Worsening shortness of breath  HPI: [name] [name] is a 81 y.o. female  With history of chronic atrial fibrillation, hypertension, mild to moderate aortic insufficiency, moderate to severe mitral regurgitation, severe tricuspid regurgitation with markedly biatrial enlargement, hypothyroidism, osteoarthritis who presents to the ED with 2 week history of worsening shortness of breath and a 2 day history of hallucinations. Patient states for the past 2 days she's been hallucinating and gives an example that this morning she says she went to get breakfast and came back home however should know she did not go anywhere. Patient states she's been feeling bad. Patient denies any nausea, no emesis, no chest pain, no fever, no diarrhea, no melanotic, no hematochezia, no cough. Patient does endorse some chills, some abdominal pain which has since resolved, constipation, generalized weakness, worsening shortness of breath over the past 2 weeks. Patient also states has had some decreased intake. Patient denies any recent surgeries. No recent long rides. Patient stated that she doubled the dose of her diuretics this week for worsening fluid retention however noticed no significant changes with her shortness of breath. Patient went to her PCPs office and she was sent to the ED. In the emergency room, basic metabolic profile obtained had a sodium of 120 chloride of 77 BUN of 33 creatinine of 1.37 otherwise was within normal limits. CBC obtained was unremarkable. BNP was elevated at 1123. Point-of-care troponin was negative. EKG showed atrial fibrillation with LVH. Chest x-ray was negative for any acute infiltrate. Patient also noted to be hypothermic with a temperature of 94.3 Lab work was ordered and 3%  saline was ordered. ED. [facility] will call to admit the patient for further evaluation and management.   Review of Systems: As per history of present illness otherwise negative. Constitutional:  No weight loss, night sweats, Fevers, chills, fatigue.  HEENT:  No headaches, Difficulty swallowing,Tooth/dental problems,Sore throat,  No sneezing, itching, ear ache, nasal congestion, post nasal drip,  Cardio-vascular:  No chest pain, Orthopnea, PND, swelling in lower extremities, anasarca, dizziness, palpitations  GI:  No heartburn, indigestion, abdominal pain, nausea, vomiting, diarrhea, change in bowel habits, loss of appetite  Resp:  No shortness of breath with exertion or at rest. No excess mucus, no productive cough, No non-productive cough, No coughing up of blood.No change in color of mucus.No wheezing.No chest wall deformity  Skin:  no rash or lesions.  GU:  no dysuria, change in color of urine, no urgency or frequency. No flank pain.  Musculoskeletal:  No joint pain or swelling. No decreased range of motion. No back pain.  Psych:  No change in mood or affect. No depression or anxiety. No memory loss.   Past Medical History  Diagnosis Date  . Mitral valve disease   . Mitral regurgitation   . Hypothyroidism   . B12 deficiency   . Heart murmur   . Allergy   . OA (osteoarthritis)   . Migraines   . Atrial fibrillation ([place])     pt declines anticoag (jehovah's witness)  . Cardiomegaly     on CT scan 03/2011   Past Surgical History  Procedure Laterality Date  . Tonsillectomy    . Nasal endoscopy with epistaxis control  07/15/2012    Procedure: NASAL ENDOSCOPY WITH EPISTAXIS CONTROL;  Surgeon: Izora Gala, MD;  Location: [facility];  Service: ENT;  Laterality: N/A;   Social History:  reports that she has quit smoking. She has never used smokeless tobacco. She reports that she drinks alcohol. She reports that she does not use illicit drugs.  Allergies  Allergen Reactions   . Amlodipine Other (See Comments)    Pain in jaw, and peripheral edema  . Other Other (See Comments)    PT IS OF [place]. NO BLOOD PRODUCTS.  . Ramipril Other (See Comments)    Dyspnea - Tolerates lisinopril  . Cozaar Other (See Comments)    insomnia  . Edecrin [Ethacrynic Acid] Nausea And Vomiting  . Hctz [Hydrochlorothiazide] Rash    Takes Triamterene/HCTZ at home.  . Lasix [Furosemide] Itching  . Sulfa Antibiotics Itching    Family History  Problem Relation Age of Onset  . Stroke Mother   . Hypertension Mother   . Arthritis Father   . Stomach cancer Father    mother deceased age 52 from a stroke. Father deceased age 56 from old age.  Prior to Admission medications   Medication Sig Start Date End Date Taking? Authorizing Provider  aspirin 81 MG tablet Take 81 mg by mouth. Mondays, Wednesdays, and Fridays   Yes Historical Provider, MD  bumetanide (BUMEX) 1 MG tablet Take 1 mg by mouth 2 (two) times daily.   Yes Historical Provider, MD  CARTIA XT 120 MG 24 hr capsule TAKE 1 CAPSULE BY MOUTH DAILY 01/07/15  Yes Darlin Coco, MD  levothyroxine (SYNTHROID, LEVOTHROID) 50 MCG tablet TAKE ONE TABLET BY MOUTH ONE TIME DAILY 07/09/11  Yes Rowe Clack, MD  metoprolol succinate (TOPROL-XL) 25 MG 24 hr tablet TAKE 1 AND 1/2 TABLETS(37.5 MG) BY MOUTH DAILY 11/24/14  Yes Darlin Coco, MD  spironolactone (ALDACTONE) 25 MG tablet Take 0.5 tablets (12.5 mg total) by mouth daily. 03/12/15  Yes Darlin Coco, MD   Physical Exam: Filed Vitals:   03/18/15 1715 03/18/15 1745 03/18/15 1800 03/18/15 1815  BP: 130/86 152/80 138/80 145/73  Pulse:  61 59 56  Temp:      TempSrc:      Resp:   23 16  Height:      Weight:      SpO2: 97% 97% 97% 97%    Wt Readings from Last 3 Encounters:  03/18/15 50.349 kg (111 lb)  03/13/15 51.166 kg (112 lb 12.8 oz)  01/06/15 49.442 kg (109 lb)    General:  Frail female laying on gurney sleeping however easily arousable and  answering questions appropriately. Speaking in full sentences in no acute cardiopulmonary distress.  Eyes: PERRLA, EOMI, normal lids, irises & conjunctiva ENT: grossly normal hearing, lips & tongue Neck: no LAD, masses or thyromegaly Cardiovascular: Irregularly irregular. 3+ bilateral lower extremity edema extending up to thighs. Telemetry: Atrial fibrillation  Respiratory: CTA bilaterally, no w/r/r. Normal respiratory effort. Abdomen: soft, ntnd, positive bowel sounds, no rebound, no guarding Skin: no rash or induration seen on limited exam Musculoskeletal: grossly normal tone BUE/BLE Psychiatric: grossly normal mood and affect, speech fluent and appropriate Neurologic: Alert and oriented 3. Cranial nerves II through XII grossly intact. No focal deficits.           Labs on Admission:  Basic Metabolic Panel:  Recent Labs Lab 03/13/15 1158 03/18/15 1332  NA 131* 120*  K 3.6 3.5  CL 86* 77*  CO2 38* 31  GLUCOSE 90 113*  BUN 34* 33*  CREATININE 1.39* 1.37*  CALCIUM 9.9 9.3   Liver Function Tests: No  results for input(s): AST, ALT, ALKPHOS, BILITOT, PROT, ALBUMIN in the last 168 hours. No results for input(s): LIPASE, AMYLASE in the last 168 hours. No results for input(s): AMMONIA in the last 168 hours. CBC:  Recent Labs Lab 03/18/15 1332  WBC 5.7  HGB 12.2  HCT 37.1  MCV 81.9  PLT 163   Cardiac Enzymes: No results for input(s): CKTOTAL, CKMB, CKMBINDEX, TROPONINI in the last 168 hours.  BNP (last 3 results)  Recent Labs  06/13/14 0456 06/14/14 0419 03/18/15 1557  BNP 506.5* 428.8* 1123.0*    ProBNP (last 3 results) No results for input(s): PROBNP in the last 8760 hours.  CBG: No results for input(s): GLUCAP in the last 168 hours.  Radiological Exams on Admission: Dg Chest 2 View  03/18/2015   CLINICAL DATA:  Shortness of breath several days, weakness, history of CHF, valvular heart disease, pulmonary hypertension, previous history of tobacco use.   EXAM: CHEST  2 VIEW  COMPARISON:  PA and lateral chest x-ray of June 07, 2014  FINDINGS: The lungs remain hyperinflated with hemidiaphragm flattening and increased AP dimension of the thorax. There is minimal density projecting over the right lateral costophrenic gutter. The cardiac silhouette remains enlarged. The pulmonary vascularity is not engorged. There is no significant pleural effusion. There is prominent thoracic kyphosis and there is mild levocurvature of the thoracolumbar junction.  IMPRESSION: COPD and cardiomegaly. There is no pulmonary edema or alveolar pneumonia. Subsegmental atelectasis is present laterally overlying right cardiophrenic angle.   Electronically Signed   By: David  Martinique M.D.   On: 03/18/2015 14:37    EKG: Independently reviewed. Atrial fibrillation with LVH  Assessment/Plan Principal Problem:   Hyponatremia Active Problems:   Hypothermia   Hypothyroidism   Essential hypertension   Atrial fibrillation (HCC)   GERD   Tricuspid regurgitation   Pulmonary hypertension (HCC)   Acute on chronic diastolic CHF (congestive heart failure) (HCC)   Chronic a-fib (HCC)   Mitral regurgitation, moderate to severe   Chronic kidney disease, stage III (moderate)   SOB (shortness of breath)   Encephalopathy, metabolic  #1 severe acute on chronic hyponatremia Likely secondary to a hypervolemic hyponatremia as patient is noted to be volume overloaded on examination with 3+ bilateral lower extremity edema and a BNP that is elevated at 1123. Chest x-ray is negative for any acute infiltrate. Urine studies have been ordered of a urine sodium, urine creatinine, urine osmolality, serum osmolality, TSH. Will check a cortisol level. Check a CT of the head. Will hold off on 3% saline. Check orthostatics. Strict I's and no. Daily weights. Will place patient on Lasix 40 mg IV every 12 hours. Follow. Consult with nephrology for further evaluation and management.  #2 hypothermia Unknown  etiology. Patient did state that she did have some chills. Will panculture the patient with a UA with cultures and sensitivities, blood cultures 2, lactic acid level. Chest x-ray is negative. Will check a TSH. Check a cortisol level. Place on a bear hugger. Follow.  #3 acute on chronic diastolic right-sided CHF Patient is noted on exam to be volume overloaded with 3+ bilateral lower extremity edema extending up to her thighs. Patient also does have a elevated BNP at 1123. Chest x-ray is negative for any acute infiltrates. Patient does have likely right-sided heart failure. Patient also with complaints of worsening shortness of breath over the past several weeks. We'll cycle cardiac enzymes every 6 hours 3. Check a TSH. Check a 2-D echo. Repeat EKG in  the morning. Place on Lasix 40 mg IV every 12 hours. We'll consult with cardiology for further evaluation and management.  #4 chronic atrial fibrillation Continue Cardizem and metoprolol for rate control. Aspirin. Patient has refused anticoagulation in the past secondary to being a Jehovah's Witness.  #5 chronic kidney disease stage III stable.  #6 metabolic encephalopathy Patient complaining of hallucinations likely secondary to hyponatremia. Patient has been pancultured. Check a TSH. Check a head CT. Will monitor closely with diuresis and with correction of sodium. Follow.  #7 hypothyroidism Check a TSH. Continue home dose Synthroid.  #8 hypertension Continue Cardizem and metoprolol.  #9 prophylaxis Lovenox for DVT prophylaxis.   Code Status: Full DVT Prophylaxis: Lovenox. Family Communication: Updated patient and sister at bedside. Disposition Plan: Admit to stepdown unit.  Time spent: 65 minutes  Shoshanah Dapper M.D. [facility] Pager [phone]

## 2015-03-18 NOTE — Consult Note (Signed)
Reason for Consult:Hyponatremia Referring Physician: Dr. Grandville Silos  Chief Complaint: Dyspnea  Assessment/Plan: 1. Hyponatremia - hypervolemic hypovolemia evidenced by volume overload on physical examination and hypertensive as well. If patient fails fluid restriction (1L/24hr) as well as diuretic therapy then Tolvaptan is certainly indicated; however, there are non randomized studies which suggest that the use of Tolvaptan can decrease the length of hospital stay. Contraindication to V2 receptor antagonist therapy would be hypovolemia which is not the case in this patient. A high urinary sodium can be predictive that the patient may need more Tolvaptan for a good response. Regardless, we will need to monitor the serum sodium, urinary sodium and possibly free water clearance if there is overly rapid correction. In one study 10% of the patients corrected too quickly but fortunately did not develop overt signs of demyelination.  - Will treat this as subacute hyponatremia to be on the conservative side and goal will be an increase in SNa of no greater than 62meq/L/1st 24hrs.   - Keep in mind that her baseline SNa may be in the low 130's which is likely appropriate for her.  - Mainstay of therapy will be fluid restriction to 1L (she may have an element of poor solute intake as well given her muscle mass - ie "tea and toast diet") and diuretic therapy.   - Tolvaptan + IV diuretics will help with gut edema in the acute setting which will allow transition to oral diuretics at home.  - Monitor mainly SNa closely q1-2 hrs for the first 8hrs after Tolvaptan is given.  - Agree with checking a TSH, free T4 and cortisol. 2. Chronic atrial fibrillation - not a candidate for anticoagulation given her refusal of blood products as well as history of epistaxis in the past. 3. Hypertension - hypervolemia contributing and would avoid titrating up on anti-hypertensives with the addition of IV diuretics +  tolvaptan. 4. Hallucinations - difficult to discern etiology but concerning that it may be related to the hyponatremia. 5. Hypothyroidism - can contribute to the hyponatremia and will need to check the free T4 (keep in the upper third range).    HPI: [name] [name] is an 81 y.o. female with a history of chronic atrial fibrillation, severe MR and TR, not a candidate for surgical therapies because she is a Sales promotion account executive Witness and does not want to receive blood products precluding the use of Coumadin as well. She is presenting with a 2 week history of worsening dyspnea and 2 day history of hallucinations. She denies any NSAID use, fever, chills, productive cough or diarrhea. She has had generalized weakness, malaise, poor appetite over the past 2 weeks as well. She denies having chest pain, loss of consciousness but has had some dizziness as well. She denies any nausea or vomiting and reportedly doubled her intake of diuretics. Unfortunately she states that her weight actually increased by about 6 lbs over the past 3 days despite increasing the diuretics. She was found to have a serum sodium of 120 and was at her baseline creatinine in the 1.3 range. She was also noted to be hypothermic but no obvious site of infection.  Review of Systems  Constitutional: Positive for weight loss. Negative for fever and chills.  HENT: Negative for ear discharge and nosebleeds.   Eyes: Negative for blurred vision and double vision.  Respiratory: Positive for cough and shortness of breath. Negative for hemoptysis and sputum production.   Cardiovascular: Positive for orthopnea and leg swelling. Negative for palpitations.  Gastrointestinal: Negative  for heartburn, nausea and vomiting.  Genitourinary: Negative for dysuria and urgency.  Musculoskeletal: Negative for myalgias and neck pain.  Skin: Negative for rash.  Neurological: Positive for weakness. Negative for dizziness, seizures, loss of consciousness and headaches.   Endo/Heme/Allergies: Does not bruise/bleed easily.  Psychiatric/Behavioral: Negative for depression.   Pertinent items are noted in HPI.  Chemistry and CBC: CREAT  Date/Time Value Ref Range Status  09/06/2010 12:29 PM 1.10 0.40 - 1.20 mg/dL Final   CREATININE, SER  Date/Time Value Ref Range Status  03/18/2015 07:35 PM 1.34* 0.44 - 1.00 mg/dL Final  03/18/2015 01:32 PM 1.37* 0.44 - 1.00 mg/dL Final  03/13/2015 11:58 AM 1.39* 0.40 - 1.20 mg/dL Final  03/06/2015 03:12 PM 1.66* 0.40 - 1.20 mg/dL Final  09/04/2014 03:21 PM 1.35* 0.40 - 1.20 mg/dL Final  07/17/2014 08:29 AM 1.53* 0.50 - 1.10 mg/dL Final  07/15/2014 03:24 PM 1.42* 0.50 - 1.10 mg/dL Final  07/10/2014 11:06 AM 1.45* 0.40 - 1.20 mg/dL Final  06/27/2014 10:36 AM 1.63* 0.40 - 1.20 mg/dL Final  06/14/2014 04:19 AM 1.63* 0.50 - 1.10 mg/dL Final  06/13/2014 04:56 AM 1.39* 0.50 - 1.10 mg/dL Final  06/12/2014 04:24 AM 1.43* 0.50 - 1.10 mg/dL Final  06/11/2014 05:20 AM 1.36* 0.50 - 1.10 mg/dL Final  06/10/2014 04:37 AM 1.51* 0.50 - 1.10 mg/dL Final  06/09/2014 05:13 AM 1.16* 0.50 - 1.10 mg/dL Final  06/08/2014 01:44 AM 1.21* 0.50 - 1.10 mg/dL Final  06/07/2014 10:07 PM 1.27* 0.50 - 1.10 mg/dL Final  05/26/2014 12:29 PM 1.3* 0.4 - 1.2 mg/dL Final  05/07/2014 11:09 AM 1.3* 0.4 - 1.2 mg/dL Final  09/04/2012 04:32 PM 1.3* 0.4 - 1.2 mg/dL Final  07/15/2012 12:37 AM 0.90 0.50 - 1.10 mg/dL Final  05/14/2012 12:14 PM 1.2 0.4 - 1.2 mg/dL Final  01/13/2012 11:09 AM 1.1 0.4 - 1.2 mg/dL Final  07/08/2011 11:17 AM 1.1 0.4 - 1.2 mg/dL Final  03/31/2011 03:07 PM 1.2 0.4 - 1.2 mg/dL Final  01/07/2011 11:19 AM 1.0 0.4 - 1.2 mg/dL Final  09/20/2010 10:03 AM 1.3* 0.4 - 1.2 mg/dL Final  06/28/2010 10:54 AM 1.0 0.4 - 1.2 mg/dL Final  12/25/2009 10:44 AM 1.2 0.4-1.2 mg/dL Final  07/25/2008 10:04 AM 1.1 0.4-1.2 mg/dL Final    Recent Labs Lab 03/13/15 1158 03/18/15 1332 03/18/15 1935  NA 131* 120* 122*  K 3.6 3.5 3.6  CL 86* 77* 78*  CO2  38* 31 33*  GLUCOSE 90 113* 91  BUN 34* 33* 32*  CREATININE 1.39* 1.37* 1.34*  CALCIUM 9.9 9.3 9.2    Recent Labs Lab 03/18/15 1332  WBC 5.7  HGB 12.2  HCT 37.1  MCV 81.9  PLT 163   Liver Function Tests: No results for input(s): AST, ALT, ALKPHOS, BILITOT, PROT, ALBUMIN in the last 168 hours. No results for input(s): LIPASE, AMYLASE in the last 168 hours. No results for input(s): AMMONIA in the last 168 hours. Cardiac Enzymes:  Recent Labs Lab 03/18/15 1935  TROPONINI 0.08*   Iron Studies: No results for input(s): IRON, TIBC, TRANSFERRIN, FERRITIN in the last 72 hours. PT/INR: $RemoveBefore'@LABRCNTIP'OrFRanfExncwS$ (inr:5)  Xrays/Other Studies: ) Results for orders placed or performed during the hospital encounter of 03/18/15 (from the past 48 hour(s))  Basic metabolic panel     Status: Abnormal   Collection Time: 03/18/15  1:32 PM  Result Value Ref Range   Sodium 120 (L) 135 - 145 mmol/L   Potassium 3.5 3.5 - 5.1 mmol/L   Chloride 77 (L) 101 -  111 mmol/L   CO2 31 22 - 32 mmol/L   Glucose, Bld 113 (H) 65 - 99 mg/dL   BUN 33 (H) 6 - 20 mg/dL   Creatinine, Ser 1.37 (H) 0.44 - 1.00 mg/dL   Calcium 9.3 8.9 - 10.3 mg/dL   GFR calc non Af Amer 35 (L) >60 mL/min   GFR calc Af Amer 40 (L) >60 mL/min    Comment: (NOTE) The eGFR has been calculated using the CKD EPI equation. This calculation has not been validated in all clinical situations. eGFR's persistently <60 mL/min signify possible Chronic Kidney Disease.    Anion gap 12 5 - 15  CBC     Status: Abnormal   Collection Time: 03/18/15  1:32 PM  Result Value Ref Range   WBC 5.7 4.0 - 10.5 K/uL   RBC 4.53 3.87 - 5.11 MIL/uL   Hemoglobin 12.2 12.0 - 15.0 g/dL   HCT 37.1 36.0 - 46.0 %   MCV 81.9 78.0 - 100.0 fL   MCH 26.9 26.0 - 34.0 pg   MCHC 32.9 30.0 - 36.0 g/dL   RDW 15.7 (H) 11.5 - 15.5 %   Platelets 163 150 - 400 K/uL  Brain natriuretic peptide     Status: Abnormal   Collection Time: 03/18/15  3:57 PM  Result Value Ref Range   B  Natriuretic Peptide 1123.0 (H) 0.0 - 100.0 pg/mL    Comment: CALLED TO C KING,RN 1726 03/18/15 D BRADLEY CORRECTED ON 10/05 AT 1725: PREVIOUSLY REPORTED AS A   I-stat troponin, ED     Status: None   Collection Time: 03/18/15  4:04 PM  Result Value Ref Range   Troponin i, poc 0.07 0.00 - 0.08 ng/mL   Comment 3            Comment: Due to the release kinetics of cTnI, a negative result within the first hours of the onset of symptoms does not rule out myocardial infarction with certainty. If myocardial infarction is still suspected, repeat the test at appropriate intervals.   Sodium, urine, random     Status: None   Collection Time: 03/18/15  5:30 PM  Result Value Ref Range   Sodium, Ur 56 mmol/L  Creatinine, urine, random     Status: None   Collection Time: 03/18/15  5:30 PM  Result Value Ref Range   Creatinine, Urine 19.95 mg/dL  Urinalysis, Routine w reflex microscopic (not at [facility])     Status: Abnormal   Collection Time: 03/18/15  5:30 PM  Result Value Ref Range   Color, Urine YELLOW YELLOW   APPearance CLEAR CLEAR   Specific Gravity, Urine <1.005 (L) 1.005 - 1.030   pH 7.0 5.0 - 8.0   Glucose, UA NEGATIVE NEGATIVE mg/dL   Hgb urine dipstick NEGATIVE NEGATIVE   Bilirubin Urine NEGATIVE NEGATIVE   Ketones, ur NEGATIVE NEGATIVE mg/dL   Protein, ur NEGATIVE NEGATIVE mg/dL   Urobilinogen, UA 0.2 0.0 - 1.0 mg/dL   Nitrite NEGATIVE NEGATIVE   Leukocytes, UA NEGATIVE NEGATIVE    Comment: MICROSCOPIC NOT DONE ON URINES WITH NEGATIVE PROTEIN, BLOOD, LEUKOCYTES, NITRITE, OR GLUCOSE <1000 mg/dL.  MRSA PCR Screening     Status: None   Collection Time: 03/18/15  7:26 PM  Result Value Ref Range   MRSA by PCR NEGATIVE NEGATIVE    Comment:        The GeneXpert MRSA Assay (FDA approved for NASAL specimens only), is one component of a comprehensive MRSA colonization surveillance program. It  is not intended to diagnose MRSA infection nor to guide or monitor treatment for MRSA  infections.   TSH     Status: None   Collection Time: 03/18/15  7:35 PM  Result Value Ref Range   TSH 2.889 0.350 - 4.500 uIU/mL  Uric acid     Status: Abnormal   Collection Time: 03/18/15  7:35 PM  Result Value Ref Range   Uric Acid, Serum 8.5 (H) 2.3 - 6.6 mg/dL  Cortisol     Status: None   Collection Time: 03/18/15  7:35 PM  Result Value Ref Range   Cortisol, Plasma 20.8 ug/dL    Comment: (NOTE) AM    6.7 - 22.6 ug/dL PM   <10.0       ug/dL   Troponin I (q 6hr x 3)     Status: Abnormal   Collection Time: 03/18/15  7:35 PM  Result Value Ref Range   Troponin I 0.08 (H) <0.031 ng/mL    Comment:        PERSISTENTLY INCREASED TROPONIN VALUES IN THE RANGE OF 0.04-0.49 ng/mL CAN BE SEEN IN:       -UNSTABLE ANGINA       -CONGESTIVE HEART FAILURE       -MYOCARDITIS       -CHEST TRAUMA       -ARRYHTHMIAS       -LATE PRESENTING MYOCARDIAL INFARCTION       -COPD   CLINICAL FOLLOW-UP RECOMMENDED.   Basic metabolic panel     Status: Abnormal   Collection Time: 03/18/15  7:35 PM  Result Value Ref Range   Sodium 122 (L) 135 - 145 mmol/L   Potassium 3.6 3.5 - 5.1 mmol/L   Chloride 78 (L) 101 - 111 mmol/L   CO2 33 (H) 22 - 32 mmol/L   Glucose, Bld 91 65 - 99 mg/dL   BUN 32 (H) 6 - 20 mg/dL   Creatinine, Ser 1.34 (H) 0.44 - 1.00 mg/dL   Calcium 9.2 8.9 - 10.3 mg/dL   GFR calc non Af Amer 36 (L) >60 mL/min   GFR calc Af Amer 41 (L) >60 mL/min    Comment: (NOTE) The eGFR has been calculated using the CKD EPI equation. This calculation has not been validated in all clinical situations. eGFR's persistently <60 mL/min signify possible Chronic Kidney Disease.    Anion gap 11 5 - 15   Dg Chest 2 View  03/18/2015   CLINICAL DATA:  Shortness of breath several days, weakness, history of CHF, valvular heart disease, pulmonary hypertension, previous history of tobacco use.  EXAM: CHEST  2 VIEW  COMPARISON:  PA and lateral chest x-ray of June 07, 2014  FINDINGS: The lungs remain  hyperinflated with hemidiaphragm flattening and increased AP dimension of the thorax. There is minimal density projecting over the right lateral costophrenic gutter. The cardiac silhouette remains enlarged. The pulmonary vascularity is not engorged. There is no significant pleural effusion. There is prominent thoracic kyphosis and there is mild levocurvature of the thoracolumbar junction.  IMPRESSION: COPD and cardiomegaly. There is no pulmonary edema or alveolar pneumonia. Subsegmental atelectasis is present laterally overlying right cardiophrenic angle.   Electronically Signed   By: David  Martinique M.D.   On: 03/18/2015 14:37    PMH:   Past Medical History  Diagnosis Date  . Mitral valve disease   . Mitral regurgitation   . Hypothyroidism   . B12 deficiency   . Heart murmur   . Allergy   .  OA (osteoarthritis)   . Migraines   . Atrial fibrillation ([place])     pt declines anticoag (jehovah's witness)  . Cardiomegaly     on CT scan 03/2011    PSH:   Past Surgical History  Procedure Laterality Date  . Tonsillectomy    . Nasal endoscopy with epistaxis control  07/15/2012    Procedure: NASAL ENDOSCOPY WITH EPISTAXIS CONTROL;  Surgeon: Izora Gala, MD;  Location: [facility];  Service: ENT;  Laterality: N/A;    Allergies:  Allergies  Allergen Reactions  . Amlodipine Other (See Comments)    Pain in jaw, and peripheral edema  . Other Other (See Comments)    PT IS OF [place]. NO BLOOD PRODUCTS.  . Ramipril Other (See Comments)    Dyspnea - Tolerates lisinopril  . Cozaar Other (See Comments)    insomnia  . Edecrin [Ethacrynic Acid] Nausea And Vomiting  . Hctz [Hydrochlorothiazide] Rash    Takes Triamterene/HCTZ at home.  . Sulfa Antibiotics Itching    Medications:   Prior to Admission medications   Medication Sig Start Date End Date Taking? Authorizing Provider  aspirin 81 MG tablet Take 81 mg by mouth. Mondays, Wednesdays, and Fridays   Yes Historical Provider, MD   bumetanide (BUMEX) 1 MG tablet Take 1 mg by mouth 2 (two) times daily.   Yes Historical Provider, MD  CARTIA XT 120 MG 24 hr capsule TAKE 1 CAPSULE BY MOUTH DAILY 01/07/15  Yes Darlin Coco, MD  levothyroxine (SYNTHROID, LEVOTHROID) 50 MCG tablet TAKE ONE TABLET BY MOUTH ONE TIME DAILY 07/09/11  Yes Rowe Clack, MD  metoprolol succinate (TOPROL-XL) 25 MG 24 hr tablet TAKE 1 AND 1/2 TABLETS(37.5 MG) BY MOUTH DAILY 11/24/14  Yes Darlin Coco, MD  spironolactone (ALDACTONE) 25 MG tablet Take 0.5 tablets (12.5 mg total) by mouth daily. 03/12/15  Yes Darlin Coco, MD    Discontinued Meds:   Medications Discontinued During This Encounter  Medication Reason  . sodium chloride (hypertonic) 3 % solution Duplicate  . sodium chloride (hypertonic) 3 % solution Duplicate  . sodium chloride (hypertonic) 3 % solution Duplicate  . sodium chloride (hypertonic) 3 % solution   . B Complex Vitamins (VITAMIN-B COMPLEX PO) Patient has not taken in last 30 days  . cholecalciferol (VITAMIN D) 1000 UNITS tablet Patient has not taken in last 30 days  . Coenzyme Q10 (COQ10 PO) Patient has not taken in last 30 days  . latanoprost (XALATAN) 0.005 % ophthalmic solution Patient has not taken in last 30 days  . sodium chloride (OCEAN) 0.65 % SOLN nasal spray Patient has not taken in last 30 days  . furosemide (LASIX) injection 40 mg   . acetaminophen (TYLENOL) tablet 952 mg Duplicate  . ondansetron (ZOFRAN) injection 4 mg Duplicate    Social History:  reports that she has quit smoking. She has never used smokeless tobacco. She reports that she drinks alcohol. She reports that she does not use illicit drugs.  Family History:   Family History  Problem Relation Age of Onset  . Stroke Mother   . Hypertension Mother   . Arthritis Father   . Stomach cancer Father     Blood pressure 157/77, pulse 64, temperature 97.3 F (36.3 C), temperature source Axillary, resp. rate 17, height $RemoveBe'5\' 2"'[id number]$  (1.575 m),  weight 50.349 kg (111 lb), SpO2 99 %. General appearance: alert, appears stated age and cachectic Head: Normocephalic, without obvious abnormality, atraumatic Eyes: negative Neck: no adenopathy, no carotid bruit,  supple, symmetrical, trachea midline, thyroid not enlarged, symmetric, no tenderness/mass/nodules and JVD present Back: symmetric, no curvature. ROM normal. No CVA tenderness. Resp: poor air movement but no rales noted Chest wall: no tenderness Cardio: irregularly irregular rhythm GI: hepatomegaly present Extremities: edema 2-3+ lower ext Pulses: 2+ and symmetric Skin: Skin color, texture, turgor normal. No rashes or lesions Lymph nodes: Cervical, supraclavicular, and axillary nodes normal. Neurologic: Grossly normal       Deaire Mcwhirter, Hunt Oris, MD 03/18/2015, 9:22 PM

## 2015-03-18 NOTE — ED Notes (Signed)
CRITICAL VALUE ALERT  Critical value received: BNP 1123  Date of notification:  03/18/2015  Time of notification:  17:36  Critical value read back: yes  Nurse who received alert:  Corky Downs RN

## 2015-03-18 NOTE — ED Notes (Signed)
Patient has history of CHF, Patient saw PCP today and was told to come to the ED due to worsening short of breathe

## 2015-03-18 NOTE — ED Provider Notes (Signed)
Medical screening examination/treatment/procedure(s) were conducted as a shared visit with non-physician practitioner(s) and myself.  I personally evaluated the patient during the encounter.   EKG Interpretation   Date/Time:  Wednesday March 18 2015 15:48:41 EDT Ventricular Rate:  67 PR Interval:    QRS Duration: 139 QT Interval:  482 QTC Calculation: 509 R Axis:   -76 Text Interpretation:  Atrial fibrillation Nonspecific IVCD with LAD LVH  with secondary repolarization abnormality Anterior infarct, old No  significant change since last tracing Confirmed by Jamani Bearce  MD, Laine Fonner  ([id number]) on 03/18/2015 4:13:23 PM     85yF with dyspnea. Pt is very drowsy on my exam and difficult to obtain a good history. Needs light tactile stimulation to wake up and will fall asleep in mid sentence. W/u significant for Na of 120. Pt apparently has been having hallucinations. 100cc of 3% saline ordered because of mental status changes to this degree, but do not want to correct too rapidly. Reassess after this.  Fluid restrict while in ED. Additional labs/urine studies ordered. Recent labs with mild hyponatremia, but not to this degree. On aldactone and bumex. Med related? Does not seem to be overtly volume overloaded currently. Needs admission.   CRITICAL CARE Performed by: Virgel Manifold Total critical care time: 35 minutes Critical care time was exclusive of separately billable procedures and treating other patients. Critical care was necessary to treat or prevent imminent or life-threatening deterioration. Critical care was time spent personally by me on the following activities: development of treatment plan with patient and/or surrogate as well as nursing, discussions with consultants, evaluation of patient's response to treatment, examination of patient, obtaining history from patient or surrogate, ordering and performing treatments and interventions, ordering and review of laboratory studies, ordering and  review of radiographic studies, pulse oximetry and re-evaluation of patient's condition.   Virgel Manifold, MD 03/18/15 [phone]

## 2015-03-18 NOTE — Consult Note (Signed)
CONSULTATION NOTE  Reason for Consult: CHF, hyponatremia  Requesting Physician: Dr. Janee [name]  Cardiologist: Dr. Patty [name]  HPI: This is a 81 y.o. female with a past medical history significant for chronic atrial fibrillation, hypertension and pulmonary hypertension. She was seen by Dr. Patty [name] in the office on 03/13/2015. He remarked as she is a Scientist, product/process development, she is refusing any anticoagulation because of concerns about bleeding and her desire to avoid blood products. She has had a significant history of epistaxis in the past. Last echo in 2015 showed an EF of 50-55% with LVH, mild to moderate AI, moderate to severe MR and severe TR with an elevated pulmonary pressure 64 mmHg. She has marked biatrial enlargement. Despite her multiple valvular heart disease, does not sound that she has been considered for heart surgery. She does have a history of peripheral edema which is secondary to right heart failure. Dr. Patty [name] had a discussion with her and felt that her condition was expected to worsen slowly over time and that due to frailty and refusal of anticoagulation or blood products she would not be a candidate for surgery.  She now presents as a referral in from the office today from her primary care provider due to worsening shortness of breath and swelling. In addition she's had hallucinations on and off over the past day. On admission she had marked laboratory abnormalities including an elevated BNP at 1123, marked hyponatremia with a sodium of 120, down from 131 on 03/13/2015. Chest x-ray shows COPD and cardiomegaly without obvious lung infiltrates or edema. EKG shows atrial fibrillation with controlled ventricular response and nonspecific IVCD/left bundle branch pattern. Cardiology is asked to evaluate for acute on chronic right heart failure.  PMHx:  Past Medical History  Diagnosis Date  . Mitral valve disease   . Mitral regurgitation   . Hypothyroidism   . B12 deficiency     . Heart murmur   . Allergy   . OA (osteoarthritis)   . Migraines   . Atrial fibrillation (HCC)     pt declines anticoag (jehovah's witness)  . Cardiomegaly     on CT scan 03/2011   Past Surgical History  Procedure Laterality Date  . Tonsillectomy    . Nasal endoscopy with epistaxis control  07/15/2012    Procedure: NASAL ENDOSCOPY WITH EPISTAXIS CONTROL;  Surgeon: Serena Colonel, MD;  Location: [facility];  Service: ENT;  Laterality: N/A;    FAMHx: Family History  Problem Relation Age of Onset  . Stroke Mother   . Hypertension Mother   . Arthritis Father   . Stomach cancer Father     SOCHx:  reports that she has quit smoking. She has never used smokeless tobacco. She reports that she drinks alcohol. She reports that she does not use illicit drugs.  ALLERGIES: Allergies  Allergen Reactions  . Amlodipine Other (See Comments)    Pain in jaw, and peripheral edema  . Other Other (See Comments)    PT IS OF JEHOVAH WITNESS FAITH. NO BLOOD PRODUCTS.  . Ramipril Other (See Comments)    Dyspnea - Tolerates lisinopril  . Cozaar Other (See Comments)    insomnia  . Edecrin [Ethacrynic Acid] Nausea And Vomiting  . Hctz [Hydrochlorothiazide] Rash    Takes Triamterene/HCTZ at home.  . Lasix [Furosemide] Itching  . Sulfa Antibiotics Itching    ROS: Review of systems not obtained due to patient factors. (patient very sleepy, confused)  HOME MEDICATIONS:   Medication List    ASK your  doctor about these medications        aspirin 81 MG tablet  Take 81 mg by mouth. Mondays, Wednesdays, and Fridays     bumetanide 1 MG tablet  Commonly known as:  BUMEX  Take 1 mg by mouth 2 (two) times daily.     CARTIA XT 120 MG 24 hr capsule  Generic drug:  diltiazem  TAKE 1 CAPSULE BY MOUTH DAILY     levothyroxine 50 MCG tablet  Commonly known as:  SYNTHROID, LEVOTHROID  TAKE ONE TABLET BY MOUTH ONE TIME DAILY     metoprolol succinate 25 MG 24 hr tablet  Commonly known as:  TOPROL-XL   TAKE 1 AND 1/2 TABLETS(37.5 MG) BY MOUTH DAILY     spironolactone 25 MG tablet  Commonly known as:  ALDACTONE  Take 0.5 tablets (12.5 mg total) by mouth daily.        HOSPITAL MEDICATIONS: I have reviewed the patient's current medications.  VITALS: Blood pressure 145/73, pulse 56, temperature 94.3 F (34.6 C), temperature source Rectal, resp. rate 16, height $RemoveBe'5\' 2"'rwVNnfSfB$  (1.575 m), weight 111 lb (50.349 kg), SpO2 97 %.  PHYSICAL EXAM: General appearance: confused, slow mentation, sleepy Neck: JVD - 10 cm above sternal notch and no carotid bruit Lungs: diminished breath sounds bibasilar Heart: irregularly irregular rhythm, S1, S2 normal, systolic murmur: early systolic 3/6, crescendo at 2nd right intercostal space and 2nd systolic murmur: holosystolic 3/6, blowing at 2nd left intercostal space Abdomen: soft, mildly protuberant, +AJR Extremities: 3+ pitting edema to thighs Pulses: 2+ and symmetric Skin: pale, cool, dry Neurologic: Mental status: confused, somewhat somnolent Psych: Recently hallucinating  LABS: Results for orders placed or performed during the hospital encounter of 03/18/15 (from the past 48 hour(s))  Basic metabolic panel     Status: Abnormal   Collection Time: 03/18/15  1:32 PM  Result Value Ref Range   Sodium 120 (L) 135 - 145 mmol/L   Potassium 3.5 3.5 - 5.1 mmol/L   Chloride 77 (L) 101 - 111 mmol/L   CO2 31 22 - 32 mmol/L   Glucose, Bld 113 (H) 65 - 99 mg/dL   BUN 33 (H) 6 - 20 mg/dL   Creatinine, Ser 1.37 (H) 0.44 - 1.00 mg/dL   Calcium 9.3 8.9 - 10.3 mg/dL   GFR calc non Af Amer 35 (L) >60 mL/min   GFR calc Af Amer 40 (L) >60 mL/min    Comment: (NOTE) The eGFR has been calculated using the CKD EPI equation. This calculation has not been validated in all clinical situations. eGFR's persistently <60 mL/min signify possible Chronic Kidney Disease.    Anion gap 12 5 - 15  CBC     Status: Abnormal   Collection Time: 03/18/15  1:32 PM  Result Value Ref  Range   WBC 5.7 4.0 - 10.5 K/uL   RBC 4.53 3.87 - 5.11 MIL/uL   Hemoglobin 12.2 12.0 - 15.0 g/dL   HCT 37.1 36.0 - 46.0 %   MCV 81.9 78.0 - 100.0 fL   MCH 26.9 26.0 - 34.0 pg   MCHC 32.9 30.0 - 36.0 g/dL   RDW 15.7 (H) 11.5 - 15.5 %   Platelets 163 150 - 400 K/uL  Brain natriuretic peptide     Status: Abnormal   Collection Time: 03/18/15  3:57 PM  Result Value Ref Range   B Natriuretic Peptide 1123.0 (H) 0.0 - 100.0 pg/mL    Comment: CALLED TO C KING,RN 1726 03/18/15 D BRADLEY CORRECTED ON 10/05  AT 1725: PREVIOUSLY REPORTED AS A   I-stat troponin, ED     Status: None   Collection Time: 03/18/15  4:04 PM  Result Value Ref Range   Troponin i, poc 0.07 0.00 - 0.08 ng/mL   Comment 3            Comment: Due to the release kinetics of cTnI, a negative result within the first hours of the onset of symptoms does not rule out myocardial infarction with certainty. If myocardial infarction is still suspected, repeat the test at appropriate intervals.   Sodium, urine, random     Status: None   Collection Time: 03/18/15  5:30 PM  Result Value Ref Range   Sodium, Ur 56 mmol/L  Creatinine, urine, random     Status: None   Collection Time: 03/18/15  5:30 PM  Result Value Ref Range   Creatinine, Urine 19.95 mg/dL  Urinalysis, Routine w reflex microscopic (not at [facility])     Status: Abnormal   Collection Time: 03/18/15  5:30 PM  Result Value Ref Range   Color, Urine YELLOW YELLOW   APPearance CLEAR CLEAR   Specific Gravity, Urine <1.005 (L) 1.005 - 1.030   pH 7.0 5.0 - 8.0   Glucose, UA NEGATIVE NEGATIVE mg/dL   Hgb urine dipstick NEGATIVE NEGATIVE   Bilirubin Urine NEGATIVE NEGATIVE   Ketones, ur NEGATIVE NEGATIVE mg/dL   Protein, ur NEGATIVE NEGATIVE mg/dL   Urobilinogen, UA 0.2 0.0 - 1.0 mg/dL   Nitrite NEGATIVE NEGATIVE   Leukocytes, UA NEGATIVE NEGATIVE    Comment: MICROSCOPIC NOT DONE ON URINES WITH NEGATIVE PROTEIN, BLOOD, LEUKOCYTES, NITRITE, OR GLUCOSE <1000 mg/dL.     IMAGING: Dg Chest 2 View  03/18/2015   CLINICAL DATA:  Shortness of breath several days, weakness, history of CHF, valvular heart disease, pulmonary hypertension, previous history of tobacco use.  EXAM: CHEST  2 VIEW  COMPARISON:  PA and lateral chest x-ray of June 07, 2014  FINDINGS: The lungs remain hyperinflated with hemidiaphragm flattening and increased AP dimension of the thorax. There is minimal density projecting over the right lateral costophrenic gutter. The cardiac silhouette remains enlarged. The pulmonary vascularity is not engorged. There is no significant pleural effusion. There is prominent thoracic kyphosis and there is mild levocurvature of the thoracolumbar junction.  IMPRESSION: COPD and cardiomegaly. There is no pulmonary edema or alveolar pneumonia. Subsegmental atelectasis is present laterally overlying right cardiophrenic angle.   Electronically Signed   By: David  Martinique M.D.   On: 03/18/2015 14:37    HOSPITAL DIAGNOSES: Principal Problem:   Hyponatremia Active Problems:   Hypothyroidism   Essential hypertension   Atrial fibrillation (HCC)   GERD   Tricuspid regurgitation   Pulmonary hypertension (HCC)   Acute on chronic diastolic CHF (congestive heart failure) (HCC)   Chronic a-fib (HCC)   Mitral regurgitation, moderate to severe   Chronic kidney disease, stage III (moderate)   SOB (shortness of breath)   Hypothermia   Encephalopathy, metabolic   IMPRESSION: 1. Acute right heart failure on chronic diastolic heart failure 2. Chronic a-fib - rate controlled, CHADSVASC 5 (refused anticoagulation) 3. Jehovah's witness - refusing blood products 4. Multi-valve disease - not an operative candidate due to frailty 5. Hypervolemic hyponatremia with hallucinations  RECOMMENDATION: 1. [name] [name] is in decompensated right heart failure with severe hyponatremia. This is developed over the past week. She is significantly volume overloaded and will not tolerate  hypertonic saline. She will need diuresis and would likely benefit from  the addition of tolvaptan to aid in aquaresis. I think based on the mechanism of her heart failure and the SALT trial data, she is a good candidate for this medication.  Discussed with pharmacy and placed orders through order-set. Will place foley due to aggressive diuresis. When she has more capacity for decision making, we need to have a discussion with her regarding goals of care and code status. For now, she has indicated full code.  Cardiology will follow closely with you. Thanks for the consultation.  Time Spent Directly with Patient: 60 minutes  Pixie Casino, MD, [facility] Attending Cardiologist [place] 03/18/2015, 6:52 PM

## 2015-03-19 ENCOUNTER — Inpatient Hospital Stay (HOSPITAL_COMMUNITY): Payer: Medicare Other

## 2015-03-19 DIAGNOSIS — T68XXXD Hypothermia, subsequent encounter: Secondary | ICD-10-CM

## 2015-03-19 DIAGNOSIS — R0600 Dyspnea, unspecified: Secondary | ICD-10-CM

## 2015-03-19 LAB — COMPREHENSIVE METABOLIC PANEL
ALBUMIN: 3.3 g/dL — AB (ref 3.5–5.0)
ALBUMIN: 3.4 g/dL — AB (ref 3.5–5.0)
ALK PHOS: 81 U/L (ref 38–126)
ALK PHOS: 83 U/L (ref 38–126)
ALT: 21 U/L (ref 14–54)
ALT: 22 U/L (ref 14–54)
ANION GAP: 11 (ref 5–15)
ANION GAP: 11 (ref 5–15)
AST: 51 U/L — ABNORMAL HIGH (ref 15–41)
AST: 44 U/L — ABNORMAL HIGH (ref 15–41)
BILIRUBIN TOTAL: 1.1 mg/dL (ref 0.3–1.2)
BILIRUBIN TOTAL: 1.2 mg/dL (ref 0.3–1.2)
BUN: 34 mg/dL — ABNORMAL HIGH (ref 6–20)
BUN: 39 mg/dL — ABNORMAL HIGH (ref 6–20)
CALCIUM: 8.8 mg/dL — AB (ref 8.9–10.3)
CALCIUM: 9.2 mg/dL (ref 8.9–10.3)
CO2: 33 mmol/L — ABNORMAL HIGH (ref 22–32)
CO2: 34 mmol/L — ABNORMAL HIGH (ref 22–32)
CREATININE: 1.4 mg/dL — AB (ref 0.44–1.00)
Chloride: 79 mmol/L — ABNORMAL LOW (ref 101–111)
Chloride: 84 mmol/L — ABNORMAL LOW (ref 101–111)
Creatinine, Ser: 1.49 mg/dL — ABNORMAL HIGH (ref 0.44–1.00)
GFR calc Af Amer: 39 mL/min — ABNORMAL LOW (ref >60)
GFR calc non Af Amer: 34 mL/min — ABNORMAL LOW (ref >60)
GFR, EST AFRICAN AMERICAN: 36 mL/min — AB (ref >60)
GFR, EST NON AFRICAN AMERICAN: 31 mL/min — AB (ref >60)
GLUCOSE: 79 mg/dL (ref 65–99)
GLUCOSE: 86 mg/dL (ref 65–99)
POTASSIUM: 3.4 mmol/L — AB (ref 3.5–5.1)
Potassium: 3.9 mmol/L (ref 3.5–5.1)
Sodium: 123 mmol/L — ABNORMAL LOW (ref 135–145)
Sodium: 129 mmol/L — ABNORMAL LOW (ref 135–145)
TOTAL PROTEIN: 5.7 g/dL — AB (ref 6.5–8.1)
TOTAL PROTEIN: 6 g/dL — AB (ref 6.5–8.1)

## 2015-03-19 LAB — UREA NITROGEN, URINE: Urea Nitrogen, Ur: 217 mg/dL

## 2015-03-19 LAB — BASIC METABOLIC PANEL
ANION GAP: 10 (ref 5–15)
ANION GAP: 11 (ref 5–15)
ANION GAP: 11 (ref 5–15)
BUN: 33 mg/dL — ABNORMAL HIGH (ref 6–20)
BUN: 38 mg/dL — ABNORMAL HIGH (ref 6–20)
BUN: 46 mg/dL — ABNORMAL HIGH (ref 6–20)
CALCIUM: 8.7 mg/dL — AB (ref 8.9–10.3)
CALCIUM: 9 mg/dL (ref 8.9–10.3)
CALCIUM: 9.1 mg/dL (ref 8.9–10.3)
CO2: 32 mmol/L (ref 22–32)
CO2: 34 mmol/L — AB (ref 22–32)
CO2: 35 mmol/L — ABNORMAL HIGH (ref 22–32)
CREATININE: 1.53 mg/dL — AB (ref 0.44–1.00)
CREATININE: 1.78 mg/dL — AB (ref 0.44–1.00)
CREATININE: 1.46 mg/dL — AB (ref 0.44–1.00)
Chloride: 77 mmol/L — ABNORMAL LOW (ref 101–111)
Chloride: 82 mmol/L — ABNORMAL LOW (ref 101–111)
Chloride: 83 mmol/L — ABNORMAL LOW (ref 101–111)
GFR calc Af Amer: 29 mL/min — ABNORMAL LOW (ref >60)
GFR calc Af Amer: 37 mL/min — ABNORMAL LOW (ref >60)
GFR calc non Af Amer: 32 mL/min — ABNORMAL LOW (ref >60)
GFR, EST AFRICAN AMERICAN: 35 mL/min — AB (ref >60)
GFR, EST NON AFRICAN AMERICAN: 30 mL/min — AB (ref >60)
GFR, EST NON AFRICAN AMERICAN: 25 mL/min — AB (ref >60)
GLUCOSE: 101 mg/dL — AB (ref 65–99)
GLUCOSE: 117 mg/dL — AB (ref 65–99)
GLUCOSE: 137 mg/dL — AB (ref 65–99)
Potassium: 3.4 mmol/L — ABNORMAL LOW (ref 3.5–5.1)
Potassium: 3.6 mmol/L (ref 3.5–5.1)
Potassium: 4 mmol/L (ref 3.5–5.1)
Sodium: 122 mmol/L — ABNORMAL LOW (ref 135–145)
Sodium: 126 mmol/L — ABNORMAL LOW (ref 135–145)
Sodium: 127 mmol/L — ABNORMAL LOW (ref 135–145)

## 2015-03-19 LAB — CBC
HCT: 36.4 % (ref 36.0–46.0)
HEMOGLOBIN: 12 g/dL (ref 12.0–15.0)
MCH: 27.2 pg (ref 26.0–34.0)
MCHC: 33 g/dL (ref 30.0–36.0)
MCV: 82.5 fL (ref 78.0–100.0)
PLATELETS: 167 10*3/uL (ref 150–400)
RBC: 4.41 MIL/uL (ref 3.87–5.11)
RDW: 15.6 % — AB (ref 11.5–15.5)
WBC: 6.4 10*3/uL (ref 4.0–10.5)

## 2015-03-19 LAB — BRAIN NATRIURETIC PEPTIDE: B Natriuretic Peptide: 1487 pg/mL — ABNORMAL HIGH (ref 0.0–100.0)

## 2015-03-19 LAB — MAGNESIUM: Magnesium: 1.8 mg/dL (ref 1.7–2.4)

## 2015-03-19 LAB — PHOSPHORUS: Phosphorus: 3.1 mg/dL (ref 2.5–4.6)

## 2015-03-19 LAB — OSMOLALITY, URINE: Osmolality, Ur: 262 mOsm/kg — ABNORMAL LOW (ref 390–1090)

## 2015-03-19 LAB — TROPONIN I
TROPONIN I: 0.1 ng/mL — AB (ref <0.031)
Troponin I: 0.08 ng/mL — ABNORMAL HIGH (ref <0.031)

## 2015-03-19 LAB — SODIUM, URINE, RANDOM: SODIUM UR: 10 mmol/L

## 2015-03-19 LAB — OSMOLALITY: Osmolality: 266 mOsm/kg — ABNORMAL LOW (ref 275–300)

## 2015-03-19 MED ORDER — METOPROLOL SUCCINATE ER 25 MG PO TB24
25.0000 mg | ORAL_TABLET | Freq: Every day | ORAL | Status: DC
  Administered 2015-03-20: 25 mg via ORAL
  Filled 2015-03-19 (×2): qty 1

## 2015-03-19 MED ORDER — FUROSEMIDE 80 MG PO TABS
80.0000 mg | ORAL_TABLET | Freq: Two times a day (BID) | ORAL | Status: DC
  Administered 2015-03-19: 80 mg via ORAL
  Filled 2015-03-19: qty 1

## 2015-03-19 NOTE — Progress Notes (Signed)
[facility] PROGRESS NOTE  [name] [name] VZD:[id number] DOB: Feb 22, 1930 DOA: 03/18/2015 PCP: Lilian Coma, MD  Assessment/Plan: #1 severe acute on chronic hyponatremia Likely secondary to hypervolemic hyponatremia. Serum osmolality was low at 266. Urine osmolality was low at 262. Specific gravity was less than 1.005. Urine sodium was 56. Urine creatinine 19.95. TSH within normal limits at 2.889. Random cortisol level was 20.8. CT head negative for any acute abnormalities. Chest x-ray negative for any acute abnormalities. Serum sodium now at 123 from 120 on admission. Patient has been seen in consultation by nephrology and patient has been started on tolvaptan. Continue diuresis with Lasix 40 mg IV every 12 hours. Serial BMETs. Will fluid restrict to 1000 mL per day. Nephrology following and appreciate input and recommendations.  #2 acute on chronic right-sided diastolic heart failure Questionable etiology. Troponins slightly elevated however likely secondary to CHF exacerbation. 2-D echo is pending. Continue strict I's and O. Daily weights. BNP elevated at 1487 from 1123 on admission. Patient is -820 mL since admission over the past 12 hours. Continue aspirin, Cardizem, metoprolol, IV Lasix. Cardiology following and appreciate input and recommendations.  #3 acute metabolic encephalopathy Likely secondary to problem #1. Patient alert to self and place. Patient was drowsy earlier on however easily arousable and stated she was just tired. Patient up and alert and sitting in the chair with help of physical therapy. Patient denies any further hallucinations. CT head is negative. Patient has been pancultured cultures are pending. No signs or symptoms of infection. TSH within normal limits. Continue treatment as in problem #1.  #4 hypothermia Unknown etiology. Patient is currently normothermic. Urinalysis is negative for UTI. Chest x-ray negative for any acute infiltrates. CT head is negative.  Blood cultures are pending. Follow. Supportive care.  #5 chronic atrial fibrillation CHADS2VASC score 5. Continue Cardizem and metoprolol for rate control. Patient has refused anticoagulation in the past secondary to being a Jehovah's Witness. Aspirin.  #6 multi-valvular disease Patient did not a operative candidate secondary to frailty and refusal of blood products due to been a Jehovah's Witness. 2-D echo pending. Continue current medical management with aspirin, Cardizem, metoprolol, Lasix. Cardiology following.  #7 chronic kidney disease stage III Stable.  #8 hypothyroidism TSH within normal limits. Continue home dose Synthroid.  #9 hypertension Continue Cardizem and metoprolol.  #10 prophylaxis Lovenox for DVT prophylaxis.  Code Status: Full Family Communication: Updated patient. No family at bedside. Disposition Plan: Remain in the step down unit.   Consultants:  Cardiology: Dr. Debara Pickett 03/18/2015  Nephrology: Dr. Augustin Coupe 03/18/2015  Procedures:  CT head 03/18/2015  Chest x-ray 03/18/2015  Antibiotics:  None  HPI/Subjective: Patient sleeping. Drowsy. Easily arousable. Alert to self and place. Falls back asleep. Patient states shortness of breath is improved from admission. Patient denies any chest pain. Patient denies any hallucinations.  Objective: Filed Vitals:   03/19/15 0744  BP: 123/67  Pulse: 76  Temp: 98.1 F (36.7 C)  Resp: 18    Intake/Output Summary (Last 24 hours) at 03/19/15 0830 Last data filed at 03/18/15 2115  Gross per 24 hour  Intake      0 ml  Output    820 ml  Net   -820 ml   Filed Weights   03/18/15 1323 03/19/15 0500  Weight: 50.349 kg (111 lb) 52.5 kg (115 lb 11.9 oz)    Exam:   General:  Drowsy.  Cardiovascular: Irregularly irregular. 3/6 SEM murmur. + JVD  Respiratory: Clear to auscultation bilaterally  Abdomen: Soft, nontender, nondistended,  positive bowel sounds  Musculoskeletal: No clubbing or cyanosis. 2-3+  bilateral lower extremity edema up to thighs improving.  Data Reviewed: Basic Metabolic Panel:  Recent Labs Lab 03/13/15 1158 03/18/15 1332 03/18/15 1935 03/18/15 2342 03/19/15 0320  NA 131* 120* 122* 122* 123*  K 3.6 3.5 3.6 3.6 3.9  CL 86* 77* 78* 77* 79*  CO2 38* 31 33* 34* 33*  GLUCOSE 90 113* 91 137* 79  BUN 34* 33* 32* 33* 34*  CREATININE 1.39* 1.37* 1.34* 1.46* 1.40*  CALCIUM 9.9 9.3 9.2 9.1 8.8*  MG  --   --   --  1.8  --    Liver Function Tests:  Recent Labs Lab 03/19/15 0320  AST 51*  ALT 21  ALKPHOS 81  BILITOT 1.1  PROT 5.7*  ALBUMIN 3.3*   No results for input(s): LIPASE, AMYLASE in the last 168 hours. No results for input(s): AMMONIA in the last 168 hours. CBC:  Recent Labs Lab 03/18/15 1332 03/19/15 0320  WBC 5.7 6.4  HGB 12.2 12.0  HCT 37.1 36.4  MCV 81.9 82.5  PLT 163 167   Cardiac Enzymes:  Recent Labs Lab 03/18/15 1935 03/18/15 2342 03/19/15 0545  TROPONINI 0.08* 0.08* 0.10*   BNP (last 3 results)  Recent Labs  06/14/14 0419 03/18/15 1557 03/19/15 0545  BNP 428.8* 1123.0* 1487.0*    ProBNP (last 3 results) No results for input(s): PROBNP in the last 8760 hours.  CBG: No results for input(s): GLUCAP in the last 168 hours.  Recent Results (from the past 240 hour(s))  MRSA PCR Screening     Status: None   Collection Time: 03/18/15  7:26 PM  Result Value Ref Range Status   MRSA by PCR NEGATIVE NEGATIVE Final    Comment:        The GeneXpert MRSA Assay (FDA approved for NASAL specimens only), is one component of a comprehensive MRSA colonization surveillance program. It is not intended to diagnose MRSA infection nor to guide or monitor treatment for MRSA infections.   Blood culture (routine x 2)     Status: None (Preliminary result)   Collection Time: 03/18/15  7:35 PM  Result Value Ref Range Status   Specimen Description BLOOD LEFT WRIST  Final   Special Requests IN PEDIATRIC BOTTLE 2CC  Final   Culture  PENDING  Incomplete   Report Status PENDING  Incomplete     Studies: Dg Chest 2 View  03/18/2015   CLINICAL DATA:  Shortness of breath several days, weakness, history of CHF, valvular heart disease, pulmonary hypertension, previous history of tobacco use.  EXAM: CHEST  2 VIEW  COMPARISON:  PA and lateral chest x-ray of June 07, 2014  FINDINGS: The lungs remain hyperinflated with hemidiaphragm flattening and increased AP dimension of the thorax. There is minimal density projecting over the right lateral costophrenic gutter. The cardiac silhouette remains enlarged. The pulmonary vascularity is not engorged. There is no significant pleural effusion. There is prominent thoracic kyphosis and there is mild levocurvature of the thoracolumbar junction.  IMPRESSION: COPD and cardiomegaly. There is no pulmonary edema or alveolar pneumonia. Subsegmental atelectasis is present laterally overlying right cardiophrenic angle.   Electronically Signed   By: David  Martinique M.D.   On: 03/18/2015 14:37   Ct Head Wo Contrast  03/18/2015   CLINICAL DATA:  Acute encephalopathy.  Altered mental status.  EXAM: CT HEAD WITHOUT CONTRAST  TECHNIQUE: Contiguous axial images were obtained from the base of the skull through the vertex  without intravenous contrast.  COMPARISON:  05/29/2006  FINDINGS: There is no intracranial hemorrhage, mass or evidence of acute infarction. There is mild generalized atrophy. There is mild chronic microvascular ischemic change. There is no significant extra-axial fluid collection.  No acute intracranial findings are evident. The visible paranasal sinuses are clear. Mild chronic sclerosis of the right mastoid air cells.  IMPRESSION: Mild generalized atrophy.  No acute intracranial findings.   Electronically Signed   By: Andreas Newport M.D.   On: 03/18/2015 23:26    Scheduled Meds: . [START ON 03/20/2015] aspirin EC  81 mg Oral Q M,W,F  . diltiazem  120 mg Oral Daily  . enoxaparin (LOVENOX)  injection  30 mg Subcutaneous Q24H  . furosemide  40 mg Intravenous Q12H  . levothyroxine  50 mcg Oral Daily  . metoprolol succinate  37.5 mg Oral Daily  . senna-docusate  1 tablet Oral QHS  . sodium chloride  3 mL Intravenous Q12H  . sodium chloride  3 mL Intravenous Q12H  . sodium chloride  3 mL Intravenous Q12H  . tolvaptan  15 mg Oral Q24H   Continuous Infusions:   Principal Problem:   Hyponatremia Active Problems:   Acute on chronic diastolic CHF (congestive heart failure) (HCC)   Hypothermia   Encephalopathy, metabolic   Hypothyroidism   Essential hypertension   Atrial fibrillation (HCC)   GERD   Tricuspid regurgitation   Pulmonary hypertension (HCC)   Chronic a-fib (HCC)   Mitral regurgitation, moderate to severe   Chronic kidney disease, stage III (moderate)   SOB (shortness of breath)    Time spent: 10 minutes    Judd Mccubbin M.D. [facility] Pager [phone]. If 7PM-7AM, please contact night-coverage at [email or web address], password [email or web address] 03/19/2015, 8:30 AM  LOS: 1 day

## 2015-03-19 NOTE — Care Management Note (Signed)
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  Friends are currently caring for sister and she is concerned about their ability and willingness to continue.  Pt plans to call the friend who is coordinating her sister's care and requested CM check back tomorrow for her decision.   CM provided information about respite care                             Keyshon Stein, Kym Groom, RN 03/19/2015, 12:48 PM

## 2015-03-19 NOTE — Progress Notes (Signed)
Subjective: Interval History: has no complaint, knows she has swelling and not SOB.  Objective: Vital signs in last 24 hours: Temp:  [94.3 F (34.6 C)-98.1 F (36.7 C)] 98.1 F (36.7 C) (10/06 0744) Pulse Rate:  [56-90] 71 (10/06 1017) Resp:  [12-23] 18 (10/06 0744) BP: (119-164)/(63-93) 123/67 mmHg (10/06 0744) SpO2:  [90 %-100 %] 99 % (10/06 0744) Weight:  [48.762 kg (107 lb 8 oz)-52.5 kg (115 lb 11.9 oz)] 48.762 kg (107 lb 8 oz) (10/06 1019) Weight change:   Intake/Output from previous day: 10/05 0701 - 10/06 0700 In: -  Out: 820 [Urine:820] Intake/Output this shift: Total I/O In: 363 [P.O.:360; I.V.:3] Out: -   General appearance: alert, cooperative and pale Resp: rales bibasilar Cardio: S1, S2 normal and systolic murmur: holosystolic 2/6, blowing at apex GI: pos bs, soft, liver down 5 cm Extremities: edema 3+  Lab Results:  Recent Labs  03/18/15 1332 03/19/15 0320  WBC 5.7 6.4  HGB 12.2 12.0  HCT 37.1 36.4  PLT 163 167   BMET:  Recent Labs  03/19/15 0320 03/19/15 0920  NA 123* 126*  K 3.9 4.0  CL 79* 83*  CO2 33* 32  GLUCOSE 79 101*  BUN 34* 38*  CREATININE 1.40* 1.53*  CALCIUM 8.8* 9.0   No results for input(s): PTH in the last 72 hours. Iron Studies: No results for input(s): IRON, TIBC, TRANSFERRIN, FERRITIN in the last 72 hours.  Studies/Results: Dg Chest 2 View  03/18/2015   CLINICAL DATA:  Shortness of breath several days, weakness, history of CHF, valvular heart disease, pulmonary hypertension, previous history of tobacco use.  EXAM: CHEST  2 VIEW  COMPARISON:  PA and lateral chest x-ray of June 07, 2014  FINDINGS: The lungs remain hyperinflated with hemidiaphragm flattening and increased AP dimension of the thorax. There is minimal density projecting over the right lateral costophrenic gutter. The cardiac silhouette remains enlarged. The pulmonary vascularity is not engorged. There is no significant pleural effusion. There is prominent  thoracic kyphosis and there is mild levocurvature of the thoracolumbar junction.  IMPRESSION: COPD and cardiomegaly. There is no pulmonary edema or alveolar pneumonia. Subsegmental atelectasis is present laterally overlying right cardiophrenic angle.   Electronically Signed   By: David  Martinique M.D.   On: 03/18/2015 14:37   Ct Head Wo Contrast  03/18/2015   CLINICAL DATA:  Acute encephalopathy.  Altered mental status.  EXAM: CT HEAD WITHOUT CONTRAST  TECHNIQUE: Contiguous axial images were obtained from the base of the skull through the vertex without intravenous contrast.  COMPARISON:  05/29/2006  FINDINGS: There is no intracranial hemorrhage, mass or evidence of acute infarction. There is mild generalized atrophy. There is mild chronic microvascular ischemic change. There is no significant extra-axial fluid collection.  No acute intracranial findings are evident. The visible paranasal sinuses are clear. Mild chronic sclerosis of the right mastoid air cells.  IMPRESSION: Mild generalized atrophy.  No acute intracranial findings.   Electronically Signed   By: Andreas Newport M.D.   On: 03/18/2015 23:26    I have reviewed the patient's current medications.  Assessment/Plan: 1 hyponatremia can use po diuretics and fluid restrict alone now. And follow.  Hypervolemic 2 TR/MR, pulm HTN 3 Hypothyroid 4 CHF P po lasix, fluid restrictl     LOS: 1 day   Shane Badeaux L 03/19/2015,10:52 AM

## 2015-03-19 NOTE — Progress Notes (Signed)
Utilization Review Completed.  

## 2015-03-19 NOTE — Evaluation (Signed)
Physical Therapy Evaluation Patient Details Name: [name] [name] MRN: [id number] DOB: 12/25/1929 Today's Date: 03/19/2015   History of Present Illness  [name] [name] is an 81 y.o. female with a history of chronic atrial fibrillation, severe MR and TR, not a candidate for surgical therapies because she is a Sales promotion account executive Witness and does not want to receive blood products precluding the use of Coumadin as well. She is presenting with a 2 week history of worsening dyspnea and 2 day history of hallucinations. She denies any NSAID use, fever, chills, productive cough or diarrhea. She has had generalized weakness, malaise, poor appetite over the past 2 weeks as well. She denies having chest pain, loss of consciousness but has had some dizziness as well. She denies any nausea or vomiting and reportedly doubled her intake of diuretics. Unfortunately she states that her weight actually increased by about 6 lbs over the past 3 days despite increasing the diuretics. She was found to have a serum sodium of 120 and was at her baseline creatinine in the 1.3 range. She was also noted to be hypothermic but no obvious site of infection.  Clinical Impression  Pt admitted with above diagnosis. Pt currently with functional limitations due to the deficits listed below (see PT Problem List). Pt ambulated with RW with significant right lateral lean.  Pt was surprised that she was so weak and that she had such a right lateral lean.  Pt needed incr assist up to mod assist for balance ambulation with RW.  Pt will need SNF for rehab to get stronger prior to going home as she cares for her sister.  Will follow acutely.   Pt will benefit from skilled PT to increase their independence and safety with mobility to allow discharge to the venue listed below.      Follow Up Recommendations SNF;Supervision/Assistance - 24 hour    Equipment Recommendations  Rolling walker with 5" wheels;3in1 (PT) (possibly shower equipment, TBA at NH)     Recommendations for Other Services       Precautions / Restrictions Precautions Precautions: Fall Restrictions Weight Bearing Restrictions: No      Mobility  Bed Mobility Overal bed mobility: Needs Assistance Bed Mobility: Supine to Sit     Supine to sit: Min assist     General bed mobility comments: Pt needed assist for LEs and for elevation of trunk.  Transfers Overall transfer level: Needs assistance Equipment used: Rolling walker (2 wheeled) Transfers: Sit to/from Stand Sit to Stand: Mod assist         General transfer comment: Pt needed assist to power up and assist once in standing for balance as she has right lateral lean.   Ambulation/Gait Ambulation/Gait assistance: Min assist;Mod assist Ambulation Distance (Feet): 115 Feet Assistive device: Rolling walker (2 wheeled) Gait Pattern/deviations: Step-through pattern;Decreased stride length;Decreased step length - right;Decreased step length - left;Shuffle;Antalgic;Trunk flexed;Staggering left;Staggering right   Gait velocity interpretation: Below normal speed for age/gender General Gait Details: Pt with heavy right lateral lean at times, needing up to mod assist.  Pt generally unsteady needing mod assist for stability while ambulating. Cues needed as well throughout.   Also had to assist pt to steer RW.    Stairs            Wheelchair Mobility    Modified Rankin (Stroke Patients Only)       Balance Overall balance assessment: Needs assistance;History of Falls Sitting-balance support: Feet supported;Bilateral upper extremity supported Sitting balance-Leahy Scale: Fair   Postural control:  Posterior lean;Right lateral lean Standing balance support: Bilateral upper extremity supported;During functional activity Standing balance-Leahy Scale: Poor Standing balance comment: Requires UE support of both UEs for balance.  Mod assist and cannot maintain static stance without UE support.                                 Pertinent Vitals/Pain Pain Assessment: No/denies pain  HR 69-86bpm, 90-94% O2 on RA with ambulation.  Nursing asked PT to leave O2 off and they will monitor.  BP supine 123/67, sitting 118/69 and standing 129/72.      Home Living Family/patient expects to be discharged to:: Private residence Living Arrangements: Other relatives (sister with dementia) Available Help at Discharge: Family;Available PRN/intermittently   Home Access: Stairs to enter Entrance Stairs-Rails: None Entrance Stairs-Number of Steps: 1-2 STE Home Layout: Two level;Bed/bath upstairs;1/2 bath on main level Home Equipment: Cane - single point Additional Comments: States she provides meals for her sister.  States that her family is the [place] witnesses and they are caring for her sister currently while she is in hospital      Prior Function Level of Independence: Independent with assistive device(s)         Comments: Pt states she used cane intermittently.  Pt reports that she gets SOB and often uses scooter at stores when shopping     Hand Dominance   Dominant Hand: Right    Extremity/Trunk Assessment   Upper Extremity Assessment: Defer to OT evaluation           Lower Extremity Assessment: Generalized weakness      Cervical / Trunk Assessment: Other exceptions (pt with heavy right lateral lean at times. )  Communication   Communication: No difficulties  Cognition Arousal/Alertness: Awake/alert Behavior During Therapy: WFL for tasks assessed/performed Overall Cognitive Status: Impaired/Different from baseline Area of Impairment: Orientation;Memory;Following commands;Safety/judgement;Awareness;Problem solving Orientation Level: Disoriented to;Place;Time;Situation   Memory: Decreased short-term memory Following Commands: Follows one step commands with increased time Safety/Judgement: Decreased awareness of safety;Decreased awareness of deficits Awareness:  Intellectual Problem Solving: Difficulty sequencing;Requires verbal cues;Requires tactile cues General Comments: pt was unaware that she had a foley.      General Comments General comments (skin integrity, edema, etc.): Bil LE below knees edematous and red.      Exercises General Exercises - Lower Extremity Ankle Circles/Pumps: AROM;Both;5 reps;Supine Long Arc Quad: AROM;Both;5 reps;Seated Hip Flexion/Marching: AROM;Both;10 reps;Seated      Assessment/Plan    PT Assessment Patient needs continued PT services  PT Diagnosis Difficulty walking;Generalized weakness   PT Problem List Decreased activity tolerance;Decreased balance;Decreased mobility;Decreased knowledge of use of DME;Decreased safety awareness;Decreased knowledge of precautions  PT Treatment Interventions DME instruction;Gait training;Functional mobility training;Therapeutic activities;Stair training;Therapeutic exercise;Balance training;Patient/family education   PT Goals (Current goals can be found in the Care Plan section) Acute Rehab PT Goals Patient Stated Goal: to get better PT Goal Formulation: With patient Time For Goal Achievement: 04/02/15 Potential to Achieve Goals: Good    Frequency Min 3X/week   Barriers to discharge Decreased caregiver support pt cares for her sister with dementia    Co-evaluation               End of Session Equipment Utilized During Treatment: Gait belt;Oxygen Activity Tolerance: Patient limited by fatigue Patient left: in chair;with call bell/phone within reach;with chair alarm set;with nursing/sitter in room (NT came in to change bed and give pt bath) Nurse Communication: Mobility status  Time: 4888-9169 PT Time Calculation (min) (ACUTE ONLY): 28 min   Charges:   PT Evaluation $Initial PT Evaluation Tier I: 1 Procedure PT Treatments $Gait Training: 8-22 mins   PT G CodesDenice Paradise 10-Apr-2015, 9:14 AM Tyjai Matuszak,PT Acute  Rehabilitation [phone] (pager)

## 2015-03-19 NOTE — Progress Notes (Addendum)
2:30pm Pt HCPOA Cornell asked to speak with CSW in patient room- CSW met with pt and Cornell alongside his wife Vaughan Basta.  Cornell had spoken further with pt and is agreeable to taking care of pt sister while the pt is in rehab- pt is now agreeable to going to rehab facility- CSW will initiate bed search  12:40pm Pt is refusing placement- would like to go home with home health- RNCM informed of pt decision  CSW provided resources for ALF for pt sister who has dementia and lives with the pt  No further CSW needs identified  CSW signing off  Domenica Reamer, [place] Worker [phone]

## 2015-03-19 NOTE — Progress Notes (Signed)
Patient Name: [name] [name] Date of Encounter: 03/19/2015  Principal Problem:   Hyponatremia Active Problems:   Hypothyroidism   Essential hypertension   Atrial fibrillation (HCC)   GERD   Tricuspid regurgitation   Pulmonary hypertension (HCC)   Acute on chronic diastolic CHF (congestive heart failure) (HCC)   Chronic a-fib (HCC)   Mitral regurgitation, moderate to severe   Chronic kidney disease, stage III (moderate)   SOB (shortness of breath)   Hypothermia   Encephalopathy, metabolic   Primary Cardiologist: Dr Mare Ferrari  Patient Profile: 81 yo female w/ hx chronic afib, HTN, pulm HTN, Jehovah's Witness, EF 55%, w/ severe TR/MR, mild-mod AI, PAS 64 2015. Admitted 10/05 w/ R heart failure, Na+ 122, started on Tolvaptan  SUBJECTIVE: Breathing better, feels better in general, believes her dry weight is 105 lbs.  OBJECTIVE Filed Vitals:   03/19/15 0000 03/19/15 0400 03/19/15 0500 03/19/15 0744  BP: 129/67 119/63  123/67  Pulse: 63 68  76  Temp: 97.3 F (36.3 C) 97.9 F (36.6 C)  98.1 F (36.7 C)  TempSrc: Axillary Axillary  Axillary  Resp: 23 12  18   Height:      Weight:   115 lb 11.9 oz (52.5 kg)   SpO2: 90% 100%  99%    Intake/Output Summary (Last 24 hours) at 03/19/15 0906 Last data filed at 03/18/15 2115  Gross per 24 hour  Intake      0 ml  Output    820 ml  Net   -820 ml   Filed Weights   03/18/15 1323 03/19/15 0500  Weight: 111 lb (50.349 kg) 115 lb 11.9 oz (52.5 kg)    PHYSICAL EXAM General: Well developed, well nourished, female in no acute distress. Head: Normocephalic, atraumatic.  Neck: Supple without bruits, JVD to jaw. Lungs:  Resp regular and unlabored, bibasilar rales. Heart: Irreg irreg, S1, S2, no S3, S4, 3/6 SEM, soft DM; no rub. Abdomen: Soft, non-tender, non-distended, BS + x 4.  Extremities: No clubbing, cyanosis, 2-3+ edema.  Neuro: Alert and oriented X 3. Moves all extremities spontaneously. Psych: Normal  affect.  LABS: CBC: Recent Labs  03/18/15 1332 03/19/15 0320  WBC 5.7 6.4  HGB 12.2 12.0  HCT 37.1 36.4  MCV 81.9 82.5  PLT 163 650   Basic Metabolic Panel: Recent Labs  03/18/15 2342 03/19/15 0320  NA 122* 123*  K 3.6 3.9  CL 77* 79*  CO2 34* 33*  GLUCOSE 137* 79  BUN 33* 34*  CREATININE 1.46* 1.40*  CALCIUM 9.1 8.8*  MG 1.8  --    Liver Function Tests: Recent Labs  03/19/15 0320  AST 51*  ALT 21  ALKPHOS 81  BILITOT 1.1  PROT 5.7*  ALBUMIN 3.3*   Cardiac Enzymes: Recent Labs  03/18/15 1935 03/18/15 2342 03/19/15 0545  TROPONINI 0.08* 0.08* 0.10*    Recent Labs  03/18/15 1604  TROPIPOC 0.07   BNP:  B NATRIURETIC PEPTIDE  Date/Time Value Ref Range Status  03/19/2015 05:45 AM 1487.0* 0.0 - 100.0 pg/mL Final  03/18/2015 03:57 PM 1123.0* 0.0 - 100.0 pg/mL Corrected    Comment:    CALLED TO C KING,RN 1726 03/18/15 D BRADLEY CORRECTED ON 10/05 AT 1725: PREVIOUSLY REPORTED AS A    Thyroid Function Tests: Recent Labs  03/18/15 1935  TSH 2.889   TELE:  Atrial fib, occ PVCs      Radiology/Studies: Dg Chest 2 View  03/18/2015   CLINICAL DATA:  Shortness of breath  several days, weakness, history of CHF, valvular heart disease, pulmonary hypertension, previous history of tobacco use.  EXAM: CHEST  2 VIEW  COMPARISON:  PA and lateral chest x-ray of June 07, 2014  FINDINGS: The lungs remain hyperinflated with hemidiaphragm flattening and increased AP dimension of the thorax. There is minimal density projecting over the right lateral costophrenic gutter. The cardiac silhouette remains enlarged. The pulmonary vascularity is not engorged. There is no significant pleural effusion. There is prominent thoracic kyphosis and there is mild levocurvature of the thoracolumbar junction.  IMPRESSION: COPD and cardiomegaly. There is no pulmonary edema or alveolar pneumonia. Subsegmental atelectasis is present laterally overlying right cardiophrenic angle.    Electronically Signed   By: David  Martinique M.D.   On: 03/18/2015 14:37   Ct Head Wo Contrast  03/18/2015   CLINICAL DATA:  Acute encephalopathy.  Altered mental status.  EXAM: CT HEAD WITHOUT CONTRAST  TECHNIQUE: Contiguous axial images were obtained from the base of the skull through the vertex without intravenous contrast.  COMPARISON:  05/29/2006  FINDINGS: There is no intracranial hemorrhage, mass or evidence of acute infarction. There is mild generalized atrophy. There is mild chronic microvascular ischemic change. There is no significant extra-axial fluid collection.  No acute intracranial findings are evident. The visible paranasal sinuses are clear. Mild chronic sclerosis of the right mastoid air cells.  IMPRESSION: Mild generalized atrophy.  No acute intracranial findings.   Electronically Signed   By: Andreas Newport M.D.   On: 03/18/2015 23:26     Current Medications:  . [START ON 03/20/2015] aspirin EC  81 mg Oral Q M,W,F  . diltiazem  120 mg Oral Daily  . enoxaparin (LOVENOX) injection  30 mg Subcutaneous Q24H  . furosemide  40 mg Intravenous Q12H  . levothyroxine  50 mcg Oral Daily  . metoprolol succinate  37.5 mg Oral Daily  . senna-docusate  1 tablet Oral QHS  . sodium chloride  3 mL Intravenous Q12H  . sodium chloride  3 mL Intravenous Q12H  . sodium chloride  3 mL Intravenous Q12H  . tolvaptan  15 mg Oral Q24H      ASSESSMENT AND PLAN: Principal Problem:   Hyponatremia - on Tolvaptan - since BMET ordered q 6 h, d/c'd q 8 h Na check - otherwise, per IM - agree w/ fluid restriction    Acute on chronic diastolic CHF (congestive heart failure) (HCC) - per pt, dry wt is 105 - diuresed 820 cc overnight - cont to follow I/O, standing weights as this am was bed weight (do not believe she gained 4 lbs overnight) - add TED hose     Atrial fibrillation (HCC) - rate very stable on Cardizem CD 120 - if this could be contributing to edema, should we try BB? Per  MD  Otherwise, per IM Active Problems:   Hypothyroidism   Essential hypertension   GERD   Tricuspid regurgitation   Pulmonary hypertension (HCC)   Chronic a-fib (HCC)   Mitral regurgitation, moderate to severe   Chronic kidney disease, stage III (moderate)   SOB (shortness of breath)   Hypothermia   Encephalopathy, metabolic   Signed, Barrett, Rhonda , PA-C 9:06 AM 03/19/2015  I have seen and examined the patient along with Barrett, Suanne Marker , PA-C.  I have reviewed the chart, notes and new data.  I agree with PA's note.  Key new complaints: still feels a little confused Key examination changes: massive leg edema, hyperpulsatile and distended jugular veins, clear  lungs, irregular rhythm, 3/6 holosystolic murmurs at apex and LLSB, hyperdynamic precordium Key new findings / data: Na slightly better at 123  PLAN: Continue diuresis and tolvaptan.  Sanda Klein, MD, Reader [phone] 03/19/2015, 1:56 PM

## 2015-03-19 NOTE — Progress Notes (Signed)
  Echocardiogram 2D Echocardiogram has been performed.  [name] [name] 03/19/2015, 4:44 PM

## 2015-03-19 NOTE — Clinical Social Work Note (Signed)
Clinical Social Work Assessment  Patient Details  Name: [name] [name] MRN: [id number] Date of Birth: [birth year]  Date of referral:  03/19/15               Reason for consult:  Facility Placement                Permission sought to share information with:    Permission granted to share information::  No  Name::        Agency::     Relationship::     Contact Information:     Housing/Transportation Living arrangements for the past 2 months:  Apartment (townhouse) Source of Information:  Patient Patient Interpreter Needed:  None Criminal Activity/Legal Involvement Pertinent to Current Situation/Hospitalization:  No - Comment as needed Significant Relationships:  Church, Friend, Siblings Lives with:  Siblings Do you feel safe going back to the place where you live?  Yes Need for family participation in patient care:  No (Coment) (pt is primary caregiver for sister)  Care giving concerns:  Pt lives at home with her sister who has dementia- pt is the primary caregiver for sister- pt does not think she can continue to care for her sister given her level of needs   Facilities manager / plan:  CSW spoke with pt about PT recommendation for SNF and discussed resources for pt sister  Employment status:  Retired Nurse, adult PT Recommendations:  [place] / Referral to community resources:  [place]  Patient/Family's Response to care:  Pt is not agreeable to SNF placement- believes she can get along ok at home alone and refuses to let her sister continue being a "burden" to her friends who are currently letting the pt sister live with them.  Pt is interested in resources for placing her sister- CSW provided pt with a list of ALF and ALF memory care facilities in the area and informed her she would need to find a facility and get help from her sisters PCP in providing appropriate paperwork.    Patient/Family's  Understanding of and Emotional Response to Diagnosis, Current Treatment, and Prognosis:  Pt does not have any questions or concerns regarding medical care  Emotional Assessment Appearance:  Appears stated age Attitude/Demeanor/Rapport:    Affect (typically observed):  Appropriate, Frustrated Orientation:  Oriented to Self, Oriented to Place, Oriented to  Time, Oriented to Situation Alcohol / Substance use:  Not Applicable Psych involvement (Current and /or in the community):  No (Comment)  Discharge Needs  Concerns to be addressed:  Patient refuses services, Home Safety Concerns, Coping/Stress Concerns Readmission within the last 30 days:  No Current discharge risk:  Physical Impairment, Lack of support system Barriers to Discharge:  Continued Medical Work up   [employer], LCSW 03/19/2015, 12:38 PM

## 2015-03-19 NOTE — Clinical Social Work Placement (Signed)
   CLINICAL SOCIAL WORK PLACEMENT  NOTE  Date:  03/19/2015  Patient Details  Name: [name] [name] MRN: [id number] Date of Birth: 12/03/1929  Clinical Social Work is seeking post-discharge placement for this patient at the [place] level of care (*CSW will initial, date and re-position this form in  chart as items are completed):  Yes   Patient/family provided with [facility] Work Department's list of facilities offering this level of care within the geographic area requested by the patient (or if unable, by the patient's family).  Yes   Patient/family informed of their freedom to choose among providers that offer the needed level of care, that participate in Medicare, Medicaid or managed care program needed by the patient, have an available bed and are willing to accept the patient.  Yes   Patient/family informed of [facility]'s ownership interest in [facility] and [facility], as well as of the fact that they are under no obligation to receive care at these facilities.  PASRR submitted to EDS on       PASRR number received on       Existing PASRR number confirmed on       FL2 transmitted to all facilities in geographic area requested by pt/family on       FL2 transmitted to all facilities within larger geographic area on       Patient informed that his/her managed care company has contracts with or will negotiate with certain facilities, including the following:            Patient/family informed of bed offers received.  Patient chooses bed at       Physician recommends and patient chooses bed at      Patient to be transferred to   on  .  Patient to be transferred to facility by       Patient family notified on   of transfer.  Name of family member notified:        PHYSICIAN Please sign FL2     Additional Comment:    _______________________________________________ Cranford Mon, LCSW 03/19/2015, 3:44 PM

## 2015-03-20 DIAGNOSIS — K219 Gastro-esophageal reflux disease without esophagitis: Secondary | ICD-10-CM

## 2015-03-20 LAB — BASIC METABOLIC PANEL
ANION GAP: 11 (ref 5–15)
BUN: 46 mg/dL — ABNORMAL HIGH (ref 6–20)
CALCIUM: 8.8 mg/dL — AB (ref 8.9–10.3)
CO2: 35 mmol/L — AB (ref 22–32)
Chloride: 85 mmol/L — ABNORMAL LOW (ref 101–111)
Creatinine, Ser: 1.71 mg/dL — ABNORMAL HIGH (ref 0.44–1.00)
GFR calc non Af Amer: 26 mL/min — ABNORMAL LOW (ref >60)
GFR, EST AFRICAN AMERICAN: 31 mL/min — AB (ref >60)
Glucose, Bld: 110 mg/dL — ABNORMAL HIGH (ref 65–99)
POTASSIUM: 3.3 mmol/L — AB (ref 3.5–5.1)
Sodium: 131 mmol/L — ABNORMAL LOW (ref 135–145)

## 2015-03-20 LAB — CBC
HCT: 36.6 % (ref 36.0–46.0)
HEMOGLOBIN: 11.9 g/dL — AB (ref 12.0–15.0)
MCH: 27.3 pg (ref 26.0–34.0)
MCHC: 32.5 g/dL (ref 30.0–36.0)
MCV: 83.9 fL (ref 78.0–100.0)
Platelets: 157 10*3/uL (ref 150–400)
RBC: 4.36 MIL/uL (ref 3.87–5.11)
RDW: 16 % — ABNORMAL HIGH (ref 11.5–15.5)
WBC: 6.8 10*3/uL (ref 4.0–10.5)

## 2015-03-20 LAB — URINE CULTURE: CULTURE: NO GROWTH

## 2015-03-20 LAB — URIC ACID, RANDOM URINE: Uric Acid, Urine: 17 mg/dL

## 2015-03-20 LAB — MAGNESIUM: MAGNESIUM: 1.7 mg/dL (ref 1.7–2.4)

## 2015-03-20 MED ORDER — MAGNESIUM SULFATE 2 GM/50ML IV SOLN
2.0000 g | Freq: Once | INTRAVENOUS | Status: AC
  Administered 2015-03-20: 2 g via INTRAVENOUS
  Filled 2015-03-20: qty 50

## 2015-03-20 MED ORDER — FUROSEMIDE 40 MG PO TABS
40.0000 mg | ORAL_TABLET | Freq: Every day | ORAL | Status: DC
  Administered 2015-03-20 – 2015-03-21 (×2): 40 mg via ORAL
  Filled 2015-03-20 (×2): qty 1

## 2015-03-20 MED ORDER — POTASSIUM CHLORIDE CRYS ER 20 MEQ PO TBCR
40.0000 meq | EXTENDED_RELEASE_TABLET | ORAL | Status: AC
  Administered 2015-03-20 (×2): 40 meq via ORAL
  Filled 2015-03-20 (×2): qty 2

## 2015-03-20 NOTE — Progress Notes (Signed)
Occupational Therapy Evaluation Patient Details Name: [name] [name] MRN: [id number] DOB: March 29, 1930 Today's Date: 03/20/2015    History of Present Illness [name] [name] is an 81 y.o. female with a history of chronic atrial fibrillation, severe MR and TR, not a candidate for surgical therapies because she is a Sales promotion account executive Witness and does not want to receive blood products precluding the use of Coumadin as well.  2 week history of worsening dyspnea and 2 day history of hallucinations.generalized weakness, malaise, poor appetite over the past 2 weeks as well. She denies having chest pain, loss of consciousness but has had some dizziness as well.  weight increased by about 6 lbs over the past 3 days despite increasing the diuretics.  serum sodium of 120 and was at her baseline creatinine in the 1.3 range. She was also noted to be hypothermic but no obvious site of infection.   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 Pt making progress, however, feel pt will benefit from rehab at SNF to facilitate safe return to home. Will follow acutely to facilitate d/c to next venue of care.      Follow Up Recommendations  SNF;Supervision/Assistance - 24 hour    Equipment Recommendations  3 in 1 bedside comode;Tub/shower bench    Recommendations for Other Services       Precautions / Restrictions Precautions Precautions: Fall Restrictions Weight Bearing Restrictions: No      Mobility Bed Mobility Overal bed mobility: Needs Assistance Bed Mobility: Supine to Sit     Supine to sit: Supervision     General bed mobility comments: able to get EOB with HOB elevated  Transfers Overall transfer level: Needs assistance Equipment used: Rolling walker (2 wheeled)   Sit to Stand: Min guard         General transfer comment: improved ability compared to yesterdayPT eveal    Balance     Sitting balance-Leahy Scale: Good       Standing  balance-Leahy Scale: Fair                              ADL Overall ADL's : Needs assistance/impaired Eating/Feeding: Independent   Grooming: Set up   Upper Body Bathing: Set up;Supervision/ safety;Sitting   Lower Body Bathing: Minimal assistance;Sit to/from stand   Upper Body Dressing : Set up;Supervision/safety;Sitting   Lower Body Dressing: Minimal assistance;Sit to/from stand   Toilet Transfer: Minimal assistance;RW;BSC;Ambulation   Toileting- Water quality scientist and Hygiene: Min guard;Sit to/from stand       Functional mobility during ADLs: Minimal assistance;Rolling walker;Cueing for safety General ADL Comments: Decline in function frombaseline     Vision     Perception     Praxis      Pertinent Vitals/Pain  no c/o pain     Hand Dominance Right   Extremity/Trunk Assessment Upper Extremity Assessment Upper Extremity Assessment: Generalized weakness   Lower Extremity Assessment Lower Extremity Assessment: Generalized weakness   Cervical / Trunk Assessment Cervical / Trunk Assessment: Other exceptions (?scoliosis)   Communication Communication Communication: No difficulties   Cognition Arousal/Alertness: Awake/alert Behavior During Therapy: WFL for tasks assessed/performed Overall Cognitive Status: No family/caregiver present to determine baseline cognitive functioning                 General Comments: hallucinations and confusion on admission. Appers improving. Pt appropriate with questions. Decreaed safety awareness   General Comments  Exercises       Shoulder Instructions      Home Living Family/patient expects to be discharged to:: Private residence Living Arrangements: Other relatives (sister with dementia) Available Help at Discharge: Family;Available PRN/intermittently   Home Access: Stairs to enter Entrance Stairs-Number of Steps: 1-2 STE Entrance Stairs-Rails: None Home Layout: Two level;Bed/bath  upstairs;1/2 bath on main level Alternate Level Stairs-Number of Steps: 1 flight of stairs Alternate Level Stairs-Rails: Right Bathroom Shower/Tub: Teacher, early years/pre: Standard Bathroom Accessibility: Yes   Home Equipment: Cane - single point   Additional Comments: States she provides meals for her sister.  States that her family is the Yoe witnesses and they are caring for her sister currently while she is in hospital        Prior Functioning/Environment Level of Independence: Independent with assistive device(s)        Comments: Pt states she used cane intermittently.  Pt reports that she gets SOB and often uses scooter at stores when shopping    OT Diagnosis: Generalized weakness   OT Problem List: Decreased strength;Decreased activity tolerance;Impaired balance (sitting and/or standing);Decreased safety awareness;Decreased knowledge of use of DME or AE   OT Treatment/Interventions: Self-care/ADL training;Therapeutic exercise;Energy conservation;Therapeutic activities;Patient/family education;Cognitive remediation/compensation    OT Goals(Current goals can be found in the care plan section) Acute Rehab OT Goals Patient Stated Goal: to get better OT Goal Formulation: With patient Time For Goal Achievement: 03/27/15 Potential to Achieve Goals: Good ADL Goals Pt Will Perform Lower Body Bathing: with supervision;sit to/from stand Pt Will Perform Lower Body Dressing: with supervision;sit to/from stand Pt Will Transfer to Toilet: with supervision;ambulating (RW level) Pt Will Perform Toileting - Clothing Manipulation and hygiene: with supervision;sit to/from stand Additional ADL Goal #1: Demonstrte anticipatory awareness during ADl with minimal vc  OT Frequency: Min 2X/week   Barriers to D/C: Decreased caregiver support          Co-evaluation              End of Session Equipment Utilized During Treatment: Gait belt;Rolling walker Nurse  Communication: Mobility status  Activity Tolerance: Patient tolerated treatment well Patient left: in chair;with call bell/phone within reach   Time: 1320-1340 OT Time Calculation (min): 20 min Charges:  OT General Charges $OT Visit: 1 Procedure OT Evaluation $Initial OT Evaluation Tier I: 1 Procedure G-Codes:    Shalayna Ornstein,HILLARY 04-08-2015, 1:47 PM   [facility], OTR/L  [phone] 04-08-15

## 2015-03-20 NOTE — Progress Notes (Signed)
[facility] PROGRESS NOTE  [name] [name] IRJ:[id number] DOB: November 26, 1929 DOA: 03/18/2015 PCP: Lilian Coma, MD  Assessment/Plan: #1 severe acute on chronic hyponatremia Likely secondary to hypervolemic hyponatremia. Serum osmolality was low at 266. Urine osmolality was low at 262. Specific gravity was less than 1.005. Urine sodium was 56. Urine creatinine 19.95. TSH within normal limits at 2.889. Random cortisol level was 20.8. CT head negative for any acute abnormalities. Chest x-ray negative for any acute abnormalities. Serum sodium now at 131 from 120 on admission. Patient has been seen in consultation by nephrology and patient has been started on tolvaptan. Tall VAC time has been discontinued. Patient with good diuresis. Patient is -3.29 L during this hospitalization. Diuretics have been changed to oral Lasix at 40 mg daily per nephrology. Repeat BMET in the morning. Continue fluid restriction. Nephrology following and appreciate input and recommendations.  #2 acute on chronic right-sided diastolic heart failure Questionable etiology. Troponins slightly elevated however likely secondary to CHF exacerbation. 2-D echo with EF of 50-55%. Mild to moderate aortic valvular regurgitation. Severe mitral valvular regurgitation, severe tricuspid valvular regurgitation. Mildly reduced right ventricular systolic function. Massively dilated right atrium. Continue strict I's and O. Daily weights. BNP elevated at 1487 yesterday from 1123 on admission. Patient is - 3.342 L over the past 24 hours. Continue aspirin, Cardizem, metoprolol. IV Lasix has been transitioned to oral Lasix and dose reduced to 40 mg daily per nephrology. Cardiology following and appreciate input and recommendations.  #3 acute metabolic encephalopathy Likely secondary to problem #1. Patient alert to self and place. Patient more alert today, sitting up and eating breakfast. Patient denies any further hallucinations. CT head is  negative. Patient has been pancultured cultures are pending. No signs or symptoms of infection. TSH within normal limits. Continue treatment as in problem #1.  #4 hypothermia Unknown etiology. Patient is currently normothermic. Urinalysis is negative for UTI. Chest x-ray negative for any acute infiltrates. CT head is negative. Blood cultures are pending. Follow. Supportive care.  #5 chronic atrial fibrillation CHADS2VASC score 5. Continue Cardizem and metoprolol for rate control. Patient has refused anticoagulation in the past secondary to being a Jehovah's Witness. Aspirin.  #6 multi-valvular disease(MR/TR/Pulm HTN) Patient not considered an operative candidate secondary to frailty and refusal of blood products due to been a Jehovah's Witness. 2-D echo with EF of 50-55%. Mild to moderate aortic valvular regurgitation. Severe mitral valvular regurgitation, severe tricuspid valvular regurgitation. Mildly reduced right ventricular systolic function. Massively dilated right atrium. Continue current medical management with aspirin, Cardizem, metoprolol, Lasix. Cardiology following.  #7 chronic kidney disease stage III Stable.  #8 hypothyroidism TSH within normal limits. Continue home dose Synthroid.  #9 hypertension Continue Cardizem and metoprolol.  #10 prophylaxis Lovenox for DVT prophylaxis.  Code Status: Full Family Communication: Updated patient. No family at bedside. Disposition Plan: Transfer to telemetry. Likely needs SNF on discharge.   Consultants:  Cardiology: Dr. Debara Pickett 03/18/2015  Nephrology: Dr. Augustin Coupe 03/18/2015  Procedures:  CT head 03/18/2015  Chest x-ray 03/18/2015  2-D echo 03/19/2015  Antibiotics:  None  HPI/Subjective: Patient alert, eating breakfast.  Patient states shortness of breath is improved from admission. Patient denies any chest pain. Patient denies any hallucinations.Feeling much better.  Objective: Filed Vitals:   03/20/15 0400  BP: 107/63   Pulse: 59  Temp: 98.3 F (36.8 C)  Resp: 15    Intake/Output Summary (Last 24 hours) at 03/20/15 0857 Last data filed at 03/20/15 0600  Gross per 24 hour  Intake  603 ml  Output   3125 ml  Net  -2522 ml   Filed Weights   03/19/15 0500 03/19/15 1019 03/20/15 0500  Weight: 52.5 kg (115 lb 11.9 oz) 48.762 kg (107 lb 8 oz) 47.718 kg (105 lb 3.2 oz)    Exam:   General:  Alert  Cardiovascular: Irregularly irregular. 3/6 SEM murmur. + JVD  Respiratory: Clear to auscultation bilaterally  Abdomen: Soft, nontender, nondistended, positive bowel sounds  Musculoskeletal: No clubbing or cyanosis. 2+ bilateral lower extremity edema.  Data Reviewed: Basic Metabolic Panel:  Recent Labs Lab 03/18/15 2342 03/19/15 0320 03/19/15 0920 03/19/15 1427 03/19/15 2030 03/20/15 0322  NA 122* 123* 126* 129* 127* 131*  K 3.6 3.9 4.0 3.4* 3.4* 3.3*  CL 77* 79* 83* 84* 82* 85*  CO2 34* 33* 32 34* 35* 35*  GLUCOSE 137* 79 101* 86 117* 110*  BUN 33* 34* 38* 39* 46* 46*  CREATININE 1.46* 1.40* 1.53* 1.49* 1.78* 1.71*  CALCIUM 9.1 8.8* 9.0 9.2 8.7* 8.8*  MG 1.8  --   --   --   --  1.7  PHOS  --   --   --  3.1  --   --    Liver Function Tests:  Recent Labs Lab 03/19/15 0320 03/19/15 1427  AST 51* 44*  ALT 21 22  ALKPHOS 81 83  BILITOT 1.1 1.2  PROT 5.7* 6.0*  ALBUMIN 3.3* 3.4*   No results for input(s): LIPASE, AMYLASE in the last 168 hours. No results for input(s): AMMONIA in the last 168 hours. CBC:  Recent Labs Lab 03/18/15 1332 03/19/15 0320 03/20/15 0322  WBC 5.7 6.4 6.8  HGB 12.2 12.0 11.9*  HCT 37.1 36.4 36.6  MCV 81.9 82.5 83.9  PLT 163 167 157   Cardiac Enzymes:  Recent Labs Lab 03/18/15 1935 03/18/15 2342 03/19/15 0545  TROPONINI 0.08* 0.08* 0.10*   BNP (last 3 results)  Recent Labs  06/14/14 0419 03/18/15 1557 03/19/15 0545  BNP 428.8* 1123.0* 1487.0*    ProBNP (last 3 results) No results for input(s): PROBNP in the last 8760  hours.  CBG: No results for input(s): GLUCAP in the last 168 hours.  Recent Results (from the past 240 hour(s))  MRSA PCR Screening     Status: None   Collection Time: 03/18/15  7:26 PM  Result Value Ref Range Status   MRSA by PCR NEGATIVE NEGATIVE Final    Comment:        The GeneXpert MRSA Assay (FDA approved for NASAL specimens only), is one component of a comprehensive MRSA colonization surveillance program. It is not intended to diagnose MRSA infection nor to guide or monitor treatment for MRSA infections.   Blood culture (routine x 2)     Status: None (Preliminary result)   Collection Time: 03/18/15  7:35 PM  Result Value Ref Range Status   Specimen Description BLOOD LEFT ARM  Final   Special Requests BOTTLES DRAWN AEROBIC AND ANAEROBIC 10CC  Final   Culture NO GROWTH < 24 HOURS  Final   Report Status PENDING  Incomplete  Blood culture (routine x 2)     Status: None (Preliminary result)   Collection Time: 03/18/15  7:35 PM  Result Value Ref Range Status   Specimen Description BLOOD LEFT WRIST  Final   Special Requests IN PEDIATRIC BOTTLE 2CC  Final   Culture NO GROWTH < 24 HOURS  Final   Report Status PENDING  Incomplete     Studies: Dg Chest  2 View  03/18/2015   CLINICAL DATA:  Shortness of breath several days, weakness, history of CHF, valvular heart disease, pulmonary hypertension, previous history of tobacco use.  EXAM: CHEST  2 VIEW  COMPARISON:  PA and lateral chest x-ray of June 07, 2014  FINDINGS: The lungs remain hyperinflated with hemidiaphragm flattening and increased AP dimension of the thorax. There is minimal density projecting over the right lateral costophrenic gutter. The cardiac silhouette remains enlarged. The pulmonary vascularity is not engorged. There is no significant pleural effusion. There is prominent thoracic kyphosis and there is mild levocurvature of the thoracolumbar junction.  IMPRESSION: COPD and cardiomegaly. There is no pulmonary  edema or alveolar pneumonia. Subsegmental atelectasis is present laterally overlying right cardiophrenic angle.   Electronically Signed   By: David  Martinique M.D.   On: 03/18/2015 14:37   Ct Head Wo Contrast  03/18/2015   CLINICAL DATA:  Acute encephalopathy.  Altered mental status.  EXAM: CT HEAD WITHOUT CONTRAST  TECHNIQUE: Contiguous axial images were obtained from the base of the skull through the vertex without intravenous contrast.  COMPARISON:  05/29/2006  FINDINGS: There is no intracranial hemorrhage, mass or evidence of acute infarction. There is mild generalized atrophy. There is mild chronic microvascular ischemic change. There is no significant extra-axial fluid collection.  No acute intracranial findings are evident. The visible paranasal sinuses are clear. Mild chronic sclerosis of the right mastoid air cells.  IMPRESSION: Mild generalized atrophy.  No acute intracranial findings.   Electronically Signed   By: Andreas Newport M.D.   On: 03/18/2015 23:26    Scheduled Meds: . aspirin EC  81 mg Oral Q M,W,F  . diltiazem  120 mg Oral Daily  . enoxaparin (LOVENOX) injection  30 mg Subcutaneous Q24H  . furosemide  40 mg Oral Daily  . levothyroxine  50 mcg Oral Daily  . magnesium sulfate 1 - 4 g bolus IVPB  2 g Intravenous Once  . metoprolol succinate  25 mg Oral QHS  . potassium chloride  40 mEq Oral Q4H  . senna-docusate  1 tablet Oral QHS  . sodium chloride  3 mL Intravenous Q12H   Continuous Infusions:   Principal Problem:   Hyponatremia Active Problems:   Acute on chronic diastolic CHF (congestive heart failure) (HCC)   Hypothermia   Encephalopathy, metabolic   Hypothyroidism   Essential hypertension   Atrial fibrillation (HCC)   GERD   Tricuspid regurgitation   Pulmonary hypertension (HCC)   Chronic a-fib (HCC)   Mitral regurgitation, moderate to severe   Chronic kidney disease, stage III (moderate)   SOB (shortness of breath)    Time spent: 82  minutes    Fannye Myer M.D. [facility] Pager [phone]. If 7PM-7AM, please contact night-coverage at [email or web address], password [facility] 03/20/2015, 8:57 AM  LOS: 2 days

## 2015-03-20 NOTE — Progress Notes (Signed)
Subjective: Interval History: has no complaint .  Objective: Vital signs in last 24 hours: Temp:  [96.9 F (36.1 C)-98.3 F (36.8 C)] 98.3 F (36.8 C) (10/07 0400) Pulse Rate:  [58-76] 59 (10/07 0400) Resp:  [15-25] 15 (10/07 0400) BP: (107-127)/(59-75) 107/63 mmHg (10/07 0400) SpO2:  [88 %-100 %] 94 % (10/07 0400) Weight:  [47.718 kg (105 lb 3.2 oz)-48.762 kg (107 lb 8 oz)] 47.718 kg (105 lb 3.2 oz) (10/07 0500) Weight change: -1.588 kg (-3 lb 8 oz)  Intake/Output from previous day: 10/06 0701 - 10/07 0700 In: 603 [P.O.:600; I.V.:3] Out: 3125 [Urine:3125] Intake/Output this shift:    General appearance: alert, cooperative and pale Resp: diminished breath sounds bilaterally and rales bibasilar Cardio: S1, S2 normal and systolic murmur: holosystolic 2/6, blowing at apex GI: pos bs soft, Extremities: edema 2-3+ edema  800cc on beside table despite fluid restriction  Lab Results:  Recent Labs  03/19/15 0320 03/20/15 0322  WBC 6.4 6.8  HGB 12.0 11.9*  HCT 36.4 36.6  PLT 167 157   BMET:  Recent Labs  03/19/15 2030 03/20/15 0322  NA 127* 131*  K 3.4* 3.3*  CL 82* 85*  CO2 35* 35*  GLUCOSE 117* 110*  BUN 46* 46*  CREATININE 1.78* 1.71*  CALCIUM 8.7* 8.8*   No results for input(s): PTH in the last 72 hours. Iron Studies: No results for input(s): IRON, TIBC, TRANSFERRIN, FERRITIN in the last 72 hours.  Studies/Results: Dg Chest 2 View  03/18/2015   CLINICAL DATA:  Shortness of breath several days, weakness, history of CHF, valvular heart disease, pulmonary hypertension, previous history of tobacco use.  EXAM: CHEST  2 VIEW  COMPARISON:  PA and lateral chest x-ray of June 07, 2014  FINDINGS: The lungs remain hyperinflated with hemidiaphragm flattening and increased AP dimension of the thorax. There is minimal density projecting over the right lateral costophrenic gutter. The cardiac silhouette remains enlarged. The pulmonary vascularity is not engorged. There is  no significant pleural effusion. There is prominent thoracic kyphosis and there is mild levocurvature of the thoracolumbar junction.  IMPRESSION: COPD and cardiomegaly. There is no pulmonary edema or alveolar pneumonia. Subsegmental atelectasis is present laterally overlying right cardiophrenic angle.   Electronically Signed   By: David  Martinique M.D.   On: 03/18/2015 14:37   Ct Head Wo Contrast  03/18/2015   CLINICAL DATA:  Acute encephalopathy.  Altered mental status.  EXAM: CT HEAD WITHOUT CONTRAST  TECHNIQUE: Contiguous axial images were obtained from the base of the skull through the vertex without intravenous contrast.  COMPARISON:  05/29/2006  FINDINGS: There is no intracranial hemorrhage, mass or evidence of acute infarction. There is mild generalized atrophy. There is mild chronic microvascular ischemic change. There is no significant extra-axial fluid collection.  No acute intracranial findings are evident. The visible paranasal sinuses are clear. Mild chronic sclerosis of the right mastoid air cells.  IMPRESSION: Mild generalized atrophy.  No acute intracranial findings.   Electronically Signed   By: Andreas Newport M.D.   On: 03/18/2015 23:26    I have reviewed the patient's current medications.  Assessment/Plan: 1 hypervolemic hyponatremia improving with Lasix alone and ? Fluid restriction  can lower Lasix dose.   2 MR/TR/Pulm HTN 3 afib 4 GERD 5 Hypothyroid P lower Lasix, encourage fluid restriction, d/c foley.  Will s/o for now and see again at your request    LOS: 2 days   Moustafa Mossa L 03/20/2015,7:38 AM

## 2015-03-20 NOTE — Progress Notes (Signed)
Patient Name: [name] [name] Date of Encounter: 03/20/2015  Primary Cardiologist: Dr Mare Ferrari   Principal Problem:   Hyponatremia Active Problems:   Hypothyroidism   Essential hypertension   Atrial fibrillation (HCC)   GERD   Tricuspid regurgitation   Pulmonary hypertension (HCC)   Acute on chronic diastolic CHF (congestive heart failure) (HCC)   Chronic a-fib (HCC)   Mitral regurgitation, moderate to severe   Chronic kidney disease, stage III (moderate)   SOB (shortness of breath)   Hypothermia   Encephalopathy, metabolic    SUBJECTIVE  Denies any CP, however feels she can't take in a deep breath. States likely related to the weather as she usually has similar symptom on a rainy day like this.   CURRENT MEDS . aspirin EC  81 mg Oral Q M,W,F  . diltiazem  120 mg Oral Daily  . enoxaparin (LOVENOX) injection  30 mg Subcutaneous Q24H  . furosemide  40 mg Oral Daily  . levothyroxine  50 mcg Oral Daily  . metoprolol succinate  25 mg Oral QHS  . senna-docusate  1 tablet Oral QHS  . sodium chloride  3 mL Intravenous Q12H    OBJECTIVE  Filed Vitals:   03/20/15 0900 03/20/15 1000 03/20/15 1051 03/20/15 1204  BP:  122/60 130/75 141/85  Pulse: 66 63  67  Temp:    97.7 F (36.5 C)  TempSrc:    Oral  Resp: 23 19  18   Height:    5\' 2"  (1.575 m)  Weight:    105 lb 3.2 oz (47.718 kg)  SpO2: 93% 93%  100%    Intake/Output Summary (Last 24 hours) at 03/20/15 1234 Last data filed at 03/20/15 1100  Gross per 24 hour  Intake    533 ml  Output   3450 ml  Net  -2917 ml   Filed Weights   03/19/15 1019 03/20/15 0500 03/20/15 1204  Weight: 107 lb 8 oz (48.762 kg) 105 lb 3.2 oz (47.718 kg) 105 lb 3.2 oz (47.718 kg)    PHYSICAL EXAM  General: Pleasant, NAD. Neuro: Alert and oriented X 3. Moves all extremities spontaneously. Psych: Normal affect. HEENT:  Normal  Neck: Supple without bruits or JVD. Lungs:  Resp regular and unlabored, CTA. Heart: irregular. no s3, s4.  3/6 systolic murmur Abdomen: Soft, non-tender, non-distended, BS + x 4.  Extremities: No clubbing, cyanosis. DP/PT/Radials 2+ and equal bilaterally. Compression stocking in place, 1+ pitting edema esp near ankles  Accessory Clinical Findings  CBC  Recent Labs  03/19/15 0320 03/20/15 0322  WBC 6.4 6.8  HGB 12.0 11.9*  HCT 36.4 36.6  MCV 82.5 83.9  PLT 167 628   Basic Metabolic Panel  Recent Labs  03/18/15 2342  03/19/15 1427 03/19/15 2030 03/20/15 0322  NA 122*  < > 129* 127* 131*  K 3.6  < > 3.4* 3.4* 3.3*  CL 77*  < > 84* 82* 85*  CO2 34*  < > 34* 35* 35*  GLUCOSE 137*  < > 86 117* 110*  BUN 33*  < > 39* 46* 46*  CREATININE 1.46*  < > 1.49* 1.78* 1.71*  CALCIUM 9.1  < > 9.2 8.7* 8.8*  MG 1.8  --   --   --  1.7  PHOS  --   --  3.1  --   --   < > = values in this interval not displayed. Liver Function Tests  Recent Labs  03/19/15 0320 03/19/15 1427  AST 51* 44*  ALT 21 22  ALKPHOS 81 83  BILITOT 1.1 1.2  PROT 5.7* 6.0*  ALBUMIN 3.3* 3.4*   Cardiac Enzymes  Recent Labs  03/18/15 1935 03/18/15 2342 03/19/15 0545  TROPONINI 0.08* 0.08* 0.10*   Thyroid Function Tests  Recent Labs  03/18/15 1935  TSH 2.889    TELE A-fib rate controlled    ECG  No new EKG  Echocardiogram 03/19/2015  LV EF: 50% -  55%  ------------------------------------------------------------------- Indications:   Dyspnea 786.09.  ------------------------------------------------------------------- History:  PMH:  Atrial fibrillation. Primary pulmonary hypertension. Risk factors: Hypertension.  ------------------------------------------------------------------- Study Conclusions  - Left ventricle: The cavity size was normal. Wall thickness was increased in a pattern of mild LVH. Systolic function was normal. The estimated ejection fraction was in the range of 50% to 55%. Regional wall motion abnormalities cannot be excluded. - Ventricular septum: The  contour showed diastolic flattening. - Aortic valve: There was mild to moderate regurgitation. - Mitral valve: Calcified annulus. There was severe regurgitation. - Left atrium: The atrium was severely dilated. - Right ventricle: Systolic function was mildly reduced. - Right atrium: The atrium was massively dilated. - Atrial septum: No defect or patent foramen ovale was identified. - Tricuspid valve: There was severe regurgitation. - Pulmonary arteries: PA peak pressure: 74 mm Hg (S).    Radiology/Studies  Dg Chest 2 View  03/18/2015   CLINICAL DATA:  Shortness of breath several days, weakness, history of CHF, valvular heart disease, pulmonary hypertension, previous history of tobacco use.  EXAM: CHEST  2 VIEW  COMPARISON:  PA and lateral chest x-ray of June 07, 2014  FINDINGS: The lungs remain hyperinflated with hemidiaphragm flattening and increased AP dimension of the thorax. There is minimal density projecting over the right lateral costophrenic gutter. The cardiac silhouette remains enlarged. The pulmonary vascularity is not engorged. There is no significant pleural effusion. There is prominent thoracic kyphosis and there is mild levocurvature of the thoracolumbar junction.  IMPRESSION: COPD and cardiomegaly. There is no pulmonary edema or alveolar pneumonia. Subsegmental atelectasis is present laterally overlying right cardiophrenic angle.   Electronically Signed   By: David  Martinique M.D.   On: 03/18/2015 14:37   Ct Head Wo Contrast  03/18/2015   CLINICAL DATA:  Acute encephalopathy.  Altered mental status.  EXAM: CT HEAD WITHOUT CONTRAST  TECHNIQUE: Contiguous axial images were obtained from the base of the skull through the vertex without intravenous contrast.  COMPARISON:  05/29/2006  FINDINGS: There is no intracranial hemorrhage, mass or evidence of acute infarction. There is mild generalized atrophy. There is mild chronic microvascular ischemic change. There is no significant  extra-axial fluid collection.  No acute intracranial findings are evident. The visible paranasal sinuses are clear. Mild chronic sclerosis of the right mastoid air cells.  IMPRESSION: Mild generalized atrophy.  No acute intracranial findings.   Electronically Signed   By: Andreas Newport M.D.   On: 03/18/2015 23:26    ASSESSMENT AND PLAN  Patient Profile: 81 yo female w/ hx chronic afib, HTN, pulm HTN, [employer], EF 55%, w/ severe TR/MR, mild-mod AI, PAS 64 2015. Admitted 10/05 w/ R heart failure, Na+ 122, started on Tolvaptan  1. Acute on chronic right HF   - Echo 03/19/2015 EF 50-55%, mild to moderate AR, severe MR, severe dilated LA, mildly reduced RVEF with massive dilated RA, PA peak pressure 74  - weight down 11 --> 105, I/O -3.3 L  - on ASA, diltiazem, metoprolol. Transitioned to PO lasix today after Cr  trended up from 1.49 to 1.71 after diuresing 3L in a single day  - still has some pitting edema bilaterally esp near ankle area, continue compression stocking, lung is clear on exam. Will need to monitor her urine output on 40mg  daily lasix, previously on 1mg  Bumex BID which roughly equate to 40mg  PO lasix BID. Possible discharge tomorrow   2. Severe Hyponatremia with Na 120  - improved on Talvaptan which has since been discontinued, Na 131  3. Chronic atrial fibrillation refused anticoagulation ([employer])  4. Moderate to severe MR  5. Pulm HTN 6. HTN 7. Hypothyroidism 8. CKD stage III  Weston Brass [place] Ku Pager: [phone]  I have seen and examined the patient along with Almyra Deforest PA-C.  I have reviewed the chart, notes and new data.  I agree with PA's note.  Key new complaints: feels a little more dyspneic (she attributes this to the weather), despite substantial diuresis; confusion has resolved completely Key examination changes: still with marked JVD to angle of jaw and 3+ dependent edema, but clear lungs Key new findings / data: improved Na 131, slight  increase in creatinine  PLAN: Continue diuretics, but can transition to PO agents. Unlikely to achieve complete resolution of edema due to the severity of her pulmonary HTN/TR/right heart failure.  Sanda Klein, MD, [place] [phone] 03/20/2015, 1:47 PM

## 2015-03-20 NOTE — Progress Notes (Signed)
Patient transferred from [facility] to 3E29.  No change in d/c plan.  Awaiting medical stability with planned dc to [facility].  CSW contacted Dr. Grandville Silos who indicated that patient may be stable for d/c tomorrow or Sunday. CSW notified Hoyle Sauer- Admissions at [facility] who indicated that they would accept patient over the weekend if d/c ordered.  CSW provided weekend written handoff re: above and request follow up with MD this weekend. Lorie Phenix. Pauline Good, [place]

## 2015-03-20 NOTE — Care Management Note (Signed)
Case Management Note  Patient Details  Name: [name] [name] MRN: [id number] Date of Birth: 11/21/1929  Subjective/Objective:    CM and CSW met with pt's friends who are coordinating caregivers for pt's sister, who has dementia.  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  Pt agreeable to SNF for rehab, CSW initiated bed search.                             Expected Discharge Plan:  [place]  In-House Referral:  Clinical Social Work  Discharge planning Services  CM Consult  Status of Service:  Completed, signed off  Brieanne Mignone, Kym Groom, [place] 03/20/2015, 10:12 AM

## 2015-03-20 NOTE — Care Management Important Message (Signed)
Important Message  Patient Details  Name: [name] [name] MRN: [id number] Date of Birth: 1929/07/16   Medicare Important Message Given:  Yes-second notification given    Nathen May 03/20/2015, 11:51 AM

## 2015-03-20 NOTE — Progress Notes (Signed)
Pt transferred to 3E29 in stable condition. Oriented to room. No complaints offered at this time. Will cont to monitor.

## 2015-03-20 NOTE — Progress Notes (Signed)
Patient is transferred to 3E29 per wheelchair.  Mr. [name] [name] is notified of this transfer.  Report given to [place], RN from [place].

## 2015-03-21 DIAGNOSIS — T68XXXS Hypothermia, sequela: Secondary | ICD-10-CM

## 2015-03-21 DIAGNOSIS — E871 Hypo-osmolality and hyponatremia: Secondary | ICD-10-CM | POA: Insufficient documentation

## 2015-03-21 DIAGNOSIS — I480 Paroxysmal atrial fibrillation: Secondary | ICD-10-CM

## 2015-03-21 LAB — RENAL FUNCTION PANEL
ALBUMIN: 3.3 g/dL — AB (ref 3.5–5.0)
Anion gap: 13 (ref 5–15)
BUN: 48 mg/dL — AB (ref 6–20)
CO2: 33 mmol/L — ABNORMAL HIGH (ref 22–32)
CREATININE: 1.64 mg/dL — AB (ref 0.44–1.00)
Calcium: 9.3 mg/dL (ref 8.9–10.3)
Chloride: 90 mmol/L — ABNORMAL LOW (ref 101–111)
GFR calc Af Amer: 32 mL/min — ABNORMAL LOW (ref >60)
GFR, EST NON AFRICAN AMERICAN: 28 mL/min — AB (ref >60)
Glucose, Bld: 107 mg/dL — ABNORMAL HIGH (ref 65–99)
PHOSPHORUS: 2.9 mg/dL (ref 2.5–4.6)
POTASSIUM: 4.9 mmol/L (ref 3.5–5.1)
Sodium: 136 mmol/L (ref 135–145)

## 2015-03-21 LAB — CBC
HCT: 40.5 % (ref 36.0–46.0)
Hemoglobin: 13 g/dL (ref 12.0–15.0)
MCH: 27.5 pg (ref 26.0–34.0)
MCHC: 32.1 g/dL (ref 30.0–36.0)
MCV: 85.6 fL (ref 78.0–100.0)
PLATELETS: 174 10*3/uL (ref 150–400)
RBC: 4.73 MIL/uL (ref 3.87–5.11)
RDW: 16.4 % — ABNORMAL HIGH (ref 11.5–15.5)
WBC: 7.1 10*3/uL (ref 4.0–10.5)

## 2015-03-21 LAB — MAGNESIUM: MAGNESIUM: 2.2 mg/dL (ref 1.7–2.4)

## 2015-03-21 LAB — GLUCOSE, CAPILLARY: GLUCOSE-CAPILLARY: 102 mg/dL — AB (ref 65–99)

## 2015-03-21 MED ORDER — SENNOSIDES-DOCUSATE SODIUM 8.6-50 MG PO TABS: 1.0000 | ORAL_TABLET | Freq: Every day | ORAL | Status: AC

## 2015-03-21 MED ORDER — ENOXAPARIN SODIUM 30 MG/0.3ML ~~LOC~~ SOLN: 30.0000 mg | SUBCUTANEOUS | Status: DC

## 2015-03-21 MED ORDER — METOPROLOL SUCCINATE ER 25 MG PO TB24: 25.0000 mg | ORAL_TABLET | Freq: Every day | ORAL | Status: AC

## 2015-03-21 NOTE — Clinical Social Work Note (Signed)
Clinical Social Worker facilitated patient discharge including contacting patient family and facility to confirm patient discharge plans.  Clinical information faxed to facility and family agreeable with plan.  CSW arranged transport via patient friend to [facility].  RN to call report prior to discharge.  Clinical Social Worker will sign off for now as social work intervention is no longer needed. Please consult [place] again if new need arises.  Barbette Or, [place]

## 2015-03-21 NOTE — Discharge Summary (Signed)
Physician Discharge Summary  [name] [name] SJG:[id number] DOB: 07-Jan-1930 DOA: 03/18/2015  PCP: Lilian Coma, MD  Admit date: 03/18/2015 Discharge date: 03/21/2015  Time spent: 65 minutes  Recommendations for Outpatient Follow-up:  1. Patient be discharged to a skilled nursing facility at [facility]. Patient will follow-up with M.D. at the skilled nursing facility. Patient will likely need a basic metabolic profile done one week post discharge to follow-up on electrolytes and renal function. Patient may benefit from palliative care consultation at the facility for goals of care. 2. Patient is to follow-up with Dr. Mare Ferrari of cardiology in about 2 weeks. Office will set up outpatient appointment.  Discharge Diagnoses:  Principal Problem:   Hyponatremia Active Problems:   Acute on chronic diastolic CHF (congestive heart failure) (HCC)   Hypothermia   Encephalopathy, metabolic   Hypothyroidism   Essential hypertension   Atrial fibrillation (HCC)   GERD   Tricuspid regurgitation   Pulmonary hypertension (HCC)   Chronic a-fib (HCC)   Mitral regurgitation, moderate to severe   Chronic kidney disease, stage III (moderate)   SOB (shortness of breath)   Discharge Condition: Stable and improved.  Diet recommendation: Heart healthy with a fluid restriction of 1200 mL per day.  Filed Weights   03/20/15 0500 03/20/15 1204 03/21/15 0400  Weight: 47.718 kg (105 lb 3.2 oz) 47.718 kg (105 lb 3.2 oz) 47.356 kg (104 lb 6.4 oz)    History of present illness:  [name] [name] is a 81 y.o. female  With history of chronic atrial fibrillation, hypertension, mild to moderate aortic insufficiency, moderate to severe mitral regurgitation, severe tricuspid regurgitation with markedly biatrial enlargement, hypothyroidism, osteoarthritis who presented to the ED with 2 week history of worsening shortness of breath and a 2 day history of hallucinations. Patient stated for the past 2 days she's  been hallucinating and gives an example that this morning she says she went to get breakfast and came back home however should know she did not go anywhere. Patient states she's been feeling bad. Patient denied any nausea, no emesis, no chest pain, no fever, no diarrhea, no melanotic, no hematochezia, no cough. Patient did endorse some chills, some abdominal pain which has since resolved, constipation, generalized weakness, worsening shortness of breath over the past 2 weeks. Patient also states has had some decreased intake. Patient denied any recent surgeries. No recent long rides. Patient stated that she doubled the dose of her diuretics this week for worsening fluid retention however noticed no significant changes with her shortness of breath. Patient went to her PCPs office and she was sent to the ED. In the emergency room, basic metabolic profile obtained had a sodium of 120 chloride of 77 BUN of 33 creatinine of 1.37 otherwise was within normal limits. CBC obtained was unremarkable. BNP was elevated at 1123. Point-of-care troponin was negative. EKG showed atrial fibrillation with LVH. Chest x-ray was negative for any acute infiltrate. Patient also noted to be hypothermic with a temperature of 94.3 Lab work was ordered and 3% saline was ordered. ED. [facility] hospitalist will call to admit the patient for further evaluation and management.    Hospital Course:  #1 severe acute on chronic hyponatremia Likely secondary to hypervolemic hyponatremia. Serum osmolality was low at 266. Urine osmolality was low at 262. Specific gravity was less than 1.005. Urine sodium was 56. Urine creatinine 19.95. TSH within normal limits at 2.889. Random cortisol level was 20.8. CT head negative for any acute abnormalities. Chest x-ray negative for any acute  abnormalities. Serum sodium was 120 on admission. Nephrology was consulted and patient was started on tolvaptan with fluid restriction and diuresis. Patient's sodium  levels improved and to overlap them was subsequently discontinued. Patient was subsequently transitioned from IV Lasix to oral Lasix. Patient's sodium by day of discharge had normalized and was at 136. Patient will be discharged in stable and improved condition on a home regimen of diuretics and the fluid restriction. Outpatient follow-up.   #2 acute on chronic right-sided diastolic heart failure Questionable etiology. Troponins slightly elevated however likely secondary to CHF exacerbation. 2-D echo with EF of 50-55%. Mild to moderate aortic valvular regurgitation. Severe mitral valvular regurgitation, severe tricuspid valvular regurgitation. Mildly reduced right ventricular systolic function. Massively dilated right atrium. Patient was admitted placed on IV Lasix and monitored. Patient had good diuresis. Patient was also maintained on home regimen of aspirin, metoprolol, Cardizem. Patient diuresed well during the hospitalization and was -3.6 L during the hospitalization. Patient was followed by cardiology and patient subsequently transitioned to oral Lasix. On day of discharge it was noted that patient did still have some pitting edema bilaterally and was on compression stockings. It was felt by cardiology that it was unlikely that patient will achieve complete resolution of edema due to the severity of her pulmonary hypertension, tricuspid regurgitation, right heart failure. It was felt per cardiology that patient was stable for discharge. Patient be discharged back home on a home regimen of Bumex 1 mg twice daily as well as spironolactone 12.5 mg daily. Patient will follow-up with cardiology as outpatient.  #3 acute metabolic encephalopathy Likely secondary to problem #1. Patient on admission stated she had some hallucinations. It was felt this was secondary to patient's severe hyponatremia. CT head which was done was negative. Infectious workup done was also negative. TSH was within normal limits. As  hyponatremia improved patient had no further hallucinations and was back to baseline by day of discharge.   #4 hypothermia Unknown etiology. Patient was initially admitted to the stepdown unit and placed on the bear hugger. Infectious workup which was done was negative. CT head which was done was negative. Patient improved and hypothermia had resolved by day of discharge.   #5 chronic atrial fibrillation CHADS2VASC score 5. Patient was maintained on Cardizem and metoprolol for rate control. Patient has refused anticoagulation in the past secondary to being a Jehovah's Witness. Aspirin.  #6 multi-valvular disease(MR/TR/Pulm HTN) Patient not considered an operative candidate secondary to frailty and refusal of blood products due to been a Jehovah's Witness. 2-D echo with EF of 50-55%. Mild to moderate aortic valvular regurgitation. Severe mitral valvular regurgitation, severe tricuspid valvular regurgitation. Mildly reduced right ventricular systolic function. Massively dilated right atrium. Patient was maintained on medical management with aspirin, Cardizem, metoprolol, Lasix. Cardiology followed throughout the hospitalization.  #7 chronic kidney disease stage III Stable.  #8 hypothyroidism TSH within normal limits. Continued on home dose Synthroid.  #9 hypertension  Patient was placed on Cardizem and metoprolol.   Procedures:  CT head 03/18/2015  Chest x-ray 03/18/2015  2-D echo 03/19/2015    Consultations:  Cardiology: Dr. Debara Pickett 03/18/2015  Nephrology: Dr. Augustin Coupe 03/18/2015    Discharge Exam: Filed Vitals:   03/21/15 1355  BP: 148/79  Pulse: 77  Temp: 97.2 F (36.2 C)  Resp: 18    General: NAD Cardiovascular: RRR with 3/6 SEM Respiratory: CTAB  Discharge Instructions   Discharge Instructions    Diet - low sodium heart healthy    Complete by:  As  directed   1200cc/day fluid restriction     Discharge instructions    Complete by:  As directed   Follow up with  Dr Mare Ferrari in 2 weeks, appointment will be set up by cardiology.     Increase activity slowly    Complete by:  As directed           Current Discharge Medication List    START taking these medications   Details  enoxaparin (LOVENOX) 30 MG/0.3ML injection Inject 0.3 mLs (30 mg total) into the skin daily. Qty: 0 Syringe    senna-docusate (SENOKOT-S) 8.6-50 MG tablet Take 1 tablet by mouth at bedtime.      CONTINUE these medications which have CHANGED   Details  metoprolol succinate (TOPROL-XL) 25 MG 24 hr tablet Take 1 tablet (25 mg total) by mouth at bedtime. Qty: 30 tablet, Refills: 0      CONTINUE these medications which have NOT CHANGED   Details  aspirin 81 MG tablet Take 81 mg by mouth. Mondays, Wednesdays, and Fridays    bumetanide (BUMEX) 1 MG tablet Take 1 mg by mouth 2 (two) times daily.    CARTIA XT 120 MG 24 hr capsule TAKE 1 CAPSULE BY MOUTH DAILY Qty: 30 capsule, Refills: 11    levothyroxine (SYNTHROID, LEVOTHROID) 50 MCG tablet TAKE ONE TABLET BY MOUTH ONE TIME DAILY Qty: 30 tablet, Refills: 5    spironolactone (ALDACTONE) 25 MG tablet Take 0.5 tablets (12.5 mg total) by mouth daily. Qty: 15 tablet, Refills: 1       Allergies  Allergen Reactions  . Amlodipine Other (See Comments)    Pain in jaw, and peripheral edema  . Other Other (See Comments)    PT IS OF [place]. NO BLOOD PRODUCTS.  . Ramipril Other (See Comments)    Dyspnea - Tolerates lisinopril  . Cozaar Other (See Comments)    insomnia  . Edecrin [Ethacrynic Acid] Nausea And Vomiting  . Hctz [Hydrochlorothiazide] Rash    Takes Triamterene/HCTZ at home.  . Sulfa Antibiotics Itching   Follow-up Information    Follow up with Warren Danes, MD. Schedule an appointment as soon as possible for a visit in 2 weeks.   Specialty:  Cardiology   Why:  Office will set up appointment time   Contact information:   [address] [place] [address] [phone]        Please follow up.   Why:  f/u with MD from SNF       The results of significant diagnostics from this hospitalization (including imaging, microbiology, ancillary and laboratory) are listed below for reference.    Significant Diagnostic Studies: Dg Chest 2 View  03/18/2015   CLINICAL DATA:  Shortness of breath several days, weakness, history of CHF, valvular heart disease, pulmonary hypertension, previous history of tobacco use.  EXAM: CHEST  2 VIEW  COMPARISON:  PA and lateral chest x-ray of June 07, 2014  FINDINGS: The lungs remain hyperinflated with hemidiaphragm flattening and increased AP dimension of the thorax. There is minimal density projecting over the right lateral costophrenic gutter. The cardiac silhouette remains enlarged. The pulmonary vascularity is not engorged. There is no significant pleural effusion. There is prominent thoracic kyphosis and there is mild levocurvature of the thoracolumbar junction.  IMPRESSION: COPD and cardiomegaly. There is no pulmonary edema or alveolar pneumonia. Subsegmental atelectasis is present laterally overlying right cardiophrenic angle.   Electronically Signed   By: David  Martinique M.D.   On:  03/18/2015 14:37   Ct Head Wo Contrast  03/18/2015   CLINICAL DATA:  Acute encephalopathy.  Altered mental status.  EXAM: CT HEAD WITHOUT CONTRAST  TECHNIQUE: Contiguous axial images were obtained from the base of the skull through the vertex without intravenous contrast.  COMPARISON:  05/29/2006  FINDINGS: There is no intracranial hemorrhage, mass or evidence of acute infarction. There is mild generalized atrophy. There is mild chronic microvascular ischemic change. There is no significant extra-axial fluid collection.  No acute intracranial findings are evident. The visible paranasal sinuses are clear. Mild chronic sclerosis of the right mastoid air cells.  IMPRESSION: Mild generalized atrophy.  No acute intracranial findings.   Electronically Signed   By: Andreas Newport M.D.   On: 03/18/2015 23:26    Microbiology: Recent Results (from the past 240 hour(s))  MRSA PCR Screening     Status: None   Collection Time: 03/18/15  7:26 PM  Result Value Ref Range Status   MRSA by PCR NEGATIVE NEGATIVE Final    Comment:        The GeneXpert MRSA Assay (FDA approved for NASAL specimens only), is one component of a comprehensive MRSA colonization surveillance program. It is not intended to diagnose MRSA infection nor to guide or monitor treatment for MRSA infections.   Blood culture (routine x 2)     Status: None (Preliminary result)   Collection Time: 03/18/15  7:35 PM  Result Value Ref Range Status   Specimen Description BLOOD LEFT ARM  Final   Special Requests BOTTLES DRAWN AEROBIC AND ANAEROBIC 10CC  Final   Culture NO GROWTH 3 DAYS  Final   Report Status PENDING  Incomplete  Blood culture (routine x 2)     Status: None (Preliminary result)   Collection Time: 03/18/15  7:35 PM  Result Value Ref Range Status   Specimen Description BLOOD LEFT WRIST  Final   Special Requests IN PEDIATRIC BOTTLE 2CC  Final   Culture NO GROWTH 3 DAYS  Final   Report Status PENDING  Incomplete  Culture, Urine     Status: None   Collection Time: 03/18/15 11:54 PM  Result Value Ref Range Status   Specimen Description URINE, CATHETERIZED  Final   Special Requests NONE  Final   Culture NO GROWTH 1 DAY  Final   Report Status 03/20/2015 FINAL  Final     Labs: Basic Metabolic Panel:  Recent Labs Lab 03/18/15 2342  03/19/15 0920 03/19/15 1427 03/19/15 2030 03/20/15 0322 03/21/15 0420  NA 122*  < > 126* 129* 127* 131* 136  K 3.6  < > 4.0 3.4* 3.4* 3.3* 4.9  CL 77*  < > 83* 84* 82* 85* 90*  CO2 34*  < > 32 34* 35* 35* 33*  GLUCOSE 137*  < > 101* 86 117* 110* 107*  BUN 33*  < > 38* 39* 46* 46* 48*  CREATININE 1.46*  < > 1.53* 1.49* 1.78* 1.71* 1.64*  CALCIUM 9.1  < > 9.0 9.2 8.7* 8.8* 9.3  MG 1.8  --   --   --   --  1.7 2.2  PHOS  --   --   --  3.1  --    --  2.9  < > = values in this interval not displayed. Liver Function Tests:  Recent Labs Lab 03/19/15 0320 03/19/15 1427 03/21/15 0420  AST 51* 44*  --   ALT 21 22  --   ALKPHOS 81 83  --  BILITOT 1.1 1.2  --   PROT 5.7* 6.0*  --   ALBUMIN 3.3* 3.4* 3.3*   No results for input(s): LIPASE, AMYLASE in the last 168 hours. No results for input(s): AMMONIA in the last 168 hours. CBC:  Recent Labs Lab 03/18/15 1332 03/19/15 0320 03/20/15 0322 03/21/15 0420  WBC 5.7 6.4 6.8 7.1  HGB 12.2 12.0 11.9* 13.0  HCT 37.1 36.4 36.6 40.5  MCV 81.9 82.5 83.9 85.6  PLT 163 167 157 174   Cardiac Enzymes:  Recent Labs Lab 03/18/15 1935 03/18/15 2342 03/19/15 0545  TROPONINI 0.08* 0.08* 0.10*   BNP: BNP (last 3 results)  Recent Labs  06/14/14 0419 03/18/15 1557 03/19/15 0545  BNP 428.8* 1123.0* 1487.0*    ProBNP (last 3 results) No results for input(s): PROBNP in the last 8760 hours.  CBG:  Recent Labs Lab 03/21/15 0718  GLUCAP 102*       Signed:  Madysin Crisp MD [facility] 03/21/2015, 2:05 PM

## 2015-03-21 NOTE — Clinical Social Work Placement (Signed)
   CLINICAL SOCIAL WORK PLACEMENT  NOTE  Date:  03/21/2015  Patient Details  Name: Constanza Mincy MRN: [id number] Date of Birth: [birth year]  Clinical Social Work is seeking post-discharge placement for this patient at the [place] level of care (*CSW will initial, date and re-position this form in  chart as items are completed):  Yes   Patient/family provided with [facility] Work Department's list of facilities offering this level of care within the geographic area requested by the patient (or if unable, by the patient's family).  Yes   Patient/family informed of their freedom to choose among providers that offer the needed level of care, that participate in Medicare, Medicaid or managed care program needed by the patient, have an available bed and are willing to accept the patient.  Yes   Patient/family informed of [facility]'s ownership interest in [facility] and [facility], as well as of the fact that they are under no obligation to receive care at these facilities.  PASRR submitted to EDS on       PASRR number received on       Existing PASRR number confirmed on       FL2 transmitted to all facilities in geographic area requested by pt/family on 03/18/15     FL2 transmitted to all facilities within larger geographic area on       Patient informed that his/her managed care company has contracts with or will negotiate with certain facilities, including the following:        Yes   Patient/family informed of bed offers received.  Patient chooses bed at [facility]     Physician recommends and patient chooses bed at      Patient to be transferred to [facility] on 03/21/15.  Patient to be transferred to facility by [facility]     Patient family notified on 03/21/15 of transfer.  Name of family member notified:  Patient notified family friend who is providing transport     PHYSICIAN Please sign FL2     Additional Comment:     Barbette Or, [place]

## 2015-03-21 NOTE — Progress Notes (Signed)
Patient Name: [name] [name] Date of Encounter: 03/21/2015  Primary Cardiologist: Dr Mare Ferrari   Principal Problem:   Hyponatremia Active Problems:   Hypothyroidism   Essential hypertension   Atrial fibrillation (HCC)   GERD   Tricuspid regurgitation   Pulmonary hypertension (HCC)   Acute on chronic diastolic CHF (congestive heart failure) (HCC)   Chronic a-fib (HCC)   Mitral regurgitation, moderate to severe   Chronic kidney disease, stage III (moderate)   SOB (shortness of breath)   Hypothermia   Encephalopathy, metabolic   SUBJECTIVE  Denies any CP, however feels she can't take in a deep breath. States likely related to the weather as she usually has similar symptom on a rainy day like this.   CURRENT MEDS . aspirin EC  81 mg Oral Q M,W,F  . diltiazem  120 mg Oral Daily  . enoxaparin (LOVENOX) injection  30 mg Subcutaneous Q24H  . furosemide  40 mg Oral Daily  . levothyroxine  50 mcg Oral Daily  . metoprolol succinate  25 mg Oral QHS  . senna-docusate  1 tablet Oral QHS  . sodium chloride  3 mL Intravenous Q12H    OBJECTIVE  Filed Vitals:   03/20/15 1204 03/20/15 1545 03/20/15 2015 03/21/15 0400  BP: 141/85 119/68 130/70   Pulse: 67 65 78 90  Temp: 97.7 F (36.5 C) 97.5 F (36.4 C) 97.9 F (36.6 C) 97.9 F (36.6 C)  TempSrc: Oral Oral Oral Oral  Resp: 18 17 18 18   Height: 5\' 2"  (1.575 m)     Weight: 105 lb 3.2 oz (47.718 kg)   104 lb 6.4 oz (47.356 kg)  SpO2: 100% 99% 93% 91%    Intake/Output Summary (Last 24 hours) at 03/21/15 1141 Last data filed at 03/21/15 1000  Gross per 24 hour  Intake    720 ml  Output    950 ml  Net   -230 ml   Filed Weights   03/20/15 0500 03/20/15 1204 03/21/15 0400  Weight: 105 lb 3.2 oz (47.718 kg) 105 lb 3.2 oz (47.718 kg) 104 lb 6.4 oz (47.356 kg)    PHYSICAL EXAM  General: Pleasant, NAD. Neuro: Alert and oriented X 3. Moves all extremities spontaneously. Psych: Normal affect. HEENT:  Normal  Neck: Supple  without bruits or JVD. Lungs:  Resp regular and unlabored, CTA. Heart: irregular. no s3, s4. 3/6 systolic murmur Abdomen: Soft, non-tender, non-distended, BS + x 4.  Extremities: No clubbing, cyanosis. DP/PT/Radials 2+ and equal bilaterally. Compression stocking in place, 1+ pitting edema esp near ankles  Accessory Clinical Findings  CBC  Recent Labs  03/20/15 0322 03/21/15 0420  WBC 6.8 7.1  HGB 11.9* 13.0  HCT 36.6 40.5  MCV 83.9 85.6  PLT 157 268   Basic Metabolic Panel  Recent Labs  03/19/15 1427  03/20/15 0322 03/21/15 0420  NA 129*  < > 131* 136  K 3.4*  < > 3.3* 4.9  CL 84*  < > 85* 90*  CO2 34*  < > 35* 33*  GLUCOSE 86  < > 110* 107*  BUN 39*  < > 46* 48*  CREATININE 1.49*  < > 1.71* 1.64*  CALCIUM 9.2  < > 8.8* 9.3  MG  --   --  1.7 2.2  PHOS 3.1  --   --  2.9  < > = values in this interval not displayed. Liver Function Tests  Recent Labs  03/19/15 0320 03/19/15 1427 03/21/15 0420  AST 51* 44*  --  ALT 21 22  --   ALKPHOS 81 83  --   BILITOT 1.1 1.2  --   PROT 5.7* 6.0*  --   ALBUMIN 3.3* 3.4* 3.3*   Cardiac Enzymes  Recent Labs  03/18/15 1935 03/18/15 2342 03/19/15 0545  TROPONINI 0.08* 0.08* 0.10*   Thyroid Function Tests  Recent Labs  03/18/15 1935  TSH 2.889    TELE A-fib rate controlled    ECG  No new EKG  Echocardiogram 03/19/2015  LV EF: 50% -  55%  ------------------------------------------------------------------- Indications:   Dyspnea 786.09.  ------------------------------------------------------------------- History:  PMH:  Atrial fibrillation. Primary pulmonary hypertension. Risk factors: Hypertension.  ------------------------------------------------------------------- Study Conclusions  - Left ventricle: The cavity size was normal. Wall thickness was increased in a pattern of mild LVH. Systolic function was normal. The estimated ejection fraction was in the range of 50% to  55%. Regional wall motion abnormalities cannot be excluded. - Ventricular septum: The contour showed diastolic flattening. - Aortic valve: There was mild to moderate regurgitation. - Mitral valve: Calcified annulus. There was severe regurgitation. - Left atrium: The atrium was severely dilated. - Right ventricle: Systolic function was mildly reduced. - Right atrium: The atrium was massively dilated. - Atrial septum: No defect or patent foramen ovale was identified. - Tricuspid valve: There was severe regurgitation. - Pulmonary arteries: PA peak pressure: 74 mm Hg (S).    Radiology/Studies  Dg Chest 2 View  03/18/2015   CLINICAL DATA:  Shortness of breath several days, weakness, history of CHF, valvular heart disease, pulmonary hypertension, previous history of tobacco use.  EXAM: CHEST  2 VIEW  COMPARISON:  PA and lateral chest x-ray of June 07, 2014  FINDINGS: The lungs remain hyperinflated with hemidiaphragm flattening and increased AP dimension of the thorax. There is minimal density projecting over the right lateral costophrenic gutter. The cardiac silhouette remains enlarged. The pulmonary vascularity is not engorged. There is no significant pleural effusion. There is prominent thoracic kyphosis and there is mild levocurvature of the thoracolumbar junction.  IMPRESSION: COPD and cardiomegaly. There is no pulmonary edema or alveolar pneumonia. Subsegmental atelectasis is present laterally overlying right cardiophrenic angle.   Electronically Signed   By: David  Martinique M.D.   On: 03/18/2015 14:37   Ct Head Wo Contrast  03/18/2015   CLINICAL DATA:  Acute encephalopathy.  Altered mental status.  EXAM: CT HEAD WITHOUT CONTRAST  TECHNIQUE: Contiguous axial images were obtained from the base of the skull through the vertex without intravenous contrast.  COMPARISON:  05/29/2006  FINDINGS: There is no intracranial hemorrhage, mass or evidence of acute infarction. There is mild generalized  atrophy. There is mild chronic microvascular ischemic change. There is no significant extra-axial fluid collection.  No acute intracranial findings are evident. The visible paranasal sinuses are clear. Mild chronic sclerosis of the right mastoid air cells.  IMPRESSION: Mild generalized atrophy.  No acute intracranial findings.   Electronically Signed   By: Andreas Newport M.D.   On: 03/18/2015 23:26    ASSESSMENT AND PLAN  Patient Profile: 81 yo female w/ hx chronic afib, HTN, pulm HTN, Jehovah's Witness, EF 55%, w/ severe TR/MR, mild-mod AI, PAS 64 2015. Admitted 10/05 w/ R heart failure, Na+ 122, started on Tolvaptan  1. Acute on chronic right HF   - Echo 03/19/2015 EF 50-55%, mild to moderate AR, severe MR, severe dilated LA, mildly reduced RVEF with massive dilated RA, PA peak pressure 74  - weight down 111 --> 104, I/O -3.5 L  -  on ASA, diltiazem, metoprolol. Transitioned to PO lasix yesterday after Cr trended up from 1.49 to 1.71 --1.64, after diuresing 3L in a single day  - still has some pitting edema bilaterally esp near ankle area, continue compression stocking, lung is clear on exam. Will need to monitor her urine output on 40mg  daily lasix, previously on 1mg  Bumex BID which roughly equate to 40mg  PO lasix BID. Possible discharge tomorrow  -Unlikely to achieve complete resolution of edema due to the severity of her pulmonary HTN/TR/right heart failure.  2. Severe Hyponatremia with Na 120, improved to 136  - improved on Talvaptan which has since been discontinued  3. Chronic atrial fibrillation refused anticoagulation (Jehovah's Witness)  4. Moderate to severe MR  5. Pulm HTN 6. HTN 7. Hypothyroidism 8. CKD stage III  The patient can be discharged from cardiac standpoint. Awaiting placement to SNF.  Dorothy Spark [facility] [phone] 03/21/2015, 11:41 AM

## 2015-03-23 ENCOUNTER — Non-Acute Institutional Stay (SKILLED_NURSING_FACILITY): Payer: Medicare Other | Admitting: Adult Health

## 2015-03-23 ENCOUNTER — Encounter: Payer: Self-pay | Admitting: Adult Health

## 2015-03-23 DIAGNOSIS — E871 Hypo-osmolality and hyponatremia: Secondary | ICD-10-CM | POA: Diagnosis not present

## 2015-03-23 DIAGNOSIS — E039 Hypothyroidism, unspecified: Secondary | ICD-10-CM

## 2015-03-23 DIAGNOSIS — I482 Chronic atrial fibrillation, unspecified: Secondary | ICD-10-CM

## 2015-03-23 DIAGNOSIS — I10 Essential (primary) hypertension: Secondary | ICD-10-CM | POA: Diagnosis not present

## 2015-03-23 DIAGNOSIS — N183 Chronic kidney disease, stage 3 unspecified: Secondary | ICD-10-CM

## 2015-03-23 DIAGNOSIS — I38 Endocarditis, valve unspecified: Secondary | ICD-10-CM

## 2015-03-23 DIAGNOSIS — I5032 Chronic diastolic (congestive) heart failure: Secondary | ICD-10-CM | POA: Diagnosis not present

## 2015-03-23 DIAGNOSIS — K5900 Constipation, unspecified: Secondary | ICD-10-CM

## 2015-03-23 DIAGNOSIS — R5381 Other malaise: Secondary | ICD-10-CM

## 2015-03-23 DIAGNOSIS — G9341 Metabolic encephalopathy: Secondary | ICD-10-CM

## 2015-03-23 LAB — CULTURE, BLOOD (ROUTINE X 2)
Culture: NO GROWTH
Culture: NO GROWTH

## 2015-03-24 LAB — CBC AND DIFFERENTIAL
HCT: 39 % (ref 36–46)
Hemoglobin: 12 g/dL (ref 12.0–16.0)
Platelets: 193 10*3/uL (ref 150–399)
WBC: 5.9 10^3/mL

## 2015-03-26 ENCOUNTER — Other Ambulatory Visit: Payer: Medicare Other

## 2015-03-27 ENCOUNTER — Non-Acute Institutional Stay (SKILLED_NURSING_FACILITY): Payer: Medicare Other | Admitting: Internal Medicine

## 2015-03-27 ENCOUNTER — Encounter: Payer: Self-pay | Admitting: Internal Medicine

## 2015-03-27 DIAGNOSIS — I482 Chronic atrial fibrillation, unspecified: Secondary | ICD-10-CM

## 2015-03-27 DIAGNOSIS — I10 Essential (primary) hypertension: Secondary | ICD-10-CM

## 2015-03-27 DIAGNOSIS — N183 Chronic kidney disease, stage 3 unspecified: Secondary | ICD-10-CM

## 2015-03-27 DIAGNOSIS — I5032 Chronic diastolic (congestive) heart failure: Secondary | ICD-10-CM | POA: Diagnosis not present

## 2015-03-27 DIAGNOSIS — R0602 Shortness of breath: Secondary | ICD-10-CM

## 2015-03-27 DIAGNOSIS — E871 Hypo-osmolality and hyponatremia: Secondary | ICD-10-CM | POA: Diagnosis not present

## 2015-03-27 DIAGNOSIS — R609 Edema, unspecified: Secondary | ICD-10-CM | POA: Diagnosis not present

## 2015-03-27 DIAGNOSIS — E039 Hypothyroidism, unspecified: Secondary | ICD-10-CM

## 2015-03-27 DIAGNOSIS — R5381 Other malaise: Secondary | ICD-10-CM | POA: Diagnosis not present

## 2015-03-27 NOTE — Progress Notes (Signed)
Patient ID: [name] [name], female   DOB: 1930/01/23, 81 y.o.   MRN: [id number]    HISTORY AND PHYSICAL  Location:  [facility] Room Number: 1202 Place of Service: SNF (31)   Extended Emergency Contact Information Primary Emergency Contact: Gallant,Katherine Address: [place], [place] [address] Johnnette Litter of [place] Phone: [phone] Mobile Phone: [phone] Relation: Sister Secondary Emergency Contact: [place] of [place] Mobile Phone: [phone] Relation: Friend  Advanced Directive information Does patient have an advance directive?: No, Would patient like information on creating an advanced directive?: No - patient declined information  Chief Complaint  Patient presents with  . New Admit To SNF    Following hospitalization for hyponatremia    HPI:  Patient was advised admission to skilled nursing facility 03/21/2015 following hospitalization from 03/18/2015 through 03/21/2015. Patient was hospitalized at that time predominantly for hyponatremia with sodium level down to 120 mEq per liter. She also had acute on chronic diastolic congestive heart failure. BNP was measured at 1123. 2-D echocardiogram was done and showed a 50-55% LVEF. Patient was encephalopathic at the time of admission. This resolved during the course of her hospitalization.  She was discharged to the nursing home for further strengthening, increase in self-care skills, and stabilization of her sodium level.  This patient lived with her demented sister until her hospitalization. Her sister has been moved to a memory care unit at [facility], an assisted living facility. Patient is eager to gain strength and return home. She realizes she can no longer care for her sister.  Patient has known tricuspid regurgitation clearly has right-sided heart failure with 3+ bipedal edema. She has previous diagnosis of pulmonary  hypertension and chronic atrial fibrillation.  She has not unstable gait and is currently using a walker and getting physical therapy. She has not used a walker in the past.  Currently she has dyspnea on exertion while getting physical therapy.  There are multiple other medical diagnoses documented below which appear to be reasonably stable.  Past Medical History  Diagnosis Date  . Mitral valve disease   . Mitral regurgitation     to   . Hypothyroidism   . B12 deficiency   . Heart murmur     Multi valvular heart disease involving aortic, tricuspid, and mitral valves.  . OA (osteoarthritis)   . Migraines   . Atrial fibrillation ([place])     pt declines anticoag (jehovah's witness)  . Cardiomegaly     on CT scan 03/2011  . Tricuspid regurgitation     Moderate to severe  . Chronic diastolic (congestive) heart failure ([place])   . Hypertension   . GERD (gastroesophageal reflux disease)   . Pulmonary hypertension ([place])   . Aortic valve regurgitation   . CKD (chronic kidney disease) stage 3, GFR 30-59 ml/min   . Dyspnea   . Edema   . Unstable gait   . Cerebral atrophy   . COPD (chronic obstructive pulmonary disease) ([place])   . Cerebrovascular disease     Small vessel disease  . Urinary incontinence     Past Surgical History  Procedure Laterality Date  . Tonsillectomy    . Nasal endoscopy with epistaxis control  07/15/2012    Procedure: NASAL ENDOSCOPY WITH EPISTAXIS CONTROL;  Surgeon: Izora Gala, MD;  Location: [facility];  Service: ENT;  Laterality: N/A;    Patient Care Team: Jonathon Jordan,  MD as PCP - General (Family Medicine) Darlin Coco, MD as Consulting Physician (Cardiology) Inda Castle, MD (Gastroenterology)  Social History   Social History  . Marital Status: Single    Spouse Name: N/A  . Number of Children: N/A  . Years of Education: N/A   Occupational History  . Retired     Pharmacist, hospital   Social History Main Topics  . Smoking status: Former Research scientist (life sciences)  .  Smokeless tobacco: Never Used  . Alcohol Use: Yes     Comment: Social  . Drug Use: No  . Sexual Activity: Not on file   Other Topics Concern  . Not on file   Social History Narrative   Patient is Jehovah Witness, no blood products allowed.    reports that she has quit smoking. She has never used smokeless tobacco. She reports that she drinks alcohol. She reports that she does not use illicit drugs.  Family History  Problem Relation Age of Onset  . Stroke Mother   . Hypertension Mother   . Arthritis Father   . Stomach cancer Father    Family Status  Relation Status Death Age  . Mother Deceased   . Father Deceased   . Maternal Grandmother Deceased   . Maternal Grandfather Deceased   . Paternal Grandmother Deceased   . Paternal Grandfather Deceased     Immunization History  Administered Date(s) Administered  . Td 07/25/2008    Allergies  Allergen Reactions  . Amlodipine Other (See Comments)    Pain in jaw, and peripheral edema  . Other Other (See Comments)    PT IS OF [place]. NO BLOOD PRODUCTS.  . Ramipril Other (See Comments)    Dyspnea - Tolerates lisinopril  . Cozaar Other (See Comments)    insomnia  . Edecrin [Ethacrynic Acid] Nausea And Vomiting  . Hctz [Hydrochlorothiazide] Rash    Takes Triamterene/HCTZ at home.  . Sulfa Antibiotics Itching    Medications: Patient's Medications  New Prescriptions   No medications on file  Previous Medications   ASPIRIN 81 MG TABLET    Take 81 mg by mouth. Mondays, Wednesdays, and Fridays   BUMETANIDE (BUMEX) 1 MG TABLET    Take 1 mg by mouth 2 (two) times daily.   CARTIA XT 120 MG 24 HR CAPSULE    TAKE 1 CAPSULE BY MOUTH DAILY   ENOXAPARIN (LOVENOX) 30 MG/0.3ML INJECTION    Inject 0.3 mLs (30 mg total) into the skin daily.   LEVOTHYROXINE (SYNTHROID, LEVOTHROID) 50 MCG TABLET    TAKE ONE TABLET BY MOUTH ONE TIME DAILY   METOPROLOL SUCCINATE (TOPROL-XL) 25 MG 24 HR TABLET    Take 1 tablet (25 mg total)  by mouth at bedtime.   SENNA-DOCUSATE (SENOKOT-S) 8.6-50 MG TABLET    Take 1 tablet by mouth at bedtime.   SPIRONOLACTONE (ALDACTONE) 25 MG TABLET    Take 0.5 tablets (12.5 mg total) by mouth daily.  Modified Medications   No medications on file  Discontinued Medications   No medications on file    Review of Systems  Constitutional: Negative for fever, chills, diaphoresis, activity change, appetite change, fatigue and unexpected weight change.  HENT: Negative for congestion, ear discharge, ear pain, hearing loss, postnasal drip, rhinorrhea, sore throat, tinnitus, trouble swallowing and voice change.   Eyes: Negative for pain, redness, itching and visual disturbance.  Respiratory: Negative for cough, choking, shortness of breath and wheezing.        O2 sat falls with exertion  in PT.  Cardiovascular: Positive for leg swelling. Negative for chest pain and palpitations.       Chronic valvular heart disease. Chronic atrial fibrillation. Chronic diastolic heart failure.  Gastrointestinal: Negative for nausea, abdominal pain, diarrhea, constipation and abdominal distention.  Endocrine: Negative for cold intolerance, heat intolerance, polydipsia, polyphagia and polyuria.       Hyponatremia noted 03/18/2015. Compensated hypothyroidism.  Genitourinary: Negative for dysuria, urgency, frequency, hematuria, flank pain, vaginal discharge, difficulty urinating and pelvic pain.  Musculoskeletal: Positive for gait problem. Negative for myalgias, back pain, arthralgias, neck pain and neck stiffness.  Skin: Negative for color change, pallor and rash.  Allergic/Immunologic: Negative.   Neurological: Negative for dizziness, tremors, seizures, syncope, weakness, numbness and headaches.       Briefly encephalopathic 03/18/2015 through 03/21/2015. Brain scans have showed cerebral atrophy and small vessel cerebrovascular disease.  Hematological: Negative for adenopathy. Does not bruise/bleed easily.    Psychiatric/Behavioral: Negative for suicidal ideas, hallucinations, behavioral problems, confusion, sleep disturbance, dysphoric mood and agitation. The patient is not nervous/anxious and is not hyperactive.     Filed Vitals:   03/27/15 1657  BP: 120/70  Pulse: 80  Temp: 98.6 F (37 C)  Resp: 14  Height: _0  (1.575 m)  Weight: 104 lb (47.174 kg)   Body mass index is 19.02 kg/(m^2).  Physical Exam  Constitutional: She is oriented to person, place, and time. She appears well-developed and well-nourished. No distress.  THIN  HENT:  Right Ear: External ear normal.  Left Ear: External ear normal.  Nose: Nose normal.  Mouth/Throat: Oropharynx is clear and moist. No oropharyngeal exudate.  Eyes: Conjunctivae and EOM are normal. Pupils are equal, round, and reactive to light. No scleral icterus.  CORRECTIVE LENSES  Neck: No JVD present. No tracheal deviation present. No thyromegaly present.  Cardiovascular: Normal rate and intact distal pulses.  Exam reveals no gallop and no friction rub.   Murmur (2/6/SEM) heard. AF.Patient is a Restaurant manager, fast food. She refuses anticoagulation due to concerns about bleedign and heer religious cmmitment to avoid transfusions.  Pulmonary/Chest: Effort normal. No respiratory distress. She has no wheezes. She has no rales. She exhibits no tenderness.  Abdominal: She exhibits no distension and no mass. There is no tenderness.  Musculoskeletal: Normal range of motion. She exhibits edema (3+ bipedal). She exhibits no tenderness.  Unstable gait. Using walker.  Lymphadenopathy:    She has no cervical adenopathy.  Neurological: She is alert and oriented to person, place, and time. No cranial nerve deficit. Coordination normal.  Skin: No rash noted. She is not diaphoretic. No erythema. No pallor.  Psychiatric: She has a normal mood and affect. Her behavior is normal. Judgment and thought content normal.    Labs reviewed: Lab Summary Latest Ref Rng 03/21/2015  03/20/2015 03/19/2015 03/19/2015 03/19/2015 03/19/2015  Hemoglobin 12.0 - 15.0 g/dL 13.0 11.9(L) (None) (None) (None) 12.0  Hematocrit 36.0 - 46.0 % 40.5 36.6 (None) (None) (None) 36.4  White count 4.0 - 10.5 K/uL 7.1 6.8 (None) (None) (None) 6.4  Platelet count 150 - 400 K/uL 174 157 (None) (None) (None) 167  Sodium 135 - 145 mmol/L 136 131(L) 127(L) 129(L) 126(L) 123(L)  Potassium 3.5 - 5.1 mmol/L 4.9 3.3(L) 3.4(L) 3.4(L) 4.0 3.9  Calcium 8.9 - 10.3 mg/dL 9.3 8.8(L) 8.7(L) 9.2 9.0 8.8(L)  Phosphorus 2.5 - 4.6 mg/dL 2.9 (None) (None) 3.1 (None) (None)  Creatinine 0.44 - 1.00 mg/dL 1.64(H) 1.71(H) 1.78(H) 1.49(H) 1.53(H) 1.40(H)  AST 15 - 41 U/L (None) (None) (None)  44(H) (None) 51(H)  Alk Phos 38 - 126 U/L (None) (None) (None) 83 (None) 81  Bilirubin 0.3 - 1.2 mg/dL (None) (None) (None) 1.2 (None) 1.1  Glucose 65 - 99 mg/dL 107(H) 110(H) 117(H) 86 101(H) 79  Cholesterol - (None) (None) (None) (None) (None) (None)  HDL cholesterol - (None) (None) (None) (None) (None) (None)  Triglycerides - (None) (None) (None) (None) (None) (None)  LDL Direct - (None) (None) (None) (None) (None) (None)  LDL Calc - (None) (None) (None) (None) (None) (None)  Total protein 6.5 - 8.1 g/dL (None) (None) (None) 6.0(L) (None) 5.7(L)  Albumin 3.5 - 5.0 g/dL 3.3(L) (None) (None) 3.4(L) (None) 3.3(L)   Lab Results  Component Value Date   BUN 48* 03/21/2015   No results found for: HGBA1C Lab Results  Component Value Date   TSH 2.889 03/18/2015     Dg Chest 2 View  03/18/2015  CLINICAL DATA:  Shortness of breath several days, weakness, history of CHF, valvular heart disease, pulmonary hypertension, previous history of tobacco use. EXAM: CHEST  2 VIEW COMPARISON:  PA and lateral chest x-ray of June 07, 2014 FINDINGS: The lungs remain hyperinflated with hemidiaphragm flattening and increased AP dimension of the thorax. There is minimal density projecting over the right lateral costophrenic gutter. The cardiac  silhouette remains enlarged. The pulmonary vascularity is not engorged. There is no significant pleural effusion. There is prominent thoracic kyphosis and there is mild levocurvature of the thoracolumbar junction. IMPRESSION: COPD and cardiomegaly. There is no pulmonary edema or alveolar pneumonia. Subsegmental atelectasis is present laterally overlying right cardiophrenic angle. Electronically Signed   By: David  Martinique M.D.   On: 03/18/2015 14:37   Ct Head Wo Contrast  03/18/2015  CLINICAL DATA:  Acute encephalopathy.  Altered mental status. EXAM: CT HEAD WITHOUT CONTRAST TECHNIQUE: Contiguous axial images were obtained from the base of the skull through the vertex without intravenous contrast. COMPARISON:  05/29/2006 FINDINGS: There is no intracranial hemorrhage, mass or evidence of acute infarction. There is mild generalized atrophy. There is mild chronic microvascular ischemic change. There is no significant extra-axial fluid collection. No acute intracranial findings are evident. The visible paranasal sinuses are clear. Mild chronic sclerosis of the right mastoid air cells. IMPRESSION: Mild generalized atrophy.  No acute intracranial findings. Electronically Signed   By: Andreas Newport M.D.   On: 03/18/2015 23:26     Assessment/Plan  1. Hyponatremia Continue to monitor lab -CMP, future  2. Debility Indication physical therapy and occupational therapy.  3. Essential hypertension Controlled  4. Chronic a-fib ([facility]) Rate controlled. Anticoagulation with aspirin. 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 She refuses anticoagulation due to her concerns about bleeding.  5. Chronic diastolic heart failure ([place]) Compensated  6. Chronic kidney disease, stage III (moderate) Stable  7. Edema, unspecified type Right-sided heart failure with 3+ bipedal edema. She should be wearing compression hosiery. She is agreeable to this.  8. Hypothyroidism,  unspecified hypothyroidism type Compensated  9. SOB (shortness of breath) Patient may need oxygen supplementation during periods of exercise.

## 2015-03-30 ENCOUNTER — Encounter: Payer: Self-pay | Admitting: Internal Medicine

## 2015-03-30 DIAGNOSIS — K219 Gastro-esophageal reflux disease without esophagitis: Secondary | ICD-10-CM | POA: Insufficient documentation

## 2015-03-30 DIAGNOSIS — R32 Unspecified urinary incontinence: Secondary | ICD-10-CM | POA: Insufficient documentation

## 2015-03-30 DIAGNOSIS — R5381 Other malaise: Secondary | ICD-10-CM | POA: Insufficient documentation

## 2015-03-30 LAB — HEPATIC FUNCTION PANEL
ALT: 20 U/L (ref 7–35)
AST: 25 U/L (ref 13–35)
Alkaline Phosphatase: 86 U/L (ref 25–125)
Bilirubin, Total: 0.8 mg/dL

## 2015-04-01 LAB — BASIC METABOLIC PANEL
BUN: 71 mg/dL — AB (ref 4–21)
CREATININE: 1.7 mg/dL — AB (ref 0.5–1.1)
Glucose: 102 mg/dL
POTASSIUM: 3.9 mmol/L (ref 3.4–5.3)
SODIUM: 137 mmol/L (ref 137–147)

## 2015-04-08 ENCOUNTER — Encounter: Payer: Self-pay | Admitting: *Deleted

## 2015-04-10 ENCOUNTER — Non-Acute Institutional Stay (SKILLED_NURSING_FACILITY): Payer: Medicare Other | Admitting: Adult Health

## 2015-04-10 ENCOUNTER — Telehealth: Payer: Self-pay | Admitting: Cardiology

## 2015-04-10 DIAGNOSIS — N183 Chronic kidney disease, stage 3 unspecified: Secondary | ICD-10-CM

## 2015-04-10 DIAGNOSIS — K5900 Constipation, unspecified: Secondary | ICD-10-CM | POA: Diagnosis not present

## 2015-04-10 DIAGNOSIS — I5032 Chronic diastolic (congestive) heart failure: Secondary | ICD-10-CM

## 2015-04-10 DIAGNOSIS — I10 Essential (primary) hypertension: Secondary | ICD-10-CM

## 2015-04-10 DIAGNOSIS — R609 Edema, unspecified: Secondary | ICD-10-CM | POA: Diagnosis not present

## 2015-04-10 DIAGNOSIS — E871 Hypo-osmolality and hyponatremia: Secondary | ICD-10-CM

## 2015-04-10 DIAGNOSIS — I482 Chronic atrial fibrillation, unspecified: Secondary | ICD-10-CM

## 2015-04-10 DIAGNOSIS — E039 Hypothyroidism, unspecified: Secondary | ICD-10-CM

## 2015-04-10 DIAGNOSIS — I38 Endocarditis, valve unspecified: Secondary | ICD-10-CM

## 2015-04-10 DIAGNOSIS — R5381 Other malaise: Secondary | ICD-10-CM | POA: Diagnosis not present

## 2015-04-10 NOTE — Telephone Encounter (Signed)
Spoke with patient and she was returning a call from when she had labs prior to recent hospitalization  Today is first day home from facility (was d/c from hospital to facility)  Did schedule follow up ov

## 2015-04-10 NOTE — Telephone Encounter (Signed)
Follow Up ° °Pt returning call from earlier. Please call. °

## 2015-04-12 ENCOUNTER — Encounter: Payer: Self-pay | Admitting: Adult Health

## 2015-04-12 NOTE — Progress Notes (Signed)
Patient ID: [name] [name], female   DOB: [birth year], 81 y.o.   MRN: [id number]    DATE:  04/10/15  MRN:  [id number]  BIRTHDAY: [birth year]  Facility:  Nursing Home Location:  [place] and [place] Room Number: 1202-P  LEVEL OF CARE:  SNF [phone])  Contact Information    Name Relation Home Work Mobile   Hiltz,Katherine Sister [phone]  [phone]   Precious Gilding   [phone]   Elna Breslow   [phone]      Chief Complaint  Patient presents with  . Discharge Note    Physical deconditioning, hyponatremia, edema, atrial fibrillation, valvular disease, chronic kidney disease stage III, hypothyroidism, hypertension and constipation    HISTORY OF PRESENT ILLNESS:  This is an 81 year old female who is for discharge home with home health PT, OT and Nursing. DME:  Rollator walker, oxygen at 2 L continuous via nasal cannula, stationary and portable (with gas). She has been admitted to [facility] on 03/21/15 from [facility]. She has PMH of atrial fibrillation, hypertension, mild to moderate aortic insufficiency, moderate to severe mitral regurgitation, severe tricuspid regurgitation with markedly biatrial enlargement, hypothyroidism and osteoarthritis. She was having a 2 week history of worsening SOB and a 2 day history of hallucinations. She went to her PCP and was sent to the ED. Sodium was 120, chloride 77, BUN 33 and creatinine 1.37. Temperature was 94.3. She was diagnosed with severe acute on chronic hyponatremia. Serum osmolality was low at 266. Urine osmolality was low at 262, specific gravity was less than 1.005. Urine sodium was 56 and urine creatinine was 19.95. Nephrology was consulted and was started on Tolvaptan, fluid restriction and diuresis. Olvaptan was discontued and IV Lasix was transition to oral. Sodium at discharge was 136.  She has been refusing Lovenox injections for the past 3 days and verbalized that she will ask her  PCP to discontinue it.  Patient was admitted to this facility for short-term rehabilitation after the patient's recent hospitalization.  Patient has completed SNF rehabilitation and therapy has cleared the patient for discharge.   PAST MEDICAL HISTORY:  Past Medical History  Diagnosis Date  . Mitral valve disease   . Mitral regurgitation     to   . Hypothyroidism   . B12 deficiency   . Heart murmur     Multi valvular heart disease involving aortic, tricuspid, and mitral valves.  . OA (osteoarthritis)   . Migraines   . Atrial fibrillation ([place])     pt declines anticoag (jehovah's witness)  . Cardiomegaly     on CT scan 03/2011  . Tricuspid regurgitation     Moderate to severe  . Chronic diastolic (congestive) heart failure ([place])   . Hypertension   . GERD (gastroesophageal reflux disease)   . Pulmonary hypertension ([place])   . Aortic valve regurgitation   . CKD (chronic kidney disease) stage 3, GFR 30-59 ml/min   . Dyspnea   . Edema   . Unstable gait   . Cerebral atrophy   . COPD (chronic obstructive pulmonary disease) ([place])   . Cerebrovascular disease     Small vessel disease  . Urinary incontinence      CURRENT MEDICATIONS: Reviewed  Patient's Medications  New Prescriptions   No medications on file  Previous Medications   ASPIRIN 81 MG TABLET    Take 81 mg by mouth. Mondays, Wednesdays, and Fridays   BUMETANIDE (BUMEX) 1 MG TABLET    Take 1 mg by  mouth 2 (two) times daily.   CARTIA XT 120 MG 24 HR CAPSULE    TAKE 1 CAPSULE BY MOUTH DAILY   LEVOTHYROXINE (SYNTHROID, LEVOTHROID) 50 MCG TABLET    TAKE ONE TABLET BY MOUTH ONE TIME DAILY   METOPROLOL SUCCINATE (TOPROL-XL) 25 MG 24 HR TABLET    Take 1 tablet (25 mg total) by mouth at bedtime.   SENNA-DOCUSATE (SENOKOT-S) 8.6-50 MG TABLET    Take 1 tablet by mouth at bedtime.   SPIRONOLACTONE (ALDACTONE) 25 MG TABLET    Take 25 mg by mouth daily.  Modified Medications   No medications on file  Discontinued Medications    ENOXAPARIN (LOVENOX) 30 MG/0.3ML INJECTION    Inject 0.3 mLs (30 mg total) into the skin daily.   SPIRONOLACTONE (ALDACTONE) 25 MG TABLET    Take 0.5 tablets (12.5 mg total) by mouth daily.     Allergies  Allergen Reactions  . Amlodipine Other (See Comments)    Pain in jaw, and peripheral edema  . Other Other (See Comments)    PT IS OF [place]. NO BLOOD PRODUCTS.  . Ramipril Other (See Comments)    Dyspnea - Tolerates lisinopril  . Cozaar Other (See Comments)    insomnia  . Edecrin [Ethacrynic Acid] Nausea And Vomiting  . Hctz [Hydrochlorothiazide] Rash    Takes Triamterene/HCTZ at home.  . Sulfa Antibiotics Itching     REVIEW OF SYSTEMS:  GENERAL: no change in appetite, no fatigue, no weight changes, no fever, chills or weakness EYES: Denies change in vision, dry eyes, eye pain, itching or discharge EARS: Denies change in hearing, ringing in ears, or earache NOSE: Denies nasal congestion or epistaxis MOUTH and THROAT: Denies oral discomfort, gingival pain or bleeding, pain from teeth or hoarseness   RESPIRATORY: no cough, SOB, DOE, wheezing, hemoptysis CARDIAC: no chest pain, or palpitations GI: no abdominal pain, diarrhea, constipation, heart burn, nausea or vomiting GU: Denies dysuria, frequency, hematuria, incontinence, or discharge PSYCHIATRIC: Denies feeling of depression or anxiety. No report of hallucinations, insomnia, paranoia, or agitation    PHYSICAL EXAMINATION  GENERAL APPEARANCE: Well nourished. In no acute distress. Normal body habitus HEAD: Normal in size and contour. No evidence of trauma EYES: Lids open and close normally. No blepharitis, entropion or ectropion. PERRL. Conjunctivae are clear and sclerae are white. Lenses are without opacity EARS: Pinnae are normal. Patient hears normal voice tunes of the examiner MOUTH and THROAT: Lips are without lesions. Oral mucosa is moist and without lesions. Tongue is normal in shape, size, and color  and without lesions NECK: supple, trachea midline, no neck masses, no thyroid tenderness, no thyromegaly LYMPHATICS: no LAN in the neck, no supraclavicular LAN RESPIRATORY: breathing is even & unlabored, BS CTAB CARDIAC: RRR, no murmur,no extra heart sounds, BLE edema 3+ GI: abdomen soft, normal BS, no masses, no tenderness, no hepatomegaly, no splenomegaly EXTREMITIES:  Able to move X 4 extremities PSYCHIATRIC: Alert and oriented X 3. Affect and behavior are appropriate  LABS/RADIOLOGY: Labs reviewed: 04/01/15  sodium 137 potassium 3.9 glucose 102 BUN 71 creatinine 1.74 calcium 9.7 03/30/15  sodium 137 potassium 3.7 glucose 110 BUN 65 creatinine 1.75 calcium 9.6 Basic Metabolic Panel:  Recent Labs  03/18/15 2342  03/19/15 1427 03/19/15 2030 03/20/15 0322 03/21/15 0420 04/01/15  NA 122*  < > 129* 127* 131* 136 137  K 3.6  < > 3.4* 3.4* 3.3* 4.9 3.9  CL 77*  < > 84* 82* 85* 90*  --  CO2 34*  < > 34* 35* 35* 33*  --   GLUCOSE 137*  < > 86 117* 110* 107*  --   BUN 33*  < > 39* 46* 46* 48* 71*  CREATININE 1.46*  < > 1.49* 1.78* 1.71* 1.64* 1.7*  CALCIUM 9.1  < > 9.2 8.7* 8.8* 9.3  --   MG 1.8  --   --   --  1.7 2.2  --   PHOS  --   --  3.1  --   --  2.9  --   < > = values in this interval not displayed.  Liver Function Tests:  Recent Labs  03/19/15 0320 03/19/15 1427 03/21/15 0420 03/30/15  AST 51* 44*  --  25  ALT 21 22  --  20  ALKPHOS 81 83  --  86  BILITOT 1.1 1.2  --   --   PROT 5.7* 6.0*  --   --   ALBUMIN 3.3* 3.4* 3.3*  --    CBC:  Recent Labs  06/07/14 2207  03/19/15 0320 03/20/15 0322 03/21/15 0420 03/24/15  WBC 7.1  < > 6.4 6.8 7.1 5.9  NEUTROABS 6.0  --   --   --   --   --   HGB 14.8  < > 12.0 11.9* 13.0 12.0  HCT 45.1  < > 36.4 36.6 40.5 39  MCV 90.7  < > 82.5 83.9 85.6  --   PLT 268  < > 167 157 174 193  < > = values in this interval not displayed.  Cardiac Enzymes:  Recent Labs  03/18/15 1935 03/18/15 2342 03/19/15 0545  TROPONINI  0.08* 0.08* 0.10*   CBG:  Recent Labs  03/21/15 0718  GLUCAP 102*    Dg Chest 2 View  03/18/2015  CLINICAL DATA:  Shortness of breath several days, weakness, history of CHF, valvular heart disease, pulmonary hypertension, previous history of tobacco use. EXAM: CHEST  2 VIEW COMPARISON:  PA and lateral chest x-ray of June 07, 2014 FINDINGS: The lungs remain hyperinflated with hemidiaphragm flattening and increased AP dimension of the thorax. There is minimal density projecting over the right lateral costophrenic gutter. The cardiac silhouette remains enlarged. The pulmonary vascularity is not engorged. There is no significant pleural effusion. There is prominent thoracic kyphosis and there is mild levocurvature of the thoracolumbar junction. IMPRESSION: COPD and cardiomegaly. There is no pulmonary edema or alveolar pneumonia. Subsegmental atelectasis is present laterally overlying right cardiophrenic angle. Electronically Signed   By: David  Martinique M.D.   On: 03/18/2015 14:37   Ct Head Wo Contrast  03/18/2015  CLINICAL DATA:  Acute encephalopathy.  Altered mental status. EXAM: CT HEAD WITHOUT CONTRAST TECHNIQUE: Contiguous axial images were obtained from the base of the skull through the vertex without intravenous contrast. COMPARISON:  05/29/2006 FINDINGS: There is no intracranial hemorrhage, mass or evidence of acute infarction. There is mild generalized atrophy. There is mild chronic microvascular ischemic change. There is no significant extra-axial fluid collection. No acute intracranial findings are evident. The visible paranasal sinuses are clear. Mild chronic sclerosis of the right mastoid air cells. IMPRESSION: Mild generalized atrophy.  No acute intracranial findings. Electronically Signed   By: Andreas Newport M.D.   On: 03/18/2015 23:26    ASSESSMENT/PLAN:  Physical deconditioning - for home health PT, OT and Nursing .   Chronic hyponatremia - sodium 137; stable  Chronic  diastolic CHF - continue Bumex 1 mg by mouth twice a day and  increase Spironolactone to 25 mg by mouth daily; cardiology follow-up ; continue fluid restriction 1200 mL/day  Chronic atrial fibrillation - rate controlled; continue Cardizem, metoprolol and aspirin  Multi-valvular disease - patient not considered an operative candidate secondary to frailty and refusal of blood products due to Jehovah's Witness  Severe tricuspid valvular regurgitation - continue aspirin, Cardizem, metoprolol and Lasix  Chronic kidney disease stage III - creatinine 1.64; stable  Hypothyroidism - TSH 2.889; continue Synthroid 50 g 1 tab by mouth daily  Hypertension - continue Cardizem 120 mg 24 hour CD 1 capsule by mouth daily and metoprolol succinate 25 mg 1 tab by mouth every at bedtime  Constipation - continue senna S8.6-50 mg 1 tab by mouth daily at bedtime   Edema of BLE - encourage to elevate BLE @ night and when resting, teds stockings in AM and Off @ hs     I have filled out patient's discharge paperwork and written prescriptions.  Patient will receive home health PT, OT and Nursing.  DME provided:  Rollator walker, oxygen at 2 L continuous via nasal cannula, stationary and portable (with gas)  Total discharge time: Greater than 30 minutes  Discharge time involved coordination of the discharge process with Education officer, museum, nursing staff and therapy department. Medical justification for home health services/DME verified.    [facility], NP [employer] [phone]

## 2015-04-12 NOTE — Progress Notes (Signed)
Patient ID: [name] [name], female   DOB: 1929/12/17, 81 y.o.   MRN: [id number]    DATE:  03/23/15  MRN:  [id number]  BIRTHDAY: 13-Aug-1929  Facility:  Nursing Home Location:  [place] and [place] Room Number: 1202-p  LEVEL OF CARE:  SNF [phone])  Contact Information    Name Relation Home Work Mobile   Arntson,Katherine Sister [phone]  [phone]   Precious Gilding   [phone]   Elna Breslow   [phone]      Chief Complaint  Patient presents with  . Hospitalization Follow-up    Physical deconditioning, hyponatremia, metabolic encephalopathy, atrial fibrillation, valvular disease, chronic kidney disease stage III, hypothyroidism, hypertension and constipation    HISTORY OF PRESENT ILLNESS:  This is an 81 year old female who has been admitted to [facility] on 03/21/15 from [facility] She has PMH of atrial fibrillation, hypertension, mild to moderate aortic insufficiency, moderate to severe mitral regurgitation, severe tricuspid regurgitation with markedly biatrial enlargement, hypothyroidism and osteoarthritis. She was having a 2 week history of worsening SOB and a 2 day history of hallucinations. She went to her PCP and was sent to the ED. Sodium was 120, chloride 77, BUN 33 and creatinine 1.37. Temperature was 94.3. She was diagnosed with severe acute on chronic hyponatremia. Serum osmolality was low at 266. Urine osmolality was low at 262, specific gravity was less than 1.005. Urine sodium was 56 and urine creatinine was 19.95. Nephrology was consulted and was started on Tolvaptan, fluid restriction and diuresis. Olvaptan was discontued and IV Lasix was transition to oral. Sodium at discharge was 136.  She has been admitted for a short-term rehabilitation.  PAST MEDICAL HISTORY:  Past Medical History  Diagnosis Date  . Mitral valve disease   . Mitral regurgitation     to   . Hypothyroidism   . B12 deficiency   . Heart  murmur     Multi valvular heart disease involving aortic, tricuspid, and mitral valves.  . OA (osteoarthritis)   . Migraines   . Atrial fibrillation ([place])     pt declines anticoag (jehovah's witness)  . Cardiomegaly     on CT scan 03/2011  . Tricuspid regurgitation     Moderate to severe  . Chronic diastolic (congestive) heart failure ([place])   . Hypertension   . GERD (gastroesophageal reflux disease)   . Pulmonary hypertension ([place])   . Aortic valve regurgitation   . CKD (chronic kidney disease) stage 3, GFR 30-59 ml/min   . Dyspnea   . Edema   . Unstable gait   . Cerebral atrophy   . COPD (chronic obstructive pulmonary disease) ([place])   . Cerebrovascular disease     Small vessel disease  . Urinary incontinence      CURRENT MEDICATIONS: Reviewed  Patient's Medications  New Prescriptions   No medications on file  Previous Medications   ASPIRIN 81 MG TABLET    Take 81 mg by mouth. Mondays, Wednesdays, and Fridays   BUMETANIDE (BUMEX) 1 MG TABLET    Take 1 mg by mouth 2 (two) times daily.   CARTIA XT 120 MG 24 HR CAPSULE    TAKE 1 CAPSULE BY MOUTH DAILY   ENOXAPARIN (LOVENOX) 30 MG/0.3ML INJECTION    Inject 0.3 mLs (30 mg total) into the skin daily.   LEVOTHYROXINE (SYNTHROID, LEVOTHROID) 50 MCG TABLET    TAKE ONE TABLET BY MOUTH ONE TIME DAILY   METOPROLOL SUCCINATE (TOPROL-XL) 25 MG 24 HR TABLET  Take 1 tablet (25 mg total) by mouth at bedtime.   SENNA-DOCUSATE (SENOKOT-S) 8.6-50 MG TABLET    Take 1 tablet by mouth at bedtime.   SPIRONOLACTONE (ALDACTONE) 25 MG TABLET    Take 0.5 tablets (12.5 mg total) by mouth daily.  Modified Medications   No medications on file  Discontinued Medications   No medications on file     Allergies  Allergen Reactions  . Amlodipine Other (See Comments)    Pain in jaw, and peripheral edema  . Other Other (See Comments)    PT IS OF [place]. NO BLOOD PRODUCTS.  . Ramipril Other (See Comments)    Dyspnea - Tolerates  lisinopril  . Cozaar Other (See Comments)    insomnia  . Edecrin [Ethacrynic Acid] Nausea And Vomiting  . Hctz [Hydrochlorothiazide] Rash    Takes Triamterene/HCTZ at home.  . Sulfa Antibiotics Itching     REVIEW OF SYSTEMS:  GENERAL: no change in appetite, no fatigue, no weight changes, no fever, chills or weakness EYES: Denies change in vision, dry eyes, eye pain, itching or discharge EARS: Denies change in hearing, ringing in ears, or earache NOSE: Denies nasal congestion or epistaxis MOUTH and THROAT: Denies oral discomfort, gingival pain or bleeding, pain from teeth or hoarseness   RESPIRATORY: no cough, SOB, DOE, wheezing, hemoptysis CARDIAC: no chest pain, or palpitations GI: no abdominal pain, diarrhea, constipation, heart burn, nausea or vomiting GU: Denies dysuria, frequency, hematuria, incontinence, or discharge PSYCHIATRIC: Denies feeling of depression or anxiety. No report of hallucinations, insomnia, paranoia, or agitation    PHYSICAL EXAMINATION  GENERAL APPEARANCE: Well nourished. In no acute distress. Normal body habitus HEAD: Normal in size and contour. No evidence of trauma EYES: Lids open and close normally. No blepharitis, entropion or ectropion. PERRL. Conjunctivae are clear and sclerae are white. Lenses are without opacity EARS: Pinnae are normal. Patient hears normal voice tunes of the examiner MOUTH and THROAT: Lips are without lesions. Oral mucosa is moist and without lesions. Tongue is normal in shape, size, and color and without lesions NECK: supple, trachea midline, no neck masses, no thyroid tenderness, no thyromegaly LYMPHATICS: no LAN in the neck, no supraclavicular LAN RESPIRATORY: breathing is even & unlabored, BS CTAB CARDIAC: RRR, no murmur,no extra heart sounds, BLE edema 3+ GI: abdomen soft, normal BS, no masses, no tenderness, no hepatomegaly, no splenomegaly EXTREMITIES:  Able to move X 4 extremities PSYCHIATRIC: Alert and oriented X 3.  Affect and behavior are appropriate  LABS/RADIOLOGY: Labs reviewed: Basic Metabolic Panel:  Recent Labs  03/18/15 2342  03/19/15 1427 03/19/15 2030 03/20/15 0322 03/21/15 0420   NA 122*  < > 129* 127* 131* 136   K 3.6  < > 3.4* 3.4* 3.3* 4.9   CL 77*  < > 84* 82* 85* 90*   CO2 34*  < > 34* 35* 35* 33*   GLUCOSE 137*  < > 86 117* 110* 107*   BUN 33*  < > 39* 46* 46* 48*   CREATININE 1.46*  < > 1.49* 1.78* 1.71* 1.64*   CALCIUM 9.1  < > 9.2 8.7* 8.8* 9.3   MG 1.8  --   --   --  1.7 2.2   PHOS  --   --  3.1  --   --  2.9   < > = values in this interval not displayed. Liver Function Tests:  Recent Labs  03/19/15 0320 03/19/15 1427 03/21/15 0420   AST 51* 44*  --  ALT 21 22  --    ALKPHOS 81 83  --    BILITOT 1.1 1.2  --    PROT 5.7* 6.0*  --    ALBUMIN 3.3* 3.4* 3.3*    CBC:  Recent Labs  06/07/14 2207  03/19/15 0320 03/20/15 0322 03/21/15 0420   WBC 7.1  < > 6.4 6.8 7.1   NEUTROABS 6.0  --   --   --   --    HGB 14.8  < > 12.0 11.9* 13.0   HCT 45.1  < > 36.4 36.6 40.5   MCV 90.7  < > 82.5 83.9 85.6   PLT 268  < > 167 157 174    Cardiac Enzymes:  Recent Labs  03/18/15 1935 03/18/15 2342 03/19/15 0545  TROPONINI 0.08* 0.08* 0.10*   CBG:  Recent Labs  03/21/15 0718  GLUCAP 102*     Dg Chest 2 View  03/18/2015  CLINICAL DATA:  Shortness of breath several days, weakness, history of CHF, valvular heart disease, pulmonary hypertension, previous history of tobacco use. EXAM: CHEST  2 VIEW COMPARISON:  PA and lateral chest x-ray of June 07, 2014 FINDINGS: The lungs remain hyperinflated with hemidiaphragm flattening and increased AP dimension of the thorax. There is minimal density projecting over the right lateral costophrenic gutter. The cardiac silhouette remains enlarged. The pulmonary vascularity is not engorged. There is no significant pleural effusion. There is prominent thoracic kyphosis and there is mild levocurvature of the thoracolumbar  junction. IMPRESSION: COPD and cardiomegaly. There is no pulmonary edema or alveolar pneumonia. Subsegmental atelectasis is present laterally overlying right cardiophrenic angle. Electronically Signed   By: David  Martinique M.D.   On: 03/18/2015 14:37   Ct Head Wo Contrast  03/18/2015  CLINICAL DATA:  Acute encephalopathy.  Altered mental status. EXAM: CT HEAD WITHOUT CONTRAST TECHNIQUE: Contiguous axial images were obtained from the base of the skull through the vertex without intravenous contrast. COMPARISON:  05/29/2006 FINDINGS: There is no intracranial hemorrhage, mass or evidence of acute infarction. There is mild generalized atrophy. There is mild chronic microvascular ischemic change. There is no significant extra-axial fluid collection. No acute intracranial findings are evident. The visible paranasal sinuses are clear. Mild chronic sclerosis of the right mastoid air cells. IMPRESSION: Mild generalized atrophy.  No acute intracranial findings. Electronically Signed   By: Andreas Newport M.D.   On: 03/18/2015 23:26    ASSESSMENT/PLAN:  Physical deconditioning - for rehabilitation  Chronic hyponatremia - sodium at discharge is 136; check BMP  Chronic diastolic CHF - continue Bumex 1 mg by mouth twice a day and Spironolactone 12.5 mg by mouth daily; cardiology follow-up in 2 weeks; continue fluid restriction 8182 mL/day  Metabolic encephalopathy - felt to be secondary to hyponatremia; as hyponatremia improved patient has no further hallucinations; check CBC  Chronic atrial fibrillation - rate controlled; continue Cardizem, metoprolol and aspirin  Multi-valvular disease - patient not considered an operative candidate secondary to frailty and refusal of blood products due to Jehovah's Witness  Severe tricuspid valvular regurgitation - continue aspirin, Cardizem, metoprolol and Lasix  Chronic kidney disease stage III - creatinine 1.64; will monitor  Hypothyroidism - TSH 2.889; continue  Synthroid 50 g 1 tab by mouth daily  Hypertension - continue Cardizem 120 mg 24 hour CD 1 capsule by mouth daily and metoprolol succinate 25 mg 1 tab by mouth every at bedtime  Constipation - continue senna S8.6-50 mg 1 tab by mouth daily at bedtime  Goals of care:  Short-term rehabilitation    [facility], NP [place]

## 2015-04-15 ENCOUNTER — Telehealth: Payer: Self-pay | Admitting: Cardiology

## 2015-04-15 NOTE — Telephone Encounter (Signed)
New Message  RN from [facility] calling to have our office contact pt to clarify dosage of aldactone- per RN the dx report had 0.5 tabs 1 x day, whereas the bottle listed 1 tab 1 x day. Please call pt back and discuss.

## 2015-04-15 NOTE — Telephone Encounter (Signed)
Spoke with patient and she has been taking the Spironolactone 25 mg full tablet daily Discussed with  Dr. Mare Ferrari and will continue full dose and keep ov next week  Advised patient

## 2015-04-16 ENCOUNTER — Encounter: Payer: Self-pay | Admitting: Cardiology

## 2015-04-20 ENCOUNTER — Ambulatory Visit (INDEPENDENT_AMBULATORY_CARE_PROVIDER_SITE_OTHER): Payer: Medicare Other | Admitting: Cardiology

## 2015-04-20 ENCOUNTER — Encounter: Payer: Self-pay | Admitting: Cardiology

## 2015-04-20 VITALS — BP 120/72 | HR 69 | Ht 60.0 in | Wt 125.8 lb

## 2015-04-20 DIAGNOSIS — I340 Nonrheumatic mitral (valve) insufficiency: Secondary | ICD-10-CM

## 2015-04-20 DIAGNOSIS — N183 Chronic kidney disease, stage 3 unspecified: Secondary | ICD-10-CM

## 2015-04-20 DIAGNOSIS — I5033 Acute on chronic diastolic (congestive) heart failure: Secondary | ICD-10-CM

## 2015-04-20 DIAGNOSIS — I5032 Chronic diastolic (congestive) heart failure: Secondary | ICD-10-CM | POA: Diagnosis not present

## 2015-04-20 DIAGNOSIS — I272 Other secondary pulmonary hypertension: Secondary | ICD-10-CM

## 2015-04-20 NOTE — Patient Instructions (Signed)
Medication Instructions:  Your physician recommends that you continue on your current medications as directed. Please refer to the Current Medication list given to you today.  Labwork: none  Testing/Procedures: none  Follow-Up: Your physician recommends that you schedule a follow-up appointment in: 2 month ov with  Dr. Mare Ferrari   If you need a refill on your cardiac medications before your next appointment, please call your pharmacy.

## 2015-04-20 NOTE — Progress Notes (Signed)
Cardiology Office Note   Date:  04/21/2015   ID:  [name] [name], DOB 03-09-1930, MRN [id number]  PCP:  [name] Coma, MD  Cardiologist: [name] Coco MD  Chief Complaint  Patient presents with  . Atrial Fibrillation      History of Present Illness: [name] [name] is a 81 y.o. female who presents for 2 month follow-up visit  This pleasant 81 year old woman is seen for followup of her chronic atrial fibrillation. She is a medical patient of Dr. Stephanie [name]. She has a history of chronic atrial fibrillation and essential hypertension. She also has pulmonary hypertension. She is a Restaurant manager, fast food. She refuses anticoagulation for her atrial fibrillation because she is concerned about bleeding. The patient was hospitalized at the end of December 2015 after having severe epistaxis. Her echocardiogram on 06/08/14 showed an ejection fraction of 50-55% with LVH. She was found to have mild to moderate aortic insufficiency, moderate to severe mitral regurgitation, and severe tricuspid regurgitation with pulmonary artery pressure of 64. She also had marked biatrial enlargement. She should be on long-term anticoagulation but she declines anticoagulation. I spoke with her about Eliquis and she does not want to be on anticoagulation. Part of her hesitancy is the fact that she has a concern about blood transfusions because of her religion. On her last hospitalization she had a slight bump in her troponins. She remains on low-dose aspirin 81 mg daily. She has not had any recurrent epistaxis.  The patient was readmitted to the hospital on 03/18/15 with hyponatremia and encephalopathy.  Following discharge she went to a skilled facility and has just recently returned home.  She lives by herself.  Friends from her church providing her with transportation and other assistance. She continues to have shortness of breath.  She is now on home oxygen at 2 L a minute.  Since last visit she has had a  13 pound weight gain with marked lower extremity edema. On 03/19/15 the patient had an echocardiogram showing her ejection fraction to be 50-55% with pulmonary artery pressure of 74, severe tricuspid regurgitation, severe mitral regurgitation, and mild to moderate aortic regurgitation.  Past Medical History  Diagnosis Date  . Mitral valve disease   . Mitral regurgitation     to   . Hypothyroidism   . B12 deficiency   . Heart murmur     Multi valvular heart disease involving aortic, tricuspid, and mitral valves.  . OA (osteoarthritis)   . Migraines   . Atrial fibrillation ([place])     pt declines anticoag (jehovah's witness)  . Cardiomegaly     on CT scan 03/2011  . Tricuspid regurgitation     Moderate to severe  . Chronic diastolic (congestive) heart failure ([place])   . Hypertension   . GERD (gastroesophageal reflux disease)   . Pulmonary hypertension ([place])   . Aortic valve regurgitation   . CKD (chronic kidney disease) stage 3, GFR 30-59 ml/min   . Dyspnea   . Edema   . Unstable gait   . Cerebral atrophy   . COPD (chronic obstructive pulmonary disease) ([place])   . Cerebrovascular disease     Small vessel disease  . Urinary incontinence     Past Surgical History  Procedure Laterality Date  . Tonsillectomy    . Nasal endoscopy with epistaxis control  07/15/2012    Procedure: NASAL ENDOSCOPY WITH EPISTAXIS CONTROL;  Surgeon: [name] Gala, MD;  Location: [facility];  Service: ENT;  Laterality: N/A;     Current Outpatient  Prescriptions  Medication Sig Dispense Refill  . aspirin 81 MG tablet Take 81 mg by mouth. Mondays, Wednesdays, and Fridays    . bumetanide (BUMEX) 1 MG tablet Take 1 mg by mouth 2 (two) times daily.    Marland Kitchen CARTIA XT 120 MG 24 hr capsule TAKE 1 CAPSULE BY MOUTH DAILY 30 capsule 11  . levothyroxine (SYNTHROID, LEVOTHROID) 50 MCG tablet TAKE ONE TABLET BY MOUTH ONE TIME DAILY 30 tablet 5  . metoprolol succinate (TOPROL-XL) 25 MG 24 hr tablet Take 1 tablet (25 mg total) by  mouth at bedtime. 30 tablet 0  . potassium chloride (K-DUR,KLOR-CON) 10 MEQ tablet Take 1 tablet by mouth daily.  0  . senna-docusate (SENOKOT-S) 8.6-50 MG tablet Take 1 tablet by mouth at bedtime.    Marland Kitchen spironolactone (ALDACTONE) 25 MG tablet Take 25 mg by mouth daily.     No current facility-administered medications for this visit.    Allergies:   Amlodipine; Other; Ramipril; Cozaar; Edecrin; Hctz; and Sulfa antibiotics    Social History:  The patient  reports that she has quit smoking. She has never used smokeless tobacco. She reports that she drinks alcohol. She reports that she does not use illicit drugs.   Family History:  The patient's family history includes Arthritis in her father; Hypertension in her mother; Stomach cancer in her father; Stroke in her mother.    ROS:  Please see the history of present illness.   Otherwise, review of systems are positive for none.   All other systems are reviewed and negative.    PHYSICAL EXAM: VS:  BP 120/72 mmHg  Pulse 69  Ht 5' (1.524 m)  Wt 125 lb 12.8 oz (57.063 kg)  BMI 24.57 kg/m2 , BMI Body mass index is 24.57 kg/(m^2). GEN: Well nourished, well developed, in no acute distress HEENT: normal Neck: no JVD, carotid bruits, or masses Cardiac: Irregularly irregular rhythm.  Grade 2/6 apical holosystolic murmur of mitral regurgitation.  4+ pitting edema of lower extremities. Respiratory:  clear to auscultation bilaterally, normal work of breathing.  Patient is using nasal oxygen GI: soft, nontender, nondistended, + BS MS: no deformity or atrophy Skin: warm and dry, no rash Neuro:  Strength and sensation are intact Psych: euthymic mood, full affect   EKG:  EKG is ordered today. The ekg ordered today demonstrates atrial flutter fibrillation with controlled ventricular response.  Right superior axis deviation.  ST and T-wave abnormalities consider lateral ischemia or digitalis effect.  Poor R progression V1 through V4   Recent  Labs: 03/18/2015: TSH 2.889 03/19/2015: B Natriuretic Peptide 1487.0* 03/21/2015: Magnesium 2.2 03/24/2015: Hemoglobin 12.0; Platelets 193 03/30/2015: ALT 20 04/01/2015: BUN 71*; Creatinine 1.7*; Potassium 3.9; Sodium 137    Lipid Panel    Component Value Date/Time   CHOL 190 06/28/2010 1054   TRIG 71.0 06/28/2010 1054   HDL 84.10 06/28/2010 1054   CHOLHDL 2 06/28/2010 1054   VLDL 14.2 06/28/2010 1054   LDLCALC 92 06/28/2010 1054      Wt Readings from Last 3 Encounters:  04/20/15 125 lb 12.8 oz (57.063 kg)  04/10/15 119 lb (53.978 kg)  03/27/15 104 lb (47.174 kg)        ASSESSMENT AND PLAN:  1. permanent atrial fibrillation not on anticoagulation because of patient preference (Jehovah's Witness) 2. multi-valvular heart disease with tricuspid regurgitation, pulmonary hypertension, aortic insufficiency, and mitral regurgitation. 3. Hypertensive heart disease 4. peripheral edema, chronic, secondary to right heart failure.  Pulmonary artery pressure by echo 74  5. normal left ventricular function by echocardiogram 03/19/15 with ejection fraction 50-55% and with LVH. 6. Chronic renal insufficiency 7. Pulsatile hepatomegaly secondary to tricuspid regurgitation   Current medicines are reviewed at length with the patient today.  The patient does not have concerns regarding medicines.  The following changes have been made:  no change  Labs/ tests ordered today include:   Orders Placed This Encounter  Procedures  . EKG 12-Lead     Disposition: We will continue current medication.  Previous labs have shown significant prerenal azotemia.  Despite her massive lower extremity edema, her lungs are clear to auscultation and she is in no respiratory distress.  She will continue on Bumex and spironolactone for diuresis and for metoprolol and diltiazem for rate control.  Overall prognosis is not good.  I talked to her today about considering possible assisted living facility.   However she states she wants to stay in her own home as long she can.  Berna Spare MD 04/21/2015 10:54 AM    [address], [place], [place]  [address] Phone: [phone]; Fax: [phone]

## 2015-05-09 ENCOUNTER — Other Ambulatory Visit: Payer: Self-pay | Admitting: Adult Health

## 2015-05-11 ENCOUNTER — Ambulatory Visit: Payer: Medicare Other | Admitting: Cardiology

## 2015-05-17 ENCOUNTER — Other Ambulatory Visit: Payer: Self-pay | Admitting: Adult Health

## 2015-05-22 ENCOUNTER — Other Ambulatory Visit: Payer: Self-pay | Admitting: Adult Health

## 2015-06-29 ENCOUNTER — Encounter: Payer: Self-pay | Admitting: Cardiology

## 2015-07-02 ENCOUNTER — Ambulatory Visit: Payer: Medicare Other | Admitting: Cardiology

## 2015-07-02 ENCOUNTER — Telehealth: Payer: Self-pay | Admitting: Cardiology

## 2015-07-02 NOTE — Telephone Encounter (Signed)
Hospice nurse called back Patient seems to be declining quickly. She has been in bed all day secondary to weakness and dizziness  Has only eaten a few bites of oatmeal and had a few sips of coffee in am. O2 sat 97%, unable to get blood pressure, and respirations 8. Is having upper extremity and lower extremity edema Discussed with  Dr. Mare Ferrari and patient appropriate for [facility] no beds available today

## 2015-07-02 NOTE — Telephone Encounter (Signed)
New message ° ° ° ° ° °Talk to the nurse °

## 2015-07-02 NOTE — Telephone Encounter (Signed)
Spoke with [facility] nurse this am, patient had 1:45 pm ov today Patient was seen yesterday and did not really get up out of chair during visit.  Blood pressure 98/60, HR 62 irregular, O2 sat 85-88% on 2 L. Did get up O2 up to 92% on 4 L PCP was out of the office so she contacted Hospice MD who advised to hold Cardizem and Metoprolol until ov today Patient had 4+ pitting edema and weeping cellulitis. Patient was given Keflex by PCP but she d/c before completed secondary to nausea and diarrhea. Information left for PCP Patient called in to cancel her appointment secondary to being "too weak" Patient stated she was dizzy and unable to get up and walk around. Had been sitting on the edge of bed all day. She thought she might be dehydrated and that was causing her "hallucinations" When asked about her hallucinations she stated she thought she was doing things and she was not actually doing them. Did ask her if she thought her breathing today was any better than yesterday and she did not know if it was.  Patient denies any current pain, just weak and dizzy.  She said she was not eating or drinking much.  Did advise patient if she was having all of these issues she probably needed to go to ED for evaluation and should not be left alone, call had been placed to Hospice.  Spoke with Cristela Blue and she will be going to visit patient today for follow up.

## 2015-07-03 ENCOUNTER — Telehealth: Payer: Self-pay | Admitting: *Deleted

## 2015-07-03 NOTE — Telephone Encounter (Signed)
Received call from [place] and patient transferred to [facility] today

## 2015-07-03 NOTE — Telephone Encounter (Signed)
Unfortunately that is the best decision regarding her care.

## 2015-07-06 ENCOUNTER — Telehealth: Payer: Self-pay | Admitting: Cardiology

## 2015-07-06 NOTE — Telephone Encounter (Signed)
Will forward to Dr. Mare Ferrari to make him aware.

## 2015-07-06 NOTE — Telephone Encounter (Signed)
Thanks for the update

## 2015-07-06 NOTE — Telephone Encounter (Signed)
New message    Patient passed away at [facility] place on  08/01/2015

## 2015-07-15 DEATH — deceased

## 2015-11-12 IMAGING — DX DG CHEST 2V
2 series · 2 of 2 positions shown · non-contrast
Comparison: PA and lateral chest x-ray June 07, 2014

CLINICAL DATA: Shortness of breath several days, weakness, history
of CHF, valvular heart disease, pulmonary hypertension, previous
history of tobacco use.

EXAM:
CHEST  2 VIEW

[w chest pa]
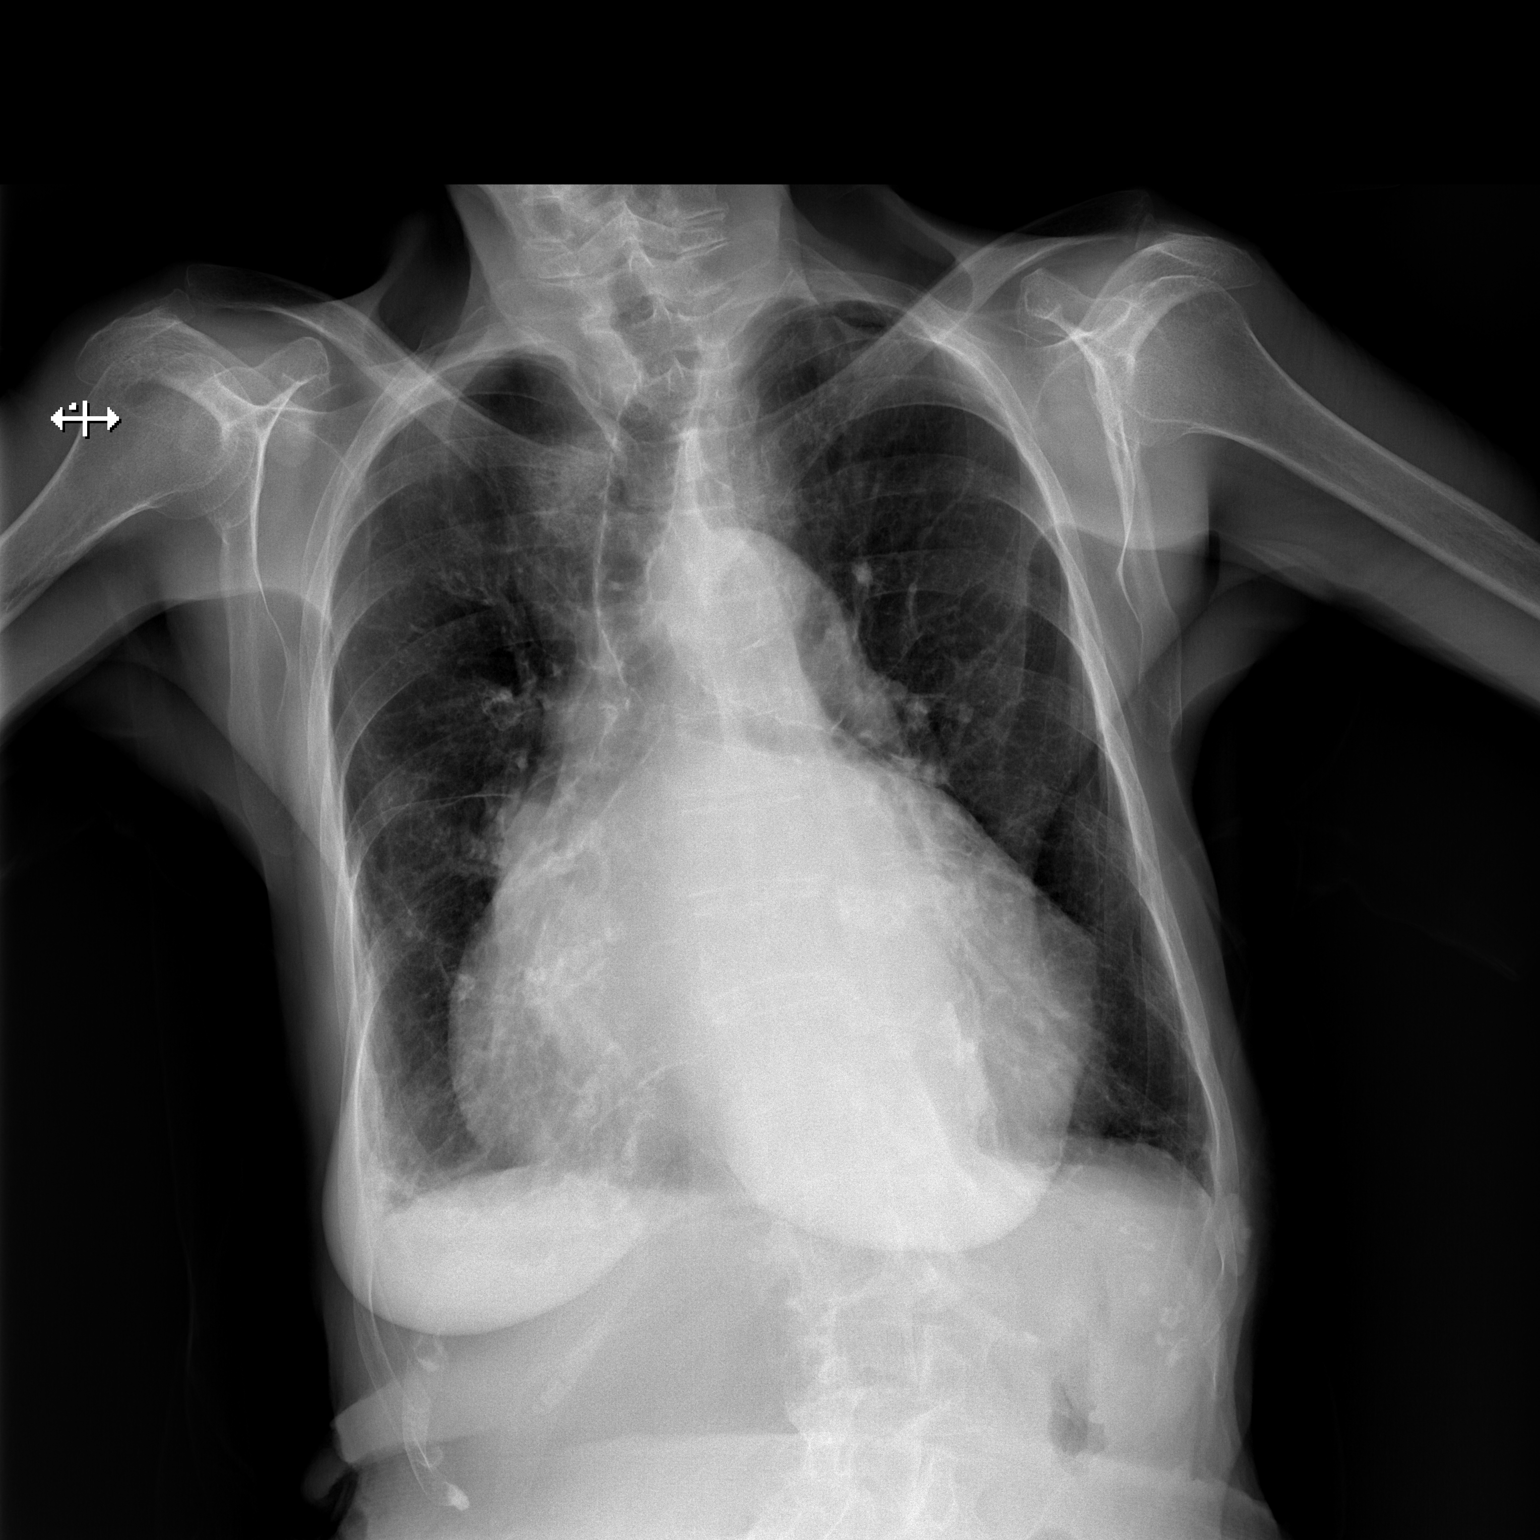

[w chest lat]
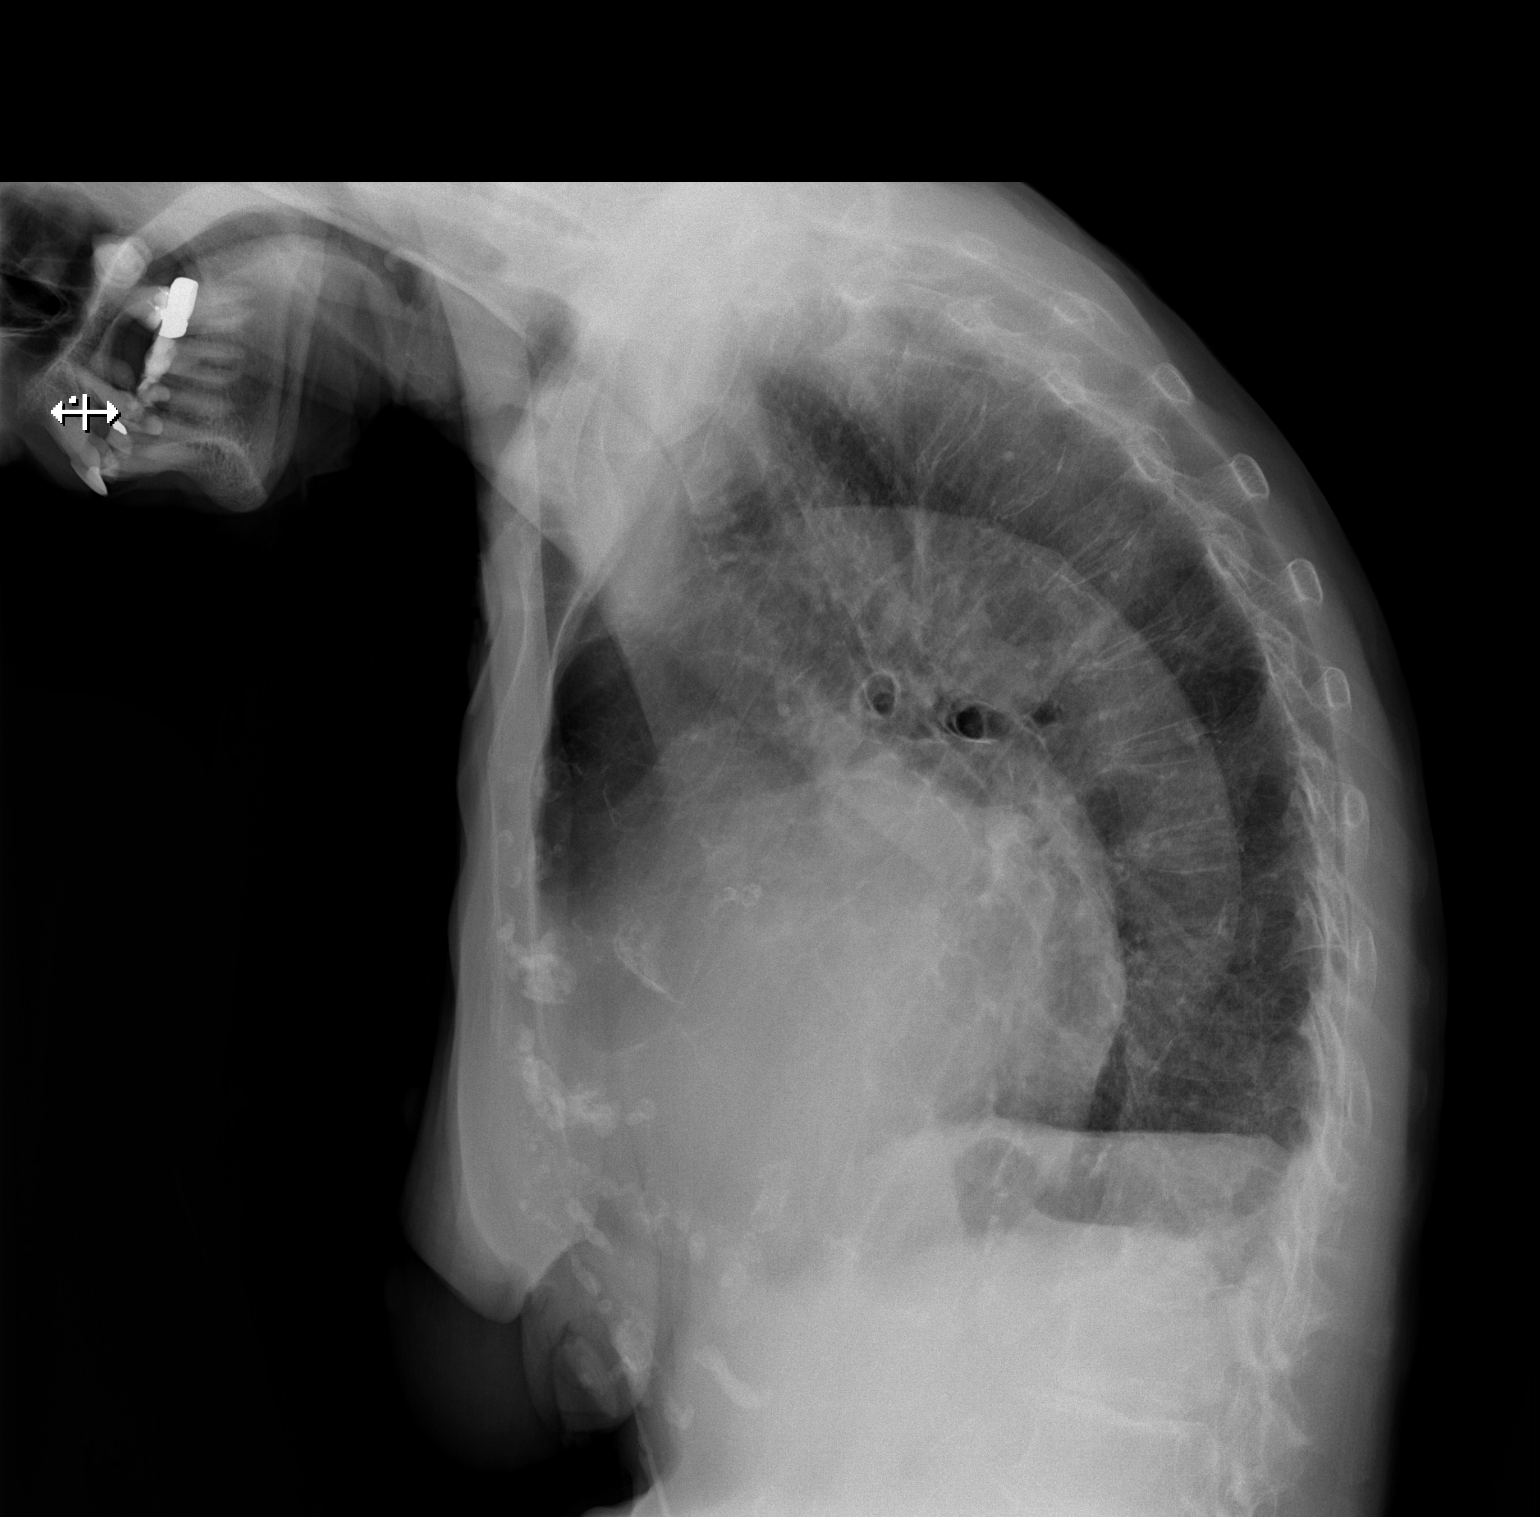

[2 of 2 positions shown; findings below may reference images not displayed]

FINDINGS: The lungs remain hyperinflated with hemidiaphragm flattening and
increased AP dimension of the thorax. There is minimal density
projecting over the right lateral costophrenic gutter. The cardiac
silhouette remains enlarged. The pulmonary vascularity is not
engorged. There is no significant pleural effusion. There is
prominent thoracic kyphosis and there is mild levocurvature of the
thoracolumbar junction.
IMPRESSION: COPD and cardiomegaly. There is no pulmonary edema or alveolar
pneumonia. Subsegmental atelectasis is present laterally overlying
right cardiophrenic angle.

## 2015-11-12 IMAGING — CT CT HEAD W/O CM
2 series · 15 of 30 positions shown, 17 images · non-contrast
Comparison: 05/29/2006

CLINICAL DATA: Acute encephalopathy.  Altered mental status.

EXAM:
CT HEAD WITHOUT CONTRAST
TECHNIQUE: Contiguous axial images were obtained from the base of the skull
through the vertex without intravenous contrast.

[Series 2: head without · axial · non-contrast · 0.41mm/px · z∈[-150,-35]mm · 7 of 31 slices shown, 9 images]
[im 4/31  brain]
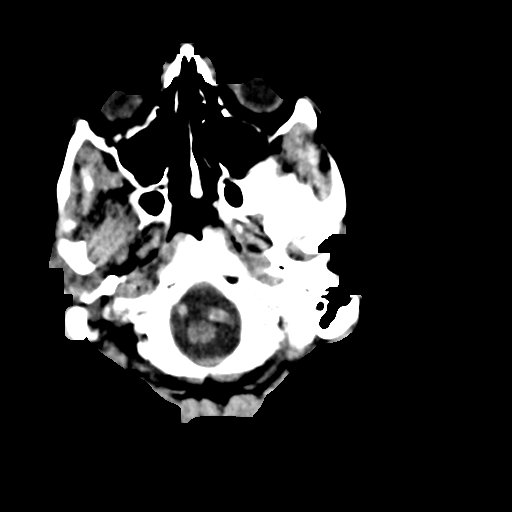
[im 4/31  bone]
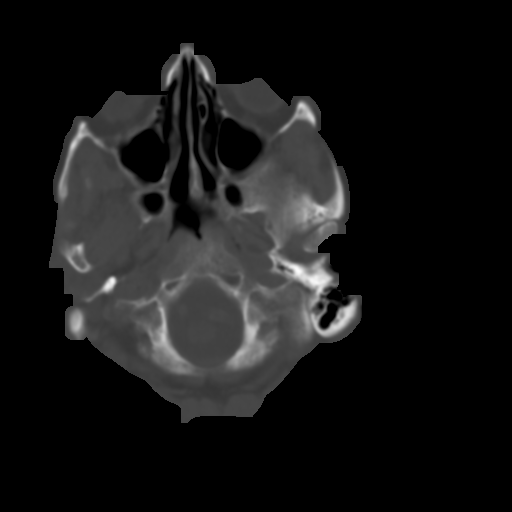
[im 8/31  brain]
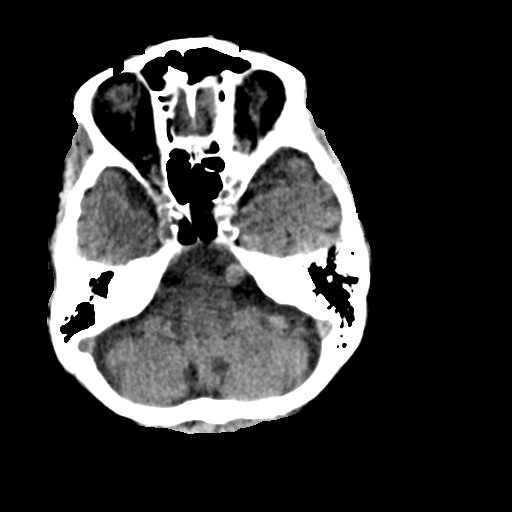
[im 12/31  brain]
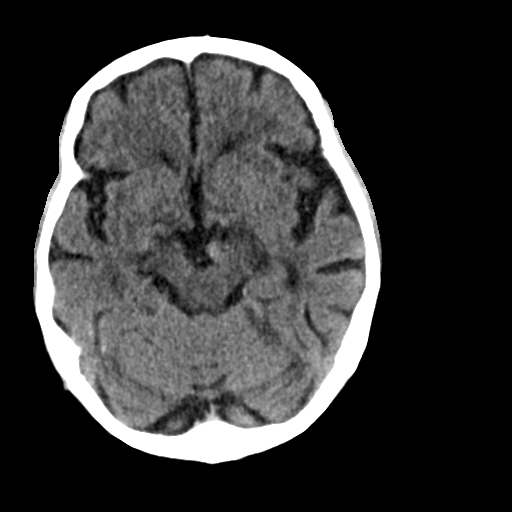
[im 16/31  brain]
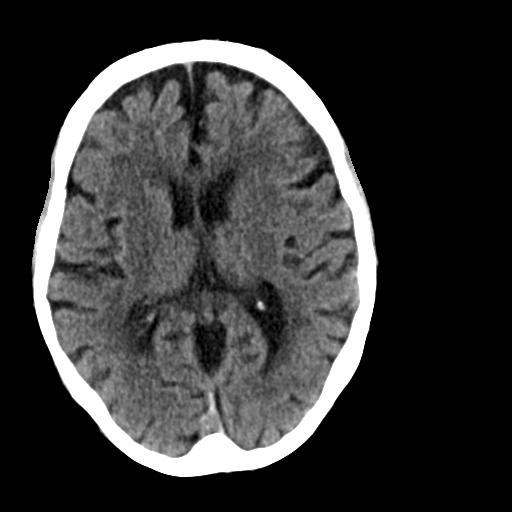
[im 19/31  brain]
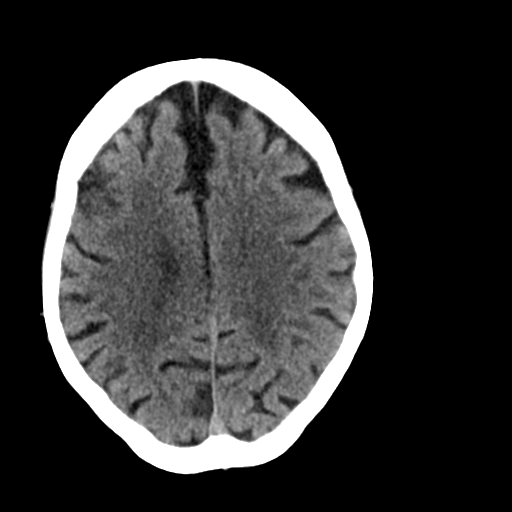
[im 19/31  bone]
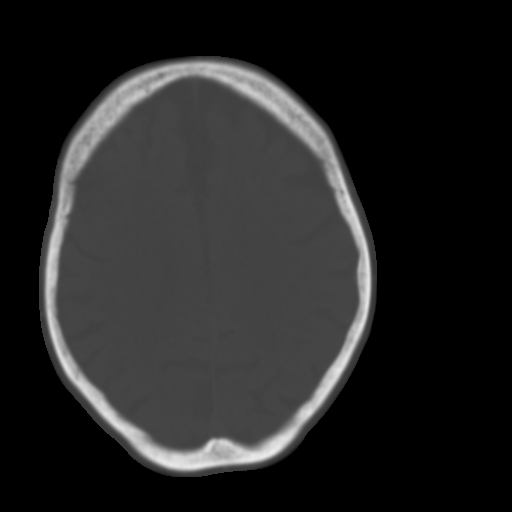
[im 23/31  brain]
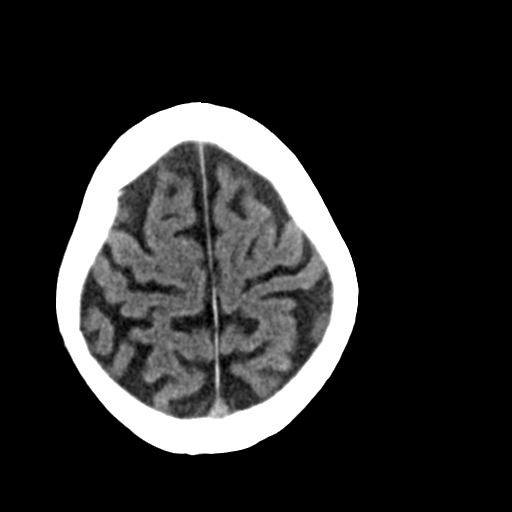
[im 27/31  brain]
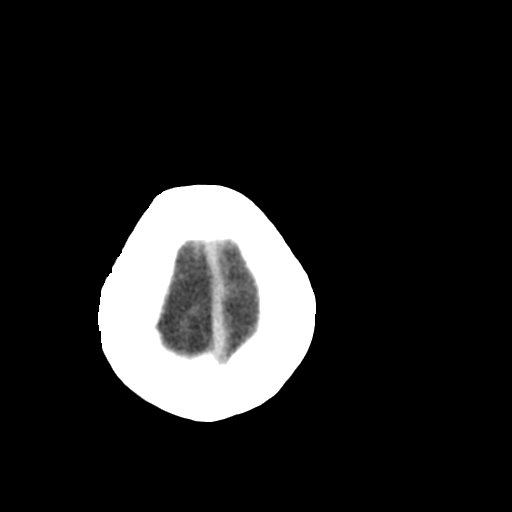

[Series 3: head bone · axial · 0.41mm/px · z∈[-151,-31]mm · 8 of 76 slices shown]
[im 8/76  bone]
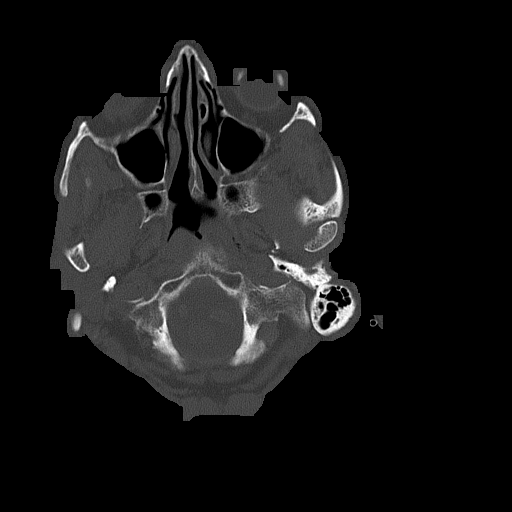
[im 16/76  bone]
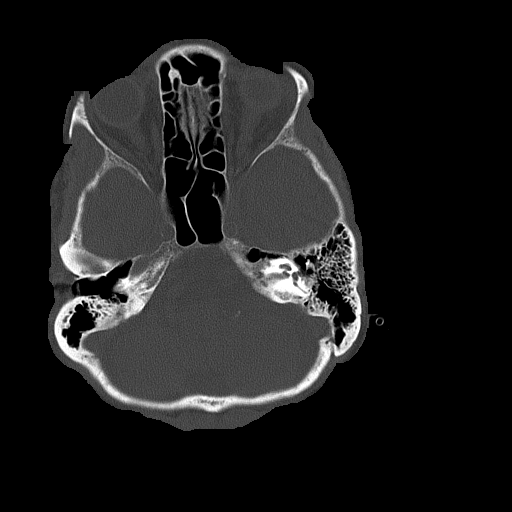
[im 23/76  bone]
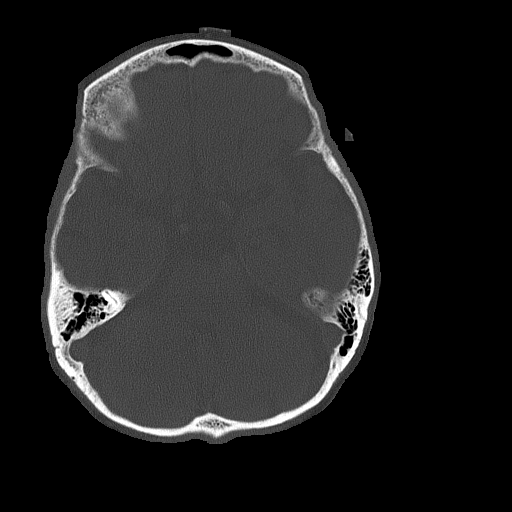
[im 34/76  bone]
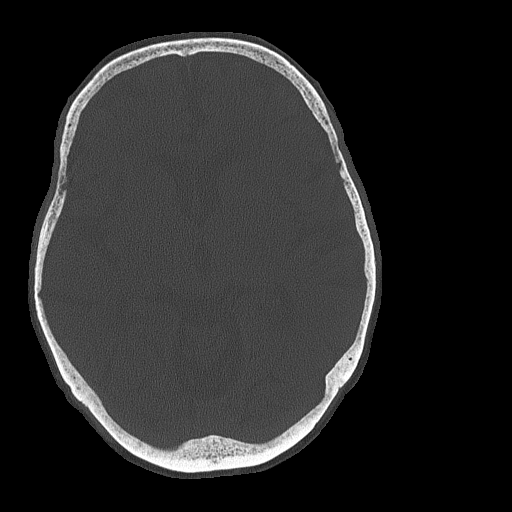
[im 42/76  bone]
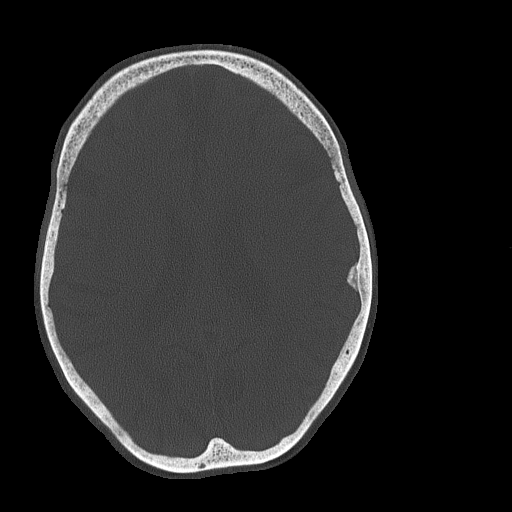
[im 53/76  bone]
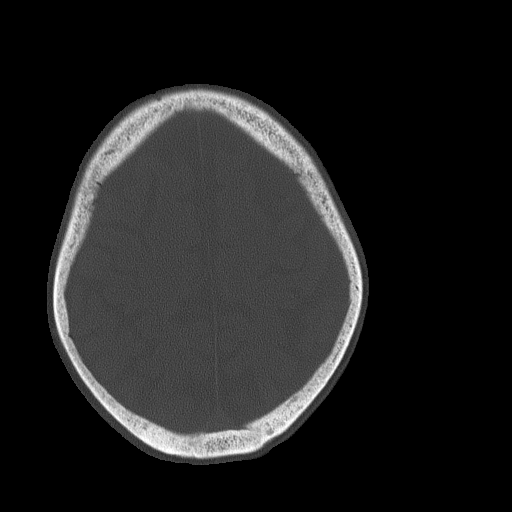
[im 61/76  bone]
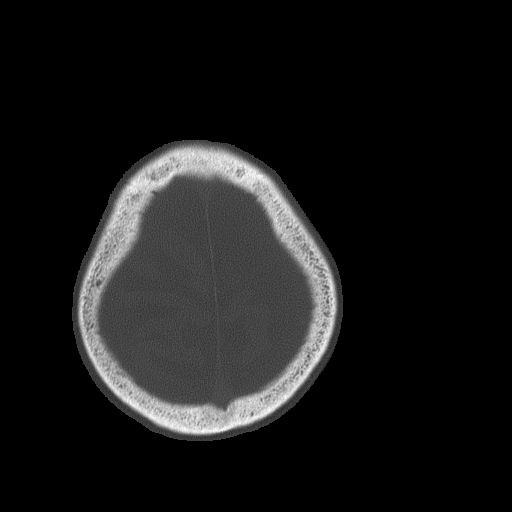
[im 68/76  bone]
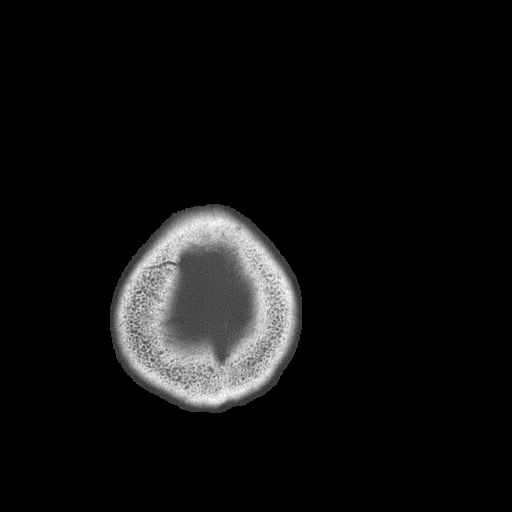

[15 of 30 positions shown; findings below may reference images not displayed]

FINDINGS: There is no intracranial hemorrhage, mass or evidence of acute
infarction. There is mild generalized atrophy. There is mild chronic
microvascular ischemic change. There is no significant extra-axial
fluid collection.

No acute intracranial findings are evident. The visible paranasal
sinuses are clear. Mild chronic sclerosis of the right mastoid air
cells.
IMPRESSION: Mild generalized atrophy.  No acute intracranial findings.
